# Patient Record
Sex: Female | Born: 1955
Health system: Southern US, Community
[De-identification: ages and names within clinical notes are randomized; demographics above are authoritative.]

## PROBLEM LIST (undated history)

## (undated) DIAGNOSIS — T7840XA Allergy, unspecified, initial encounter: Secondary | ICD-10-CM

## (undated) DIAGNOSIS — K5090 Crohn's disease, unspecified, without complications: Secondary | ICD-10-CM

## (undated) DIAGNOSIS — F329 Major depressive disorder, single episode, unspecified: Secondary | ICD-10-CM

## (undated) DIAGNOSIS — C44211 Basal cell carcinoma of skin of unspecified ear and external auricular canal: Secondary | ICD-10-CM

## (undated) DIAGNOSIS — F909 Attention-deficit hyperactivity disorder, unspecified type: Secondary | ICD-10-CM

## (undated) DIAGNOSIS — K529 Noninfective gastroenteritis and colitis, unspecified: Secondary | ICD-10-CM

## (undated) DIAGNOSIS — F32A Depression, unspecified: Secondary | ICD-10-CM

## (undated) DIAGNOSIS — K449 Diaphragmatic hernia without obstruction or gangrene: Secondary | ICD-10-CM

## (undated) HISTORY — PX: COLONOSCOPY: SHX174

## (undated) HISTORY — DX: Crohn's disease, unspecified, without complications: K50.90

## (undated) HISTORY — PX: BREAST EXCISIONAL BIOPSY: SUR124

## (undated) HISTORY — DX: Diaphragmatic hernia without obstruction or gangrene: K44.9

## (undated) HISTORY — PX: ABDOMINAL HYSTERECTOMY: SHX81

## (undated) HISTORY — DX: Basal cell carcinoma of skin of unspecified ear and external auricular canal: C44.211

## (undated) HISTORY — DX: Noninfective gastroenteritis and colitis, unspecified: K52.9

## (undated) HISTORY — PX: BREAST SURGERY: SHX581

## (undated) HISTORY — PX: TONSILLECTOMY: SUR1361

## (undated) HISTORY — DX: Allergy, unspecified, initial encounter: T78.40XA

## (undated) HISTORY — PX: KNEE SURGERY: SHX244

---

## 2011-11-15 DIAGNOSIS — E785 Hyperlipidemia, unspecified: Secondary | ICD-10-CM | POA: Insufficient documentation

## 2011-12-08 DIAGNOSIS — M25562 Pain in left knee: Secondary | ICD-10-CM | POA: Insufficient documentation

## 2012-06-20 DIAGNOSIS — M778 Other enthesopathies, not elsewhere classified: Secondary | ICD-10-CM | POA: Insufficient documentation

## 2012-10-01 DIAGNOSIS — Z9889 Other specified postprocedural states: Secondary | ICD-10-CM | POA: Insufficient documentation

## 2012-12-05 DIAGNOSIS — M792 Neuralgia and neuritis, unspecified: Secondary | ICD-10-CM | POA: Insufficient documentation

## 2012-12-05 DIAGNOSIS — F1190 Opioid use, unspecified, uncomplicated: Secondary | ICD-10-CM | POA: Insufficient documentation

## 2013-05-30 DIAGNOSIS — N3941 Urge incontinence: Secondary | ICD-10-CM | POA: Insufficient documentation

## 2013-11-19 DIAGNOSIS — M25531 Pain in right wrist: Secondary | ICD-10-CM | POA: Insufficient documentation

## 2014-08-04 DIAGNOSIS — M11279 Other chondrocalcinosis, unspecified ankle and foot: Secondary | ICD-10-CM | POA: Insufficient documentation

## 2014-09-01 LAB — HM HEPATITIS C SCREENING LAB: HM Hepatitis Screen: NEGATIVE

## 2014-09-01 LAB — HIV ANTIBODY (ROUTINE TESTING W REFLEX): HIV 1&2 Ab, 4th Generation: NONREACTIVE

## 2015-03-15 ENCOUNTER — Encounter (HOSPITAL_COMMUNITY): Payer: Self-pay | Admitting: Emergency Medicine

## 2015-03-15 ENCOUNTER — Emergency Department (HOSPITAL_COMMUNITY)
Admission: EM | Admit: 2015-03-15 | Discharge: 2015-03-15 | Disposition: A | Discharge status: Home / Self Care | Payer: BLUE CROSS/BLUE SHIELD | Attending: Emergency Medicine | Admitting: Emergency Medicine

## 2015-03-15 DIAGNOSIS — G43909 Migraine, unspecified, not intractable, without status migrainosus: Secondary | ICD-10-CM | POA: Insufficient documentation

## 2015-03-15 DIAGNOSIS — Z720 Tobacco use: Secondary | ICD-10-CM | POA: Insufficient documentation

## 2015-03-15 DIAGNOSIS — Z8659 Personal history of other mental and behavioral disorders: Secondary | ICD-10-CM | POA: Diagnosis not present

## 2015-03-15 DIAGNOSIS — G43009 Migraine without aura, not intractable, without status migrainosus: Secondary | ICD-10-CM

## 2015-03-15 DIAGNOSIS — R51 Headache: Secondary | ICD-10-CM | POA: Diagnosis present

## 2015-03-15 HISTORY — DX: Depression, unspecified: F32.A

## 2015-03-15 HISTORY — DX: Major depressive disorder, single episode, unspecified: F32.9

## 2015-03-15 MED ORDER — KETOROLAC TROMETHAMINE 30 MG/ML IJ SOLN
30.0000 mg | Freq: Once | INTRAMUSCULAR | Status: AC
  Administered 2015-03-15: 30 mg via INTRAVENOUS
  Filled 2015-03-15: qty 1

## 2015-03-15 MED ORDER — METOCLOPRAMIDE HCL 5 MG/ML IJ SOLN
10.0000 mg | Freq: Once | INTRAMUSCULAR | Status: AC
  Administered 2015-03-15: 10 mg via INTRAVENOUS
  Filled 2015-03-15: qty 2

## 2015-03-15 MED ORDER — METOCLOPRAMIDE HCL 10 MG PO TABS: 10.0000 mg | ORAL_TABLET | Freq: Four times a day (QID) | ORAL | Status: DC | PRN

## 2015-03-15 MED ORDER — DIPHENHYDRAMINE HCL 50 MG/ML IJ SOLN
12.5000 mg | Freq: Once | INTRAMUSCULAR | Status: AC
  Administered 2015-03-15: 12.5 mg via INTRAVENOUS
  Filled 2015-03-15: qty 1

## 2015-03-15 NOTE — Discharge Instructions (Signed)
Migraine Headache A migraine headache is very bad, throbbing pain on one or both sides of your head. Talk to your doctor about what things may bring on (trigger) your migraine headaches. HOME CARE  Only take medicines as told by your doctor.  Lie down in a dark, quiet room when you have a migraine.  Keep a journal to find out if certain things bring on migraine headaches. For example, write down:  What you eat and drink.  How much sleep you get.  Any change to your diet or medicines.  Lessen how much alcohol you drink.  Quit smoking if you smoke.  Get enough sleep.  Lessen any stress in your life.  Keep lights dim if bright lights bother you or make your migraines worse. GET HELP RIGHT AWAY IF:   Your migraine becomes really bad.  You have a fever.  You have a stiff neck.  You have trouble seeing.  Your muscles are weak, or you lose muscle control.  You lose your balance or have trouble walking.  You feel like you will pass out (faint), or you pass out.  You have really bad symptoms that are different than your first symptoms. MAKE SURE YOU:   Understand these instructions.  Will watch your condition.  Will get help right away if you are not doing well or get worse. Document Released: 05/17/2008 Document Revised: 10/31/2011 Document Reviewed: 04/15/2013 [facility] Patient Information 2015 [place], [place]. This information is not intended to replace advice given to you by your health care provider. Make sure you discuss any questions you have with your health care provider. You've been given prescriptions for Reglan, you can take this at the start of a headache along with 600 mg of ibuprofen and 25 mg Benadryl.  These are all the medications we've given you in the emergency department, with total resolution of your headache. If your headaches are starting to become more frequent or regular.  Please speak with your primary care physician about being put on a  prophylactic medication to prevent headaches

## 2015-03-15 NOTE — ED Provider Notes (Signed)
CSN: [id number]     Arrival date & time 03/15/15  2028 History   First MD Initiated Contact with Patient 03/15/15 2028     Chief Complaint  Patient presents with  . Migraine     (Consider location/radiation/quality/duration/timing/severity/associated sxs/prior Treatment) HPI Comments: Patient states she has a history of migraines, not frequently.  Last was approximately a week ago.  The time before that was approximately 6 months ago.  Her primary care physician gave her Urised to take for the pain.  She states she took one this morning and 1 approximately 4 PM she continues to have nausea, vomiting and abdominal discomfort.  She states the abdominal discomfort started after she started vomiting.  She does have a history of seasonal allergies that are very mild She states the pain is frontal bilaterally which is typical pain area for her and she is photophobic.  Denies any weakness of extremities  Patient is a 59 y.o. female presenting with migraines. The history is provided by the patient.  Migraine This is a recurrent problem. The current episode started yesterday. The problem occurs constantly. The problem has been unchanged. Associated symptoms include abdominal pain, headaches, nausea and vomiting. Pertinent negatives include no chills, congestion, coughing, fatigue, fever, neck pain, numbness, rash, sore throat, swollen glands or weakness. Exacerbated by: light. Treatments tried: Fioricet. The treatment provided no relief.    Past Medical History  Diagnosis Date  . Depression .   Past Surgical History  Procedure Laterality Date  . Cesarean section    . Abdominal hysterectomy    . Knee surgery     No family history on file. History  Substance Use Topics  . Smoking status: Current Some Day Smoker  . Smokeless tobacco: Not on file  . Alcohol Use: Yes     Comment: wine at night   OB History    No data available     Review of Systems  Constitutional: Negative for fever,  chills and fatigue.  HENT: Negative for congestion, facial swelling, rhinorrhea, sinus pressure and sore throat.   Respiratory: Negative for cough.   Gastrointestinal: Positive for nausea, vomiting and abdominal pain. Negative for diarrhea.  Musculoskeletal: Negative for back pain and neck pain.  Skin: Negative for color change and rash.  Neurological: Positive for headaches. Negative for dizziness, seizures, speech difficulty, weakness and numbness.  All other systems reviewed and are negative.     Allergies  Naproxen and Zanaflex  Home Medications   Prior to Admission medications   Medication Sig Start Date End Date Taking? Authorizing Provider  metoCLOPramide (REGLAN) 10 MG tablet Take 1 tablet (10 mg total) by mouth every 6 (six) hours as needed for nausea (at start of headache). 03/15/15   Junius Creamer, NP   BP 168/86 mmHg  Pulse 84  Temp(Src) 97.5 F (36.4 C) (Oral)  Resp 18  Ht 5\' 4"  (1.626 m)  Wt 140 lb (63.504 kg)  BMI 24.02 kg/m2  SpO2 100% Physical Exam  Constitutional: She is oriented to person, place, and time. She appears well-developed and well-nourished.  HENT:  Head: Normocephalic.  Right Ear: External ear normal.  Left Ear: External ear normal.  Eyes: EOM are normal. Pupils are equal, round, and reactive to light. Right eye exhibits no nystagmus. Left eye exhibits no nystagmus.  Neck: Normal range of motion.  Cardiovascular: Normal rate and regular rhythm.   Pulmonary/Chest: Effort normal and breath sounds normal.  Abdominal: Soft.  Musculoskeletal: Normal range of motion.  Neurological: She  is alert and oriented to person, place, and time. No cranial nerve deficit.  Skin: Skin is warm.  Nursing note and vitals reviewed.   ED Course  Procedures (including critical care time) Labs Review Labs Reviewed - No data to display  Imaging Review No results found.   EKG Interpretation None     Patient states her headache has totally resolved.  She will  be given.  The rest of her IV fluids due to the number of times she vomited throughout the day, but she is feeling back to her baseline.  She is no  longer having abdominal discomfort MDM   Final diagnoses:  Migraine without aura and without status migrainosus, not intractable         Junius Creamer, NP 03/15/15 2156  Sherwood Gambler, MD 03/18/15 [phone]

## 2015-03-15 NOTE — ED Notes (Signed)
Per EMS: new to area from [place].  Migraine started yesterday.  Dry heaving and spitting up at scene with ems, gave 4 zofran pta.  18 gauge left ac.  HTN, hx of migraines, takes meds for migraines, patient wont specify what the medication is.  NSR

## 2015-08-03 DIAGNOSIS — M79642 Pain in left hand: Secondary | ICD-10-CM

## 2015-08-03 DIAGNOSIS — M79641 Pain in right hand: Secondary | ICD-10-CM | POA: Insufficient documentation

## 2015-08-06 DIAGNOSIS — G5601 Carpal tunnel syndrome, right upper limb: Secondary | ICD-10-CM | POA: Insufficient documentation

## 2015-10-13 ENCOUNTER — Other Ambulatory Visit: Payer: Self-pay

## 2015-10-13 DIAGNOSIS — Z1231 Encounter for screening mammogram for malignant neoplasm of breast: Secondary | ICD-10-CM

## 2015-10-30 ENCOUNTER — Ambulatory Visit
Admission: RE | Admit: 2015-10-30 | Discharge: 2015-10-30 | Disposition: A | Discharge status: Home / Self Care | Payer: BLUE CROSS/BLUE SHIELD | Source: Ambulatory Visit

## 2015-10-30 DIAGNOSIS — Z1231 Encounter for screening mammogram for malignant neoplasm of breast: Secondary | ICD-10-CM

## 2017-08-30 LAB — HEPATIC FUNCTION PANEL
ALT: 25 (ref 7–35)
AST: 27 (ref 13–35)
Alkaline Phosphatase: 106 (ref 25–125)
Bilirubin, Total: 0.4

## 2017-08-30 LAB — BASIC METABOLIC PANEL
BUN: 11 (ref 4–21)
Creatinine: 0.8 (ref <=1.1)
Glucose: 87
Potassium: 4.8 (ref 3.4–5.3)
Sodium: 140 (ref 137–147)

## 2017-08-30 LAB — LIPID PANEL
Cholesterol: 328 — AB (ref 0–200)
HDL: 84 — AB (ref 35–70)
LDL Cholesterol: 193
Triglycerides: 256 — AB (ref 40–160)

## 2017-08-30 LAB — CBC AND DIFFERENTIAL
HCT: 38 (ref 36–46)
Hemoglobin: 12.8 (ref 12.0–16.0)
Platelets: 298 (ref 150–399)
WBC: 8

## 2017-08-30 LAB — VITAMIN D 25 HYDROXY (VIT D DEFICIENCY, FRACTURES): Vit D, 25-Hydroxy: 19

## 2017-08-30 LAB — VITAMIN B12: Vitamin B-12: 382

## 2017-08-30 LAB — TSH: TSH: 3.2 (ref <=5.90)

## 2017-08-31 ENCOUNTER — Emergency Department (HOSPITAL_COMMUNITY)
Admission: EM | Admit: 2017-08-31 | Discharge: 2017-08-31 | Disposition: A | Discharge status: Home / Self Care | Payer: 59 | Attending: Emergency Medicine | Admitting: Emergency Medicine

## 2017-08-31 ENCOUNTER — Encounter (HOSPITAL_COMMUNITY): Payer: Self-pay

## 2017-08-31 DIAGNOSIS — R109 Unspecified abdominal pain: Secondary | ICD-10-CM | POA: Diagnosis present

## 2017-08-31 DIAGNOSIS — Z5321 Procedure and treatment not carried out due to patient leaving prior to being seen by health care provider: Secondary | ICD-10-CM | POA: Insufficient documentation

## 2017-08-31 LAB — URINALYSIS, ROUTINE W REFLEX MICROSCOPIC
Bilirubin Urine: NEGATIVE
Glucose, UA: NEGATIVE mg/dL
Hgb urine dipstick: NEGATIVE
KETONES UR: NEGATIVE mg/dL
LEUKOCYTES UA: NEGATIVE
Nitrite: NEGATIVE
PH: 5 (ref 5.0–8.0)
Protein, ur: NEGATIVE mg/dL
SPECIFIC GRAVITY, URINE: 1.005 (ref 1.005–1.030)

## 2017-08-31 LAB — COMPREHENSIVE METABOLIC PANEL
ALT: 23 U/L (ref 14–54)
AST: 24 U/L (ref 15–41)
Albumin: 4.3 g/dL (ref 3.5–5.0)
Alkaline Phosphatase: 94 U/L (ref 38–126)
Anion gap: 10 (ref 5–15)
BILIRUBIN TOTAL: 0.8 mg/dL (ref 0.3–1.2)
BUN: 6 mg/dL (ref 6–20)
CO2: 24 mmol/L (ref 22–32)
Calcium: 9.4 mg/dL (ref 8.9–10.3)
Chloride: 101 mmol/L (ref 101–111)
Creatinine, Ser: 0.86 mg/dL (ref 0.44–1.00)
GFR calc Af Amer: >60 mL/min (ref >60)
Glucose, Bld: 107 mg/dL — ABNORMAL HIGH (ref 65–99)
POTASSIUM: 4 mmol/L (ref 3.5–5.1)
Sodium: 135 mmol/L (ref 135–145)
Total Protein: 8 g/dL (ref 6.5–8.1)

## 2017-08-31 LAB — CBC
HEMATOCRIT: 40.1 % (ref 36.0–46.0)
Hemoglobin: 13.2 g/dL (ref 12.0–15.0)
MCH: 30.7 pg (ref 26.0–34.0)
MCHC: 32.9 g/dL (ref 30.0–36.0)
MCV: 93.3 fL (ref 78.0–100.0)
Platelets: 266 10*3/uL (ref 150–400)
RBC: 4.3 MIL/uL (ref 3.87–5.11)
RDW: 13 % (ref 11.5–15.5)
WBC: 7.7 10*3/uL (ref 4.0–10.5)

## 2017-08-31 LAB — LIPASE, BLOOD: Lipase: 26 U/L (ref 11–51)

## 2017-08-31 NOTE — ED Notes (Signed)
Per registration pt std they are unable to wait any longer and left.

## 2017-08-31 NOTE — ED Triage Notes (Signed)
Per Pt, Pt was seen by PCP yesterday and were she had reported some intermittent abdominal pain, but had fel good for the last couple days. Last night, pt started to have worsening abdominal pain. Denies N/V/D or blood in her stool.

## 2017-08-31 NOTE — ED Notes (Signed)
Reports that MD reported he thought it was a stomach ulcer.

## 2017-10-09 LAB — HM COLONOSCOPY

## 2018-01-01 ENCOUNTER — Encounter: Payer: Self-pay | Admitting: Emergency Medicine

## 2018-01-01 ENCOUNTER — Emergency Department
Admission: EM | Admit: 2018-01-01 | Discharge: 2018-01-01 | Disposition: A | Discharge status: Home / Self Care | Payer: 59 | Attending: Emergency Medicine | Admitting: Emergency Medicine

## 2018-01-01 ENCOUNTER — Emergency Department: Payer: 59

## 2018-01-01 ENCOUNTER — Other Ambulatory Visit: Payer: Self-pay

## 2018-01-01 DIAGNOSIS — F17200 Nicotine dependence, unspecified, uncomplicated: Secondary | ICD-10-CM | POA: Insufficient documentation

## 2018-01-01 DIAGNOSIS — R1010 Upper abdominal pain, unspecified: Secondary | ICD-10-CM | POA: Diagnosis present

## 2018-01-01 LAB — COMPREHENSIVE METABOLIC PANEL
ALK PHOS: 97 U/L (ref 38–126)
ALT: 28 U/L (ref 14–54)
AST: 34 U/L (ref 15–41)
Albumin: 4.5 g/dL (ref 3.5–5.0)
Anion gap: 7 (ref 5–15)
BILIRUBIN TOTAL: 0.4 mg/dL (ref 0.3–1.2)
BUN: 13 mg/dL (ref 6–20)
CALCIUM: 8.9 mg/dL (ref 8.9–10.3)
CO2: 26 mmol/L (ref 22–32)
CREATININE: 0.7 mg/dL (ref 0.44–1.00)
Chloride: 102 mmol/L (ref 101–111)
Glucose, Bld: 118 mg/dL — ABNORMAL HIGH (ref 65–99)
Potassium: 4.3 mmol/L (ref 3.5–5.1)
SODIUM: 135 mmol/L (ref 135–145)
TOTAL PROTEIN: 8.1 g/dL (ref 6.5–8.1)

## 2018-01-01 LAB — CBC
HCT: 36.8 % (ref 35.0–47.0)
Hemoglobin: 13 g/dL (ref 12.0–16.0)
MCH: 33 pg (ref 26.0–34.0)
MCHC: 35.2 g/dL (ref 32.0–36.0)
MCV: 93.6 fL (ref 80.0–100.0)
PLATELETS: 327 10*3/uL (ref 150–440)
RBC: 3.94 MIL/uL (ref 3.80–5.20)
RDW: 13.7 % (ref 11.5–14.5)
WBC: 7.1 10*3/uL (ref 3.6–11.0)

## 2018-01-01 LAB — URINALYSIS, COMPLETE (UACMP) WITH MICROSCOPIC
Bacteria, UA: NONE SEEN
Bilirubin Urine: NEGATIVE
GLUCOSE, UA: NEGATIVE mg/dL
Hgb urine dipstick: NEGATIVE
Ketones, ur: NEGATIVE mg/dL
Leukocytes, UA: NEGATIVE
NITRITE: NEGATIVE
Protein, ur: NEGATIVE mg/dL
Specific Gravity, Urine: 1.016 (ref 1.005–1.030)
pH: 5 (ref 5.0–8.0)

## 2018-01-01 LAB — LIPASE, BLOOD: Lipase: 29 U/L (ref 11–51)

## 2018-01-01 MED ORDER — HYDROCODONE-ACETAMINOPHEN 5-325 MG PO TABS: 1.0000 | ORAL_TABLET | ORAL | 0 refills | Status: DC | PRN

## 2018-01-01 MED ORDER — ONDANSETRON HCL 4 MG/2ML IJ SOLN
4.0000 mg | Freq: Once | INTRAMUSCULAR | Status: AC
Start: 2018-01-01 — End: 2018-01-01
  Administered 2018-01-01: 4 mg via INTRAVENOUS
  Filled 2018-01-01: qty 2

## 2018-01-01 MED ORDER — MORPHINE SULFATE (PF) 4 MG/ML IV SOLN
4.0000 mg | Freq: Once | INTRAVENOUS | Status: AC
  Administered 2018-01-01: 4 mg via INTRAVENOUS
  Filled 2018-01-01: qty 1

## 2018-01-01 MED ORDER — IOPAMIDOL (ISOVUE-300) INJECTION 61%
30.0000 mL | Freq: Once | INTRAVENOUS | Status: AC | PRN
  Administered 2018-01-01: 30 mL via ORAL

## 2018-01-01 MED ORDER — IOPAMIDOL (ISOVUE-370) INJECTION 76%
75.0000 mL | Freq: Once | INTRAVENOUS | Status: AC | PRN
  Administered 2018-01-01: 75 mL via INTRAVENOUS

## 2018-01-01 NOTE — ED Notes (Signed)
Pt discharged home after verbalizing understanding of discharge instructions; nad noted. 

## 2018-01-01 NOTE — ED Notes (Addendum)
Pt walked to toilet with no difficulty; states her pain is increasing to 6/10. Pt given message to call her husband.

## 2018-01-01 NOTE — ED Provider Notes (Signed)
[facility] Emergency Department Provider Note   ____________________________________________    I have reviewed the triage vital signs and the nursing notes.   HISTORY  Chief Complaint Abdominal Pain     HPI Hailey Galvan is a 62 y.o. female who presents with complaints of abdominal pain.  Patient describes primarily upper abdominal pain which is been ongoing for the last day.  She reports she has been having this problem intermittently over the last 2 months.  She has had an ultrasound of her gallbladder including a nuclear medicine test of her gallbladder both of which were normal.  She has had an endoscopy and colonoscopy which were unremarkable.  She denies fevers or chills.  No nausea or vomiting.  No diarrhea.  Took over-the-counter medications without improvement.   Past Medical History:  Diagnosis Date  . Depression .    There are no active problems to display for this patient.   Past Surgical History:  Procedure Laterality Date  . ABDOMINAL HYSTERECTOMY    . BREAST SURGERY     Tumor Removal  . CESAREAN SECTION    . KNEE SURGERY    . TONSILLECTOMY      Prior to Admission medications   Medication Sig Start Date End Date Taking? Authorizing Provider  HYDROcodone-acetaminophen (NORCO/VICODIN) 5-325 MG tablet Take 1 tablet by mouth every 4 (four) hours as needed for moderate pain. 01/01/18   Lavonia Drafts, MD  metoCLOPramide (REGLAN) 10 MG tablet Take 1 tablet (10 mg total) by mouth every 6 (six) hours as needed for nausea (at start of headache). 03/15/15   Junius Creamer, NP     Allergies Naproxen and Zanaflex [tizanidine hcl]  No family history on file.  Social History Social History   Tobacco Use  . Smoking status: Current Some Day Smoker  . Smokeless tobacco: Never Used  Substance Use Topics  . Alcohol use: Yes    Comment: wine at night  . Drug use: Not on file    Review of Systems  Constitutional: No  fever/chills Eyes: No visual changes.  ENT: No sore throat. Cardiovascular: Denies chest pain. Respiratory: Denies shortness of breath. Gastrointestinal: As above Genitourinary: Negative for dysuria. Musculoskeletal: Negative for back pain. Skin: Negative for rash. Neurological: Negative for headaches or weakness   ____________________________________________   PHYSICAL EXAM:  VITAL SIGNS: ED Triage Vitals [01/01/18 1131]  Enc Vitals Group     BP 126/79     Pulse Rate 84     Resp 18     Temp 98.4 F (36.9 C)     Temp Source Oral     SpO2 98 %     Weight 68 kg (150 lb)     Height 1.626 m (5\' 4" )     Head Circumference      Peak Flow      Pain Score 7     Pain Loc      Pain Edu?      Excl. in [place]?     Constitutional: Alert and oriented.  Tearful and anxious Eyes: Conjunctivae are normal.  Head: Atraumatic. Nose: No congestion/rhinnorhea. Mouth/Throat: Mucous membranes are moist.    Cardiovascular: Normal rate, regular rhythm. Grossly normal heart sounds.  Good peripheral circulation. Respiratory: Normal respiratory effort.  No retractions. Lungs CTAB. Gastrointestinal: Soft and nontender. No distention.  No CVA tenderness.  Reassuring exam  Musculoskeletal:  Warm and well perfused Neurologic:  Normal speech and language. No gross focal neurologic deficits are appreciated.  Skin:  Skin is warm, dry and intact. No rash noted. Psychiatric: Mood and affect are normal. Speech and behavior are normal.  ____________________________________________   LABS (all labs ordered are listed, but only abnormal results are displayed)  Labs Reviewed  COMPREHENSIVE METABOLIC PANEL - Abnormal; Notable for the following components:      Result Value   Glucose, Bld 118 (*)    All other components within normal limits  URINALYSIS, COMPLETE (UACMP) WITH MICROSCOPIC - Abnormal; Notable for the following components:   Color, Urine YELLOW (*)    APPearance CLEAR (*)    All other  components within normal limits  LIPASE, BLOOD  CBC   ____________________________________________  EKG   ____________________________________________  RADIOLOGY  CT abdomen pelvis negative for acute abnormality, fatty liver noted discussed with patient ____________________________________________   PROCEDURES  Procedure(s) performed: No  Procedures   Critical Care performed: No ____________________________________________   INITIAL IMPRESSION / ASSESSMENT AND PLAN / ED COURSE  Pertinent labs & imaging results that were available during my care of the patient were reviewed by me and considered in my medical decision making (see chart for details).  Patient presents with intermittent abdominal pain over the last 2 months, appears to be constant over the last 24 hours.  She has had normal gallbladder studies, normal endoscopy.  We will give IV morphine, IV Zofran and obtain CT abdomen pelvis  Patient CT scan is reassuring.  After morphine she had significant improvement in pain.  She is greatly reassured by CT scan.  Appropriate for discharge at this time with continued follow-up with gastroenterology.  Return precautions discussed    ____________________________________________   FINAL CLINICAL IMPRESSION(S) / ED DIAGNOSES  Final diagnoses:  Pain of upper abdomen        Note:  This document was prepared using Dragon voice recognition software and may include unintentional dictation errors.    Lavonia Drafts, MD 01/01/18 1520

## 2018-01-01 NOTE — ED Notes (Signed)
Pt presents with diffuse abdominal pain x 2 months. She states that she has had numerous tests with no diagnosis, and she is still having "episodes." She reports that this episode has been going on x 2 days. Pt tearful during assessment.

## 2018-01-01 NOTE — ED Triage Notes (Signed)
Pt states intermittent abdominal pain for the past few months, has had numerous tests all being negative, states her pain has stayed longer now than normal, appears in NAD.

## 2018-01-03 ENCOUNTER — Other Ambulatory Visit: Payer: Self-pay

## 2018-01-03 ENCOUNTER — Emergency Department: Payer: No Typology Code available for payment source

## 2018-01-03 ENCOUNTER — Emergency Department
Admission: EM | Admit: 2018-01-03 | Discharge: 2018-01-03 | Disposition: A | Discharge status: Home / Self Care | Payer: No Typology Code available for payment source | Attending: Emergency Medicine | Admitting: Emergency Medicine

## 2018-01-03 DIAGNOSIS — N390 Urinary tract infection, site not specified: Secondary | ICD-10-CM | POA: Diagnosis not present

## 2018-01-03 DIAGNOSIS — F17200 Nicotine dependence, unspecified, uncomplicated: Secondary | ICD-10-CM | POA: Insufficient documentation

## 2018-01-03 DIAGNOSIS — K529 Noninfective gastroenteritis and colitis, unspecified: Secondary | ICD-10-CM

## 2018-01-03 DIAGNOSIS — R1031 Right lower quadrant pain: Secondary | ICD-10-CM | POA: Diagnosis present

## 2018-01-03 LAB — COMPREHENSIVE METABOLIC PANEL
ALT: 23 U/L (ref 14–54)
AST: 34 U/L (ref 15–41)
Albumin: 4.2 g/dL (ref 3.5–5.0)
Alkaline Phosphatase: 93 U/L (ref 38–126)
Anion gap: 10 (ref 5–15)
BUN: 11 mg/dL (ref 6–20)
CHLORIDE: 101 mmol/L (ref 101–111)
CO2: 21 mmol/L — ABNORMAL LOW (ref 22–32)
Calcium: 9.1 mg/dL (ref 8.9–10.3)
Creatinine, Ser: 0.72 mg/dL (ref 0.44–1.00)
GFR calc Af Amer: >60 mL/min (ref >60)
Glucose, Bld: 142 mg/dL — ABNORMAL HIGH (ref 65–99)
Potassium: 4 mmol/L (ref 3.5–5.1)
Sodium: 132 mmol/L — ABNORMAL LOW (ref 135–145)
Total Bilirubin: 0.5 mg/dL (ref 0.3–1.2)
Total Protein: 7.7 g/dL (ref 6.5–8.1)

## 2018-01-03 LAB — CBC
HCT: 36.3 % (ref 35.0–47.0)
Hemoglobin: 12.5 g/dL (ref 12.0–16.0)
MCH: 32.7 pg (ref 26.0–34.0)
MCHC: 34.5 g/dL (ref 32.0–36.0)
MCV: 94.8 fL (ref 80.0–100.0)
Platelets: 319 10*3/uL (ref 150–440)
RBC: 3.83 MIL/uL (ref 3.80–5.20)
RDW: 13.6 % (ref 11.5–14.5)
WBC: 10.3 10*3/uL (ref 3.6–11.0)

## 2018-01-03 LAB — URINALYSIS, COMPLETE (UACMP) WITH MICROSCOPIC
BILIRUBIN URINE: NEGATIVE
Glucose, UA: NEGATIVE mg/dL
Hgb urine dipstick: NEGATIVE
KETONES UR: NEGATIVE mg/dL
LEUKOCYTES UA: NEGATIVE
Nitrite: NEGATIVE
Protein, ur: 30 mg/dL — AB
Specific Gravity, Urine: 1.03 (ref 1.005–1.030)
pH: 5 (ref 5.0–8.0)

## 2018-01-03 LAB — LIPASE, BLOOD: LIPASE: 26 U/L (ref 11–51)

## 2018-01-03 MED ORDER — IOPAMIDOL (ISOVUE-370) INJECTION 76%
75.0000 mL | Freq: Once | INTRAVENOUS | Status: AC | PRN
  Administered 2018-01-03: 75 mL via INTRAVENOUS
  Filled 2018-01-03: qty 75

## 2018-01-03 MED ORDER — MORPHINE SULFATE (PF) 4 MG/ML IV SOLN
4.0000 mg | Freq: Once | INTRAVENOUS | Status: AC
  Administered 2018-01-03: 4 mg via INTRAVENOUS
  Filled 2018-01-03: qty 1

## 2018-01-03 MED ORDER — AMOXICILLIN-POT CLAVULANATE 875-125 MG PO TABS: 1.0000 | ORAL_TABLET | Freq: Two times a day (BID) | ORAL | 0 refills | Status: AC

## 2018-01-03 MED ORDER — ONDANSETRON HCL 4 MG PO TABS: 4.0000 mg | ORAL_TABLET | Freq: Three times a day (TID) | ORAL | 0 refills | Status: DC | PRN

## 2018-01-03 MED ORDER — IOPAMIDOL (ISOVUE-300) INJECTION 61%
30.0000 mL | Freq: Once | INTRAVENOUS | Status: AC | PRN
  Administered 2018-01-03: 30 mL via ORAL
  Filled 2018-01-03: qty 30

## 2018-01-03 MED ORDER — AMOXICILLIN-POT CLAVULANATE 875-125 MG PO TABS
1.0000 | ORAL_TABLET | Freq: Once | ORAL | Status: AC
  Administered 2018-01-03: 1 via ORAL
  Filled 2018-01-03 (×2): qty 1

## 2018-01-03 MED ORDER — HYDROCODONE-ACETAMINOPHEN 5-325 MG PO TABS: 1.0000 | ORAL_TABLET | ORAL | 0 refills | Status: DC | PRN

## 2018-01-03 NOTE — ED Provider Notes (Signed)
[facility] Emergency Department Provider Note ____________________________________________   First MD Initiated Contact with Patient 01/03/18 1729     (approximate)  I have reviewed the triage vital signs and the nursing notes.   HISTORY  Chief Complaint Abdominal Pain    HPI Hailey Galvan is a 62 y.o. female with PMH as noted below who presents with abdominal pain over the last 4 days, mainly in the lower abdomen and towards the right side, constant, associated with a few episodes of retching but no persistent nausea or vomiting, as well as with constipation.  She reports some mild difficulty urinating but no dysuria or hematuria.  No fever or chills.  The patient reports that she has had some intermittent abdominal pain over the last few months for which she has had work-up by GI including right upper quadrant ultrasound, upper endoscopy, and colonoscopy which were negative.  She states that the chronic pain is brief and lasts for a few hours, but this pain over the last several days has been constant.  She was seen in the ED 2 days ago for same and had negative imaging, but states pain has persisted and worsened.  Past Medical History:  Diagnosis Date  . Depression .    There are no active problems to display for this patient.   Past Surgical History:  Procedure Laterality Date  . ABDOMINAL HYSTERECTOMY    . BREAST SURGERY     Tumor Removal  . CESAREAN SECTION    . KNEE SURGERY    . TONSILLECTOMY      Prior to Admission medications   Medication Sig Start Date End Date Taking? Authorizing Provider  HYDROcodone-acetaminophen (NORCO/VICODIN) 5-325 MG tablet Take 1 tablet by mouth every 4 (four) hours as needed for moderate pain. 01/01/18   Lavonia Drafts, MD  metoCLOPramide (REGLAN) 10 MG tablet Take 1 tablet (10 mg total) by mouth every 6 (six) hours as needed for nausea (at start of headache). 03/15/15   Junius Creamer, NP    Allergies Naproxen  and Zanaflex [tizanidine hcl]  No family history on file.  Social History Social History   Tobacco Use  . Smoking status: Current Some Day Smoker  . Smokeless tobacco: Never Used  Substance Use Topics  . Alcohol use: Yes    Comment: wine at night  . Drug use: Not on file    Review of Systems  Constitutional: No fever. Eyes: No redness. ENT: No sore throat. Cardiovascular: Denies chest pain. Respiratory: Denies shortness of breath. Gastrointestinal: No vomiting or diarrhea.  Genitourinary: Negative for dysuria or hematuria.  Musculoskeletal: Negative for back pain. Skin: Negative for rash. Neurological: Negative for headache.   ____________________________________________   PHYSICAL EXAM:  VITAL SIGNS: ED Triage Vitals  Enc Vitals Group     BP 01/03/18 1441 117/85     Pulse Rate 01/03/18 1441 94     Resp 01/03/18 1441 18     Temp 01/03/18 1441 98 F (36.7 C)     Temp Source 01/03/18 1441 Oral     SpO2 01/03/18 1441 98 %     Weight 01/03/18 1442 150 lb (68 kg)     Height 01/03/18 1442 5\' 4"  (1.626 m)     Head Circumference --      Peak Flow --      Pain Score 01/03/18 1442 8     Pain Loc --      Pain Edu? --      Excl. in [place]? --  Constitutional: Alert and oriented.  Relatively well appearing and in no acute distress. Eyes: Conjunctivae are normal.  No scleral icterus Head: Atraumatic. Nose: No congestion/rhinnorhea. Mouth/Throat: Mucous membranes are moist.   Neck: Normal range of motion.  Cardiovascular: Good peripheral circulation. Respiratory: Normal respiratory effort.   Gastrointestinal: Soft with mild suprapubic and right lower quadrant tenderness. No distention.  Genitourinary: No flank tenderness. Musculoskeletal: No lower extremity edema.  Extremities warm and well perfused.  Neurologic:  Normal speech and language. No gross focal neurologic deficits are appreciated.  Skin:  Skin is warm and dry. No rash noted. Psychiatric: Mood and affect  are normal. Speech and behavior are normal.  ____________________________________________   LABS (all labs ordered are listed, but only abnormal results are displayed)  Labs Reviewed  COMPREHENSIVE METABOLIC PANEL - Abnormal; Notable for the following components:      Result Value   Sodium 132 (*)    CO2 21 (*)    Glucose, Bld 142 (*)    All other components within normal limits  URINALYSIS, COMPLETE (UACMP) WITH MICROSCOPIC - Abnormal; Notable for the following components:   Color, Urine AMBER (*)    APPearance CLOUDY (*)    Protein, ur 30 (*)    Bacteria, UA MANY (*)    All other components within normal limits  LIPASE, BLOOD  CBC   ____________________________________________  EKG   ____________________________________________  RADIOLOGY  CT abdomen: Thickening of the terminal ileum consistent with ileitis.  Normal appendix.  ____________________________________________   PROCEDURES  Procedure(s) performed: No  Procedures  Critical Care performed: No ____________________________________________   INITIAL IMPRESSION / ASSESSMENT AND PLAN / ED COURSE  Pertinent labs & imaging results that were available during my care of the patient were reviewed by me and considered in my medical decision making (see chart for details).  62 year old female with PMH as noted above presents with persistent suprapubic and right lower quadrant abdominal pain over the last several days status post prior ED visit 2 days ago with negative work-up.  I reviewed past medical records in epic; the patient was most recently seen in the ED 2 days ago for the same complaint.  She had CT at that time which showed possible fatty infiltration of the terminal ileum, and did not visualize the appendix, but did not reveal any findings of acute appendicitis or other acute etiology of her pain.  Lab work-up was unremarkable.  Exam today is as described above.  Vital signs are normal, the patient is  relatively well-appearing.  There is some abdominal tenderness particularly in the right lower quadrant.  Overall differential is relatively broad, however given the findings on the CT 2 days ago, it includes ileitis, colitis, or appendicitis which may not have been adequately visualized on the previous CT.  Patient's UA is also consistent with possible UTI, which was not the case 2 days ago, so a cystitis/early pyelonephritis could certainly explain the patient's symptoms.  Plan: Repeat CT, and reassess.  If it is negative, I will treat for UTI.    ----------------------------------------- 8:28 PM on 01/03/2018 -----------------------------------------  CT shows findings consistent with terminal ileitis, with normal appendix.  Given that the patient's symptoms are relatively acute, as well as her age, acute ileitis from an infectious cause is more likely than IBD.  Patient's pain is well controlled at this time, and she feels well to go home.  I had an extensive discussion with the patient about the results of the work-up.  At this time  given the finding of possible terminal ileitis which could be infectious, as well as the findings of likely UTI, patient would likely benefit from treatment with an antibiotic.  I will prescribe Augmentin to most effectively cover for both sources with a single agent.  I gave the patient the return precautions, she expressed understanding.  ____________________________________________   FINAL CLINICAL IMPRESSION(S) / ED DIAGNOSES  Final diagnoses:  Ileitis  Urinary tract infection without hematuria, site unspecified      NEW MEDICATIONS STARTED DURING THIS VISIT:  New Prescriptions   No medications on file     Note:  This document was prepared using Dragon voice recognition software and may include unintentional dictation errors.    Arta Silence, MD 01/03/18 2030

## 2018-01-03 NOTE — ED Notes (Signed)
Pt presents with lower right abdominal pain that radiates to the back since Saturday. Pt states that she came into the ED previously for abdominal pain spasms but feels that this event is different than the others. Pt states that pain is "8/10." Pt states that her doctor told her that it could be appendicitis. AxOx4.

## 2018-01-03 NOTE — ED Triage Notes (Signed)
Right sided abdominal pain that began Saturday, seen in ER Monday for same. NVD. Pt alert and oriented X4, active, cooperative, pt in NAD. RR even and unlabored, color WNL.

## 2018-01-03 NOTE — Discharge Instructions (Addendum)
Your CT scan shows inflammation in the terminal ileum, or the end of your small intestine.  Your urinalysis also is consistent with a urinary tract infection.  Take the antibiotic as prescribed, starting tomorrow, and finish the full course.  Return to the ER for new, worsening, or persistent severe pain, vomiting or inability to take the medication, fevers, weakness, blood in the stool, or any other new or worsening symptoms that concern you.  You should follow-up with your regular primary care doctor.  We have provided a referral to a gastroenterologist affiliated with this hospital as well.

## 2018-01-18 DIAGNOSIS — R040 Epistaxis: Secondary | ICD-10-CM | POA: Insufficient documentation

## 2018-01-24 ENCOUNTER — Ambulatory Visit: Payer: 59 | Admitting: Gastroenterology

## 2018-01-24 ENCOUNTER — Other Ambulatory Visit: Payer: Self-pay

## 2018-01-24 ENCOUNTER — Encounter: Payer: Self-pay | Admitting: Gastroenterology

## 2018-01-24 VITALS — BP 102/65 | HR 71 | Ht 64.0 in | Wt 146.4 lb

## 2018-01-24 DIAGNOSIS — K529 Noninfective gastroenteritis and colitis, unspecified: Secondary | ICD-10-CM | POA: Diagnosis not present

## 2018-01-24 DIAGNOSIS — K5900 Constipation, unspecified: Secondary | ICD-10-CM | POA: Diagnosis not present

## 2018-01-24 NOTE — Patient Instructions (Signed)
High-Fiber Diet  Fiber, also called dietary fiber, is a type of carbohydrate found in fruits, vegetables, whole grains, and beans. A high-fiber diet can have many health benefits. Your health care provider may recommend a high-fiber diet to help:  · Prevent constipation. Fiber can make your bowel movements more regular.  · Lower your cholesterol.  · Relieve hemorrhoids, uncomplicated diverticulosis, or irritable bowel syndrome.  · Prevent overeating as part of a weight-loss plan.  · Prevent heart disease, type 2 diabetes, and certain cancers.    What is my plan?  The recommended daily intake of fiber includes:  · 38 grams for men under age 50.  · 30 grams for men over age 50.  · 25 grams for women under age 50.  · 21 grams for women over age 50.    You can get the recommended daily intake of dietary fiber by eating a variety of fruits, vegetables, grains, and beans. Your health care provider may also recommend a fiber supplement if it is not possible to get enough fiber through your diet.  What do I need to know about a high-fiber diet?  · Fiber supplements have not been widely studied for their effectiveness, so it is better to get fiber through food sources.  · Always check the fiber content on the nutrition facts label of any prepackaged food. Look for foods that contain at least 5 grams of fiber per serving.  · Ask your dietitian if you have questions about specific foods that are related to your condition, especially if those foods are not listed in the following section.  · Increase your daily fiber consumption gradually. Increasing your intake of dietary fiber too quickly may cause bloating, cramping, or gas.  · Drink plenty of water. Water helps you to digest fiber.  What foods can I eat?  Grains  Whole-grain breads. Multigrain cereal. Oats and oatmeal. Brown rice. Barley. Bulgur wheat. Millet. Bran muffins. Popcorn. Rye wafer crackers.  Vegetables   Sweet potatoes. Spinach. Kale. Artichokes. Cabbage. Broccoli. Green peas. Carrots. Squash.  Fruits  Berries. Pears. Apples. Oranges. Avocados. Prunes and raisins. Dried figs.  Meats and Other Protein Sources  Navy, kidney, pinto, and soy beans. Split peas. Lentils. Nuts and seeds.  Dairy  Fiber-fortified yogurt.  Beverages  Fiber-fortified soy milk. Fiber-fortified orange juice.  Other  Fiber bars.  The items listed above may not be a complete list of recommended foods or beverages. Contact your dietitian for more options.  What foods are not recommended?  Grains  White bread. Pasta made with refined flour. White rice.  Vegetables  Fried potatoes. Canned vegetables. Well-cooked vegetables.  Fruits  Fruit juice. Cooked, strained fruit.  Meats and Other Protein Sources  Fatty cuts of meat. Fried poultry or fried fish.  Dairy  Milk. Yogurt. Cream cheese. Sour cream.  Beverages  Soft drinks.  Other  Cakes and pastries. Butter and oils.  The items listed above may not be a complete list of foods and beverages to avoid. Contact your dietitian for more information.  What are some tips for including high-fiber foods in my diet?  · Eat a wide variety of high-fiber foods.  · Make sure that half of all grains consumed each day are whole grains.  · Replace breads and cereals made from refined flour or white flour with whole-grain breads and cereals.  · Replace white rice with brown rice, bulgur wheat, or millet.  · Start the day with a breakfast that is high in fiber,   such as a cereal that contains at least 5 grams of fiber per serving.  · Use beans in place of meat in soups, salads, or pasta.  · Eat high-fiber snacks, such as berries, raw vegetables, nuts, or popcorn.  This information is not intended to replace advice given to you by your health care provider. Make sure you discuss any questions you have with your health care provider.  Document Released: 08/08/2005 Document Revised: 01/14/2016 Document Reviewed: 01/21/2014   Elsevier Interactive Patient Education © 2018 [employer]

## 2018-01-24 NOTE — Progress Notes (Signed)
Hailey Bellows MD, MRCP(U.K) [address]  [place]  [place],  [address]  Main: [phone]  Fax: [phone]   Primary Care Physician: Peri Maris, MD  Primary Gastroenterologist:  Dr. Jonathon Galvan   No chief complaint on file.   HPI: Hailey Galvan is a 62 y.o. female   She has been referred for ileitis from the emergency room.  She presented to the emergency room on 01/03/2018 with abdominal pain of for 4 days duration.  At that point of time she had mentioned to the emergency room that she had had abdominal pain ongoing for a few months was seen by a gastroenterologist had an upper endoscopy colonoscopy which were negative along with  right upper quadrant ultrasound which were negative.  She had a CT scan of the abdomen in the emergency room which showed possible terminal ileitis, her urine was suggestive of a UTI.  The colitis was presumed to be infectious and hence they chose to treat both the colitis and the UTI with Augmentin to cover both.   Review of her past records suggest that she had a colonoscopy in February 2019 and one polyp was taken out.  Pathology returned as a tubular adenoma.  She had an upper endoscopy biopsies esophagus showed acute esophagitis.  There is some gastric polyps which were resected which demonstrated fundic gland polyps.  Random gastric biopsies showed no gross abnormality and were negative for H. pylori.  Duodenal biopsies were negative for any abnormalities.  At that time she also underwent a HIDA notion of the gallbladder.  Abdominal pain: Onset: initially episodic in 08/2017 , some nights would wake up with cramps, gradually getting worse. Presently she has it all the time, everytime she breathes  Site :all over the abdomen, more RUQ Radiation: localized  Severity :4-6/10  Petra Kuba of pain: "pinchy"  Aggravating factors: breathing  Relieving factors :nothing  Weight loss: 6 lbs last 3 weeks  NSAID use: none  PPI use :was on nexium but  started had some issues with her nose  Gall bladder surgery: none  Frequency of bowel movements: constipated most of the times, once in 3 days very hard, pain not relived with a bowel movement  Change in bowel movements: no  Relief with bowel movements: no  Gas/Bloating/Abdominal distension: no   No periods.   No family history of IBD, no joint pains , no eye symptoms , no skin rashes.        Current Outpatient Medications  Medication Sig Dispense Refill  . adapalene (DIFFERIN) 0.1 % cream Apply topically.    Marland Kitchen atorvastatin (LIPITOR) 10 MG tablet Take by mouth.    Marland Kitchen buPROPion (WELLBUTRIN XL) 300 MG 24 hr tablet TAKE 1 TABLET BY MOUTH EVERY DAY    . butalbital-acetaminophen-caffeine (FIORICET, ESGIC) 50-325-40 MG tablet TAKE 1 TABLET BY MOUTH EVERY 6 HOURS AS NEEDED FOR PAIN    . clindamycin (CLEOCIN T) 1 % SWAB Apply topically.    . clobetasol ointment (TEMOVATE) 0.05 % Apply topically.    . dicyclomine (BENTYL) 20 MG tablet     . estradiol (VIVELLE-DOT) 0.1 MG/24HR patch Place onto the skin.    Marland Kitchen FLUoxetine (PROZAC) 40 MG capsule TAKE 1 CAPSULE BY MOUTH EVERY DAY    . fluticasone (FLONASE) 50 MCG/ACT nasal spray USE 2 SPRAYS IN EACH NOSTRIL EVERY DAY    . gabapentin (NEURONTIN) 600 MG tablet   11  . HYDROcodone-acetaminophen (NORCO/VICODIN) 5-325 MG tablet Take 1 tablet by mouth every 4 (  four) hours as needed for severe pain. 20 tablet 0  . ibuprofen (ADVIL,MOTRIN) 800 MG tablet Take by mouth.    . lidocaine (XYLOCAINE) 2 % jelly Apply topically.    . metoCLOPramide (REGLAN) 10 MG tablet Take 1 tablet (10 mg total) by mouth every 6 (six) hours as needed for nausea (at start of headache). 10 tablet 0  . Minocycline HCl (ARESTIN) 1 MG MISC     . ondansetron (ZOFRAN) 4 MG tablet Take 1 tablet (4 mg total) by mouth every 8 (eight) hours as needed for nausea or vomiting. 15 tablet 0  . oxybutynin (DITROPAN-XL) 5 MG 24 hr tablet     . oxymorphone (OPANA) 10 MG tablet Take by mouth.      . ranitidine (ZANTAC) 150 MG tablet TAKE 1 TABLET BY MOUTH TWICE DAILY    . terbinafine (LAMISIL) 1 % cream Apply topically.    . tolterodine (DETROL LA) 4 MG 24 hr capsule Take by mouth.    . traZODone (DESYREL) 100 MG tablet TAKE 1 TABLET BY MOUTH ONCE DAILY NIGHTLY    . tretinoin (RETIN-A) 0.025 % cream Apply topically.    . triamcinolone cream (KENALOG) 0.1 % Apply topically.    . urea (CARMOL) 40 % CREA Apply to feet daily for dry, thick, dark skin.    Marland Kitchen vitamin B-12 (CYANOCOBALAMIN) 1000 MCG tablet Take by mouth.    . Vitamin D, Ergocalciferol, (DRISDOL) 50000 units CAPS capsule Take by mouth.     No current facility-administered medications for this visit.     Allergies as of 01/24/2018 - Review Complete 01/03/2018  Allergen Reaction Noted  . Etodolac Other (See Comments) 04/11/2012  . Naproxen  03/15/2015  . Tizanidine Other (See Comments) 11/15/2011  . Zanaflex [tizanidine hcl]  03/15/2015    ROS:  General: Negative for anorexia, weight loss, fever, chills, fatigue, weakness. ENT: Negative for hoarseness, difficulty swallowing , nasal congestion. CV: Negative for chest pain, angina, palpitations, dyspnea on exertion, peripheral edema.  Respiratory: Negative for dyspnea at rest, dyspnea on exertion, cough, sputum, wheezing.  GI: See history of present illness. GU:  Negative for dysuria, hematuria, urinary incontinence, urinary frequency, nocturnal urination.  Endo: Negative for unusual weight change.    Physical Examination:   There were no vitals taken for this visit.  General: Well-nourished, well-developed in no acute distress.  Eyes: No icterus. Conjunctivae pink. Mouth: Oropharyngeal mucosa moist and pink , no lesions erythema or exudate. Lungs: Clear to auscultation bilaterally. Non-labored. Heart: Regular rate and rhythm, no murmurs rubs or gallops.  Abdomen: Bowel sounds are normal, RLQ mild tenderness nondistended, no hepatosplenomegaly or masses, no  abdominal bruits or hernia , no rebound or guarding.   Extremities: No lower extremity edema. No clubbing or deformities. Neuro: Alert and oriented x 3.  Grossly intact. Skin: Warm and dry, no jaundice.   Psych: Alert and cooperative, normal mood and affect.   Imaging Studies: Ct Abdomen Pelvis W Contrast  Result Date: 01/03/2018 CLINICAL DATA:  Right lower abdominal pain EXAM: CT ABDOMEN AND PELVIS WITH CONTRAST TECHNIQUE: Multidetector CT imaging of the abdomen and pelvis was performed using the standard protocol following bolus administration of intravenous contrast. CONTRAST:  47mL ISOVUE-370 IOPAMIDOL (ISOVUE-370) INJECTION 76% COMPARISON:  CT 01/01/2018 FINDINGS: Lower chest: Lung bases demonstrate no acute airspace disease or pleural effusion. Linear atelectasis at the right base. Normal heart size. Small moderate paraesophageal hiatal hernia. Hepatobiliary: Slightly enlarged gallbladder without calcified stone. No biliary dilatation. No focal hepatic abnormality.  Pancreas: Unremarkable. No pancreatic ductal dilatation or surrounding inflammatory changes. Spleen: Normal in size without focal abnormality. Adrenals/Urinary Tract: Adrenal glands are unremarkable. Kidneys are normal, without renal calculi, focal lesion, or hydronephrosis. Bladder is unremarkable. Stomach/Bowel: Stomach is nonenlarged. Fluid filled but nondilated right lower quadrant small bowel. Minimal edema and fat stranding at the terminal ileum with prominent submucosal fat deposition. Appendix best seen on coronal views and is within normal limits. No colon wall thickening Vascular/Lymphatic: No significant vascular findings are present. No enlarged abdominal or pelvic lymph nodes. Reproductive: Status post hysterectomy. No adnexal masses. Other: No ventral wall hernia or abnormality. No abdominopelvic ascites. Small fatty umbilical hernia. Musculoskeletal: No acute or significant osseous findings. IMPRESSION: 1. Negative for acute  appendicitis. 2. Fluid-filled but nonenlarged terminal ileum. Subtle edema and fat stranding at the terminal ileum/ileocecal junction suggesting mild terminal ileitis as may be seen with inflammatory or infectious bowel disease. 3. Paraesophageal hiatal hernia Electronically Signed   By: Donavan Foil M.D.   On: 01/03/2018 19:43   Ct Abdomen Pelvis W Contrast  Result Date: 01/01/2018 CLINICAL DATA:  Diffuse abdominal pain for 2 months EXAM: CT ABDOMEN AND PELVIS WITH CONTRAST TECHNIQUE: Multidetector CT imaging of the abdomen and pelvis was performed using the standard protocol following bolus administration of intravenous contrast. CONTRAST:  28mL ISOVUE-370 IOPAMIDOL (ISOVUE-370) INJECTION 76% COMPARISON:  None. FINDINGS: Lower chest: No acute abnormality. Hepatobiliary: Diffuse fatty infiltration of the liver is noted. The gallbladder is within normal limits. No ductal dilatation is seen. Pancreas: Unremarkable. No pancreatic ductal dilatation or surrounding inflammatory changes. Spleen: Normal in size without focal abnormality. Adrenals/Urinary Tract: Adrenal glands are within normal limits. No renal or ureteral calculi are seen. Delayed images demonstrate normal excretion of contrast. The bladder is partially distended. Stomach/Bowel: Scattered diverticular change of the colon is noted without evidence of diverticulitis. The appendix is not well seen although no inflammatory changes to suggest appendicitis are noted. Some fatty change in the wall of the terminal ileum is noted which may be related to prior inflammatory bowel changes. A paraesophageal hiatal hernia is noted. Vascular/Lymphatic: No significant vascular findings are present. No enlarged abdominal or pelvic lymph nodes. Reproductive: Status post hysterectomy. No adnexal masses. Other: No abdominal wall hernia or abnormality. No abdominopelvic ascites. Musculoskeletal: No acute bony abnormality is noted. IMPRESSION: Fatty liver. Fatty  infiltration in the wall of the terminal ileum which may be related to prior inflammatory bowel change. Paraesophageal hiatal hernia. No acute abnormality noted. Electronically Signed   By: Inez Catalina M.D.   On: 01/01/2018 14:20    Assessment and Plan:   Hailey Galvan is a 62 y.o. y/o female here to see me today as an emergency room follow-up when she presented to the ER on 01/03/2018 abdominal pain.  It appears that the abdominal pain has been ongoing for a while and she has been evaluated by a surgeon in February 2019 with an upper endoscopy and colonoscopy.  Both of which did not show any gross abnormalities except acute esophagitis and tubular adenoma excised in the colon.  Unfortunately at that point of time the terminal ileum was not evaluated and no biopsies were taken and on this admission when she presented to the emergency room she had inflammation of the terminal ileum seen on the CAT scan indicating high ileitis.  Differential diagnosis for ileitis would be infectious or NSAID induced or from inflammatory bowel disease such as Crohn's disease.  The only way that one can diagnose that it  is Crohn's disease is to repeat a colonoscopy with ileal examination and biopsies.  Her long standing abdominal pain may be due to a combination of constipation and ileitis.     Plan 1.  No NSAIDs no smoking  2.  Colonoscopy with TI intubation and biopsies. 3. Linzess for constipation - 2 weeks samples provided 4. High fiber diet - patient information provided.    I have discussed alternative options, risks & benefits,  which include, but are not limited to, bleeding, infection, perforation,respiratory complication & drug reaction.  The patient agrees with this plan & written consent will be obtained.     Dr Hailey Bellows  MD,MRCP [facility]) Follow up in 6 weeks

## 2018-01-25 ENCOUNTER — Other Ambulatory Visit: Payer: Self-pay

## 2018-01-25 ENCOUNTER — Telehealth: Payer: Self-pay | Admitting: Gastroenterology

## 2018-01-25 MED ORDER — NA SULFATE-K SULFATE-MG SULF 17.5-3.13-1.6 GM/177ML PO SOLN: 1.0000 | ORAL | 0 refills | Status: DC

## 2018-01-25 NOTE — Telephone Encounter (Signed)
Pt left vm she states she needs rx for  procedure scheduled 6/7 and needs rx called in suprep and rx Dramodole

## 2018-01-25 NOTE — Telephone Encounter (Signed)
Ginger has sent Rx to [facility] @ [place], [place]  Per Ginger, contact PCP concerning tramadol Rx per Dr. Vicente Males.

## 2018-01-26 ENCOUNTER — Encounter: Admission: RE | Payer: Self-pay | Source: Ambulatory Visit

## 2018-01-26 ENCOUNTER — Ambulatory Visit: Admission: RE | Admit: 2018-01-26 | Payer: 59 | Source: Ambulatory Visit | Admitting: Gastroenterology

## 2018-01-26 ENCOUNTER — Telehealth: Payer: Self-pay | Admitting: Gastroenterology

## 2018-01-26 SURGERY — COLONOSCOPY WITH PROPOFOL
Anesthesia: General

## 2018-01-26 NOTE — Telephone Encounter (Signed)
Pt left vm she needs to reschedule her colonoscopy with Dr. Vicente Males for early in the morning for Ride

## 2018-01-29 LAB — HEPATIC FUNCTION PANEL
ALT: 21 (ref 7–35)
AST: 22 (ref 13–35)
Alkaline Phosphatase: 99 (ref 25–125)
Bilirubin, Total: 0.3

## 2018-01-29 LAB — BASIC METABOLIC PANEL
BUN: 12 (ref 4–21)
Creatinine: 0.8 (ref <=1.1)
Glucose: 91
Potassium: 4.6 (ref 3.4–5.3)
Sodium: 137 (ref 137–147)

## 2018-01-29 LAB — CBC AND DIFFERENTIAL
HCT: 43 (ref 36–46)
Hemoglobin: 14.8 (ref 12.0–16.0)
Platelets: 291 (ref 150–399)
WBC: 7.2

## 2018-01-29 NOTE — Telephone Encounter (Signed)
Returned patients call from Friday to reschedule her colonoscopy.  She asked for first thing in the am.  I told her the time is based on the OR schedule and she would have to call the day before to get the arrival time.  She said to ask them for a early morning appt I reiterated that the time is based on the OR schedule but I would note that her preference is early morning.  She states that her pain was so bad she felt like she had to go to the ER.  I asked if she would like to make a return office visit to discuss.  Then she goes on to say she doesn't know why she was prescribed Linzess.  I explained to her that Linzess is used to treat constipation-she said I'm not constipated I need pain medications.  I explained to her that our office policy does not allow [place] to prescribe pain medications.   She got upset with me and said she will find herself another gastroenterologist who cares about her.  I told her I'm sorry that she feels that way, and that we do care.    Thanks [employer]

## 2018-02-15 ENCOUNTER — Telehealth: Payer: Self-pay | Admitting: Gastroenterology

## 2018-02-15 NOTE — Telephone Encounter (Signed)
Pt left vm she states she needs a referral to have a colonoscopy with biopsy of her elium

## 2018-02-16 ENCOUNTER — Telehealth: Payer: Self-pay | Admitting: Gastroenterology

## 2018-02-16 NOTE — Telephone Encounter (Signed)
Pt left vm she states she is supposed to schedule a colonoscopy with Dr. Vicente Males to have a biopsy of her Ellium

## 2018-03-06 ENCOUNTER — Ambulatory Visit: Payer: 59 | Admitting: Gastroenterology

## 2018-03-30 LAB — HM COLONOSCOPY

## 2018-09-04 NOTE — Progress Notes (Signed)
Subjective:    Patient ID: Hailey Galvan, female    DOB: 08/03/1956, 63 y.o.   MRN: [id number]  HPI:  Ms. Arno is here to establish as a new pt. She is a pleasant 63 year old female. PMH: HLD, Depression/Anxiety, Hernia, Ileitis, Chronic pain (cellulitis of LLE that caused permanent nerve damage) She stable mood on Bupropion 31m QD, Fluoxetine 454mQD- not currently working with therapist HLD- taking atorvastatin 1014mtolerating well Chronic pain managed by Gabapentin, last contact with Pain Mgt >2.5 years ago Requests referral to GI to address hernia/ileus Estimates to walk 1-2 miles/day M-F when at work Smokes 1-2 cigarettes/day, declines smoking cessation Estimates to drink >60 oz water/day, follows heart healthy diet  Patient Care Team    Relationship Specialty Notifications Start End  Dan[place] SpringsatValetta Fuller NP PCP - General Family Medicine  09/06/18   KitDeno EtienneD Consulting Physician Anesthesiology  09/06/18   ReePeri MarisD Referring Physician Student  09/06/18     Patient Active Problem List   Diagnosis Date Noted  . Healthcare maintenance 09/06/2018  . Ileitis 09/06/2018  . Recurrent epistaxis 01/18/2018  . Carpal tunnel syndrome on right 08/06/2015  . Pain in both hands 08/03/2015  . Pseudogout of foot, left 08/04/2014  . Right wrist pain 11/19/2013  . Urge incontinence 05/30/2013  . Chronic, continuous use of opioids 12/05/2012  . Neuralgia and neuritis, unspecified 12/05/2012  . S/P arthroscopic knee surgery 10/01/2012  . Tendonitis of elbow, right 06/20/2012  . Pain in left knee 12/08/2011  . Hyperlipidemia 11/15/2011     Past Medical History:  Diagnosis Date  . Depression .  . HMarland Kitchenatal hernia   . Ileitis      Past Surgical History:  Procedure Laterality Date  . ABDOMINAL HYSTERECTOMY    . BREAST SURGERY     Tumor Removal  . CESAREAN SECTION    . KNEE SURGERY    . TONSILLECTOMY       Family History  Problem Relation Age of Onset  .  Alcohol abuse Mother   . Stroke Mother   . Diabetes Father   . Depression Paternal Grandmother      Social History   Substance and Sexual Activity  Drug Use Never     Social History   Substance and Sexual Activity  Alcohol Use Yes  . Alcohol/week: 9.0 standard drinks  . Types: 9 Glasses of wine per week     Social History   Tobacco Use  Smoking Status Current Some Day Smoker  . Years: 5.00  . Types: Cigarettes  Smokeless Tobacco Never Used  Tobacco Comment   1-2 cigarettes per day     Outpatient Encounter Medications as of 09/06/2018  Medication Sig  . atorvastatin (LIPITOR) 10 MG tablet Take by mouth.  . budesonide (ENTOCORT EC) 3 MG 24 hr capsule Take 3 mg by mouth daily.  . bMarland KitchenPROPion (WELLBUTRIN XL) 300 MG 24 hr tablet TAKE 1 TABLET BY MOUTH EVERY DAY  . estradiol (VIVELLE-DOT) 0.1 MG/24HR patch Place onto the skin.  . FMarland KitchenUoxetine (PROZAC) 40 MG capsule TAKE 1 CAPSULE BY MOUTH EVERY DAY  . gabapentin (NEURONTIN) 600 MG tablet Take 600 mg by mouth daily.  . oMarland Kitcheneprazole (PRILOSEC) 40 MG capsule Take 40 mg by mouth daily.  . oMarland Kitchenybutynin (DITROPAN-XL) 5 MG 24 hr tablet   . [DISCONTINUED] adapalene (DIFFERIN) 0.1 % cream Apply topically.  . [DISCONTINUED] butalbital-acetaminophen-caffeine (FIORICET, ESGIC) 50-325-40 MG tablet TAKE 1 TABLET BY MOUTH EVERY 6 HOURS AS  NEEDED FOR PAIN  . [DISCONTINUED] clindamycin (CLEOCIN T) 1 % SWAB Apply topically.  . [DISCONTINUED] clobetasol ointment (TEMOVATE) 0.05 % Apply topically.  . [DISCONTINUED] dicyclomine (BENTYL) 20 MG tablet   . [DISCONTINUED] fluticasone (FLONASE) 50 MCG/ACT nasal spray USE 2 SPRAYS IN EACH NOSTRIL EVERY DAY  . [DISCONTINUED] gabapentin (NEURONTIN) 600 MG tablet   . [DISCONTINUED] HYDROcodone-acetaminophen (NORCO/VICODIN) 5-325 MG tablet Take 1 tablet by mouth every 4 (four) hours as needed for severe pain. (Patient not taking: Reported on 01/24/2018)  . [DISCONTINUED] ibuprofen (ADVIL,MOTRIN) 800 MG tablet  Take by mouth.  . [DISCONTINUED] metoCLOPramide (REGLAN) 10 MG tablet Take 1 tablet (10 mg total) by mouth every 6 (six) hours as needed for nausea (at start of headache).  . [DISCONTINUED] Minocycline HCl (ARESTIN) 1 MG MISC   . [DISCONTINUED] Na Sulfate-K Sulfate-Mg Sulf (SUPREP BOWEL PREP KIT) 17.5-3.13-1.6 GM/177ML SOLN Take 1 kit by mouth as directed.  . [DISCONTINUED] ondansetron (ZOFRAN) 4 MG tablet Take 1 tablet (4 mg total) by mouth every 8 (eight) hours as needed for nausea or vomiting.  . [DISCONTINUED] oxymorphone (OPANA) 10 MG tablet Take by mouth.  . [DISCONTINUED] ranitidine (ZANTAC) 150 MG tablet TAKE 1 TABLET BY MOUTH TWICE DAILY  . [DISCONTINUED] terbinafine (LAMISIL) 1 % cream Apply topically.  . [DISCONTINUED] tolterodine (DETROL LA) 4 MG 24 hr capsule Take by mouth.  . [DISCONTINUED] traZODone (DESYREL) 100 MG tablet TAKE 1 TABLET BY MOUTH ONCE DAILY NIGHTLY  . [DISCONTINUED] tretinoin (RETIN-A) 0.025 % cream Apply topically.  . [DISCONTINUED] triamcinolone cream (KENALOG) 0.1 % Apply topically.  . [DISCONTINUED] urea (CARMOL) 40 % CREA Apply to feet daily for dry, thick, dark skin.  . [DISCONTINUED] vitamin B-12 (CYANOCOBALAMIN) 1000 MCG tablet Take by mouth.  . [DISCONTINUED] Vitamin D, Ergocalciferol, (DRISDOL) 50000 units CAPS capsule Take by mouth.   No facility-administered encounter medications on file as of 09/06/2018.     Allergies: Etodolac; Naproxen; Tizanidine; and Zanaflex [tizanidine hcl]  Body mass index is 24.99 kg/m.  Blood pressure 120/78, pulse 67, temperature 98.8 F (37.1 C), temperature source Oral, height '5\' 4"'  (1.626 m), weight 145 lb 9.6 oz (66 kg), SpO2 100 %.  Review of Systems  Constitutional: Positive for fatigue. Negative for activity change, appetite change, chills, diaphoresis, fever and unexpected weight change.  HENT: Positive for congestion.   Respiratory: Positive for cough. Negative for chest tightness, shortness of breath,  wheezing and stridor.   Cardiovascular: Negative for chest pain, palpitations and leg swelling.  Gastrointestinal: Positive for abdominal pain. Negative for abdominal distention, blood in stool, constipation, diarrhea, nausea and vomiting.       "moving hernia"  Genitourinary: Negative for difficulty urinating and flank pain.  Musculoskeletal: Positive for arthralgias and myalgias. Negative for back pain, gait problem and joint swelling.  Skin: Negative for pallor, rash and wound.  Neurological: Negative for dizziness and headaches.  Hematological: Does not bruise/bleed easily.  Psychiatric/Behavioral: Positive for dysphoric mood. Negative for agitation, behavioral problems, confusion, decreased concentration, hallucinations, self-injury, sleep disturbance and suicidal ideas. The patient is nervous/anxious. The patient is not hyperactive.        Objective:   Physical Exam Vitals signs and nursing note reviewed.  Constitutional:      General: She is not in acute distress.    Appearance: She is normal weight. She is not ill-appearing, toxic-appearing or diaphoretic.  HENT:     Head: Normocephalic and atraumatic.  Cardiovascular:     Rate and Rhythm: Normal rate.  Pulses: Normal pulses.     Heart sounds: Normal heart sounds. No murmur. No friction rub. No gallop.   Pulmonary:     Effort: Pulmonary effort is normal. No respiratory distress.     Breath sounds: Normal breath sounds. No stridor. No wheezing, rhonchi or rales.  Chest:     Chest wall: No tenderness.  Skin:    General: Skin is warm and dry.     Capillary Refill: Capillary refill takes less than 2 seconds.  Neurological:     Mental Status: She is alert and oriented to person, place, and time.  Psychiatric:        Mood and Affect: Mood normal.        Behavior: Behavior normal.        Thought Content: Thought content normal.        Judgment: Judgment normal.       Assessment & Plan:   1. Ileitis   2.  Hyperlipidemia, unspecified hyperlipidemia type   3. Healthcare maintenance     Healthcare maintenance Continue all medications as directed. Continue to drink plenty of water and follow Mediterranean Diet. Referral to Gastroenterologist placed. Recommend complete physical in 6 weeks, fasting lab appt the week prior.  Hyperlipidemia Will check fasting labs at CPE Currently on Atorvastatin 34m QD, tolerating well  Ileitis GI referral placed    FOLLOW-UP:  Return in about 6 weeks (around 10/18/2018) for Fasting Labs, CPE.

## 2018-09-06 ENCOUNTER — Ambulatory Visit (INDEPENDENT_AMBULATORY_CARE_PROVIDER_SITE_OTHER): Payer: 59 | Admitting: Adult Health

## 2018-09-06 ENCOUNTER — Encounter: Payer: Self-pay | Admitting: Adult Health

## 2018-09-06 VITALS — BP 120/78 | HR 67 | Temp 98.8°F | Ht 64.0 in | Wt 145.6 lb

## 2018-09-06 DIAGNOSIS — K529 Noninfective gastroenteritis and colitis, unspecified: Secondary | ICD-10-CM

## 2018-09-06 DIAGNOSIS — Z0000 Encounter for general adult medical examination without abnormal findings: Secondary | ICD-10-CM

## 2018-09-06 DIAGNOSIS — E785 Hyperlipidemia, unspecified: Secondary | ICD-10-CM

## 2018-09-06 NOTE — Assessment & Plan Note (Signed)
Will check fasting labs at CPE Currently on Atorvastatin 20m QD, tolerating well

## 2018-09-06 NOTE — Patient Instructions (Signed)
Mediterranean Diet A Mediterranean diet refers to food and lifestyle choices that are based on the traditions of countries located on the [employer]. This way of eating has been shown to help prevent certain conditions and improve outcomes for people who have chronic diseases, like kidney disease and heart disease. What are tips for following this plan? Lifestyle  Cook and eat meals together with your family, when possible.  Drink enough fluid to keep your urine clear or pale yellow.  Be physically active every day. This includes: ? Aerobic exercise like running or swimming. ? Leisure activities like gardening, walking, or housework.  Get 7-8 hours of sleep each night.  If recommended by your health care provider, drink red wine in moderation. This means 1 glass a day for nonpregnant women and 2 glasses a day for men. A glass of wine equals 5 oz (150 mL). Reading food labels   Check the serving size of packaged foods. For foods such as rice and pasta, the serving size refers to the amount of cooked product, not dry.  Check the total fat in packaged foods. Avoid foods that have saturated fat or trans fats.  Check the ingredients list for added sugars, such as corn syrup. Shopping  At the grocery store, buy most of your food from the areas near the walls of the store. This includes: ? Fresh fruits and vegetables (produce). ? Grains, beans, nuts, and seeds. Some of these may be available in unpackaged forms or large amounts (in bulk). ? Fresh seafood. ? Poultry and eggs. ? Low-fat dairy products.  Buy whole ingredients instead of prepackaged foods.  Buy fresh fruits and vegetables in-season from local farmers markets.  Buy frozen fruits and vegetables in resealable bags.  If you do not have access to quality fresh seafood, buy precooked frozen shrimp or canned fish, such as tuna, salmon, or sardines.  Buy small amounts of raw or cooked vegetables, salads, or olives from  the deli or salad bar at your store.  Stock your pantry so you always have certain foods on hand, such as olive oil, canned tuna, canned tomatoes, rice, pasta, and beans. Cooking  Cook foods with extra-virgin olive oil instead of using butter or other vegetable oils.  Have meat as a side dish, and have vegetables or grains as your main dish. This means having meat in small portions or adding small amounts of meat to foods like pasta or stew.  Use beans or vegetables instead of meat in common dishes like chili or lasagna.  Experiment with different cooking methods. Try roasting or broiling vegetables instead of steaming or sauteing them.  Add frozen vegetables to soups, stews, pasta, or rice.  Add nuts or seeds for added healthy fat at each meal. You can add these to yogurt, salads, or vegetable dishes.  Marinate fish or vegetables using olive oil, lemon juice, garlic, and fresh herbs. Meal planning   Plan to eat 1 vegetarian meal one day each week. Try to work up to 2 vegetarian meals, if possible.  Eat seafood 2 or more times a week.  Have healthy snacks readily available, such as: ? Vegetable sticks with hummus. ? [place] yogurt. ? Fruit and nut trail mix.  Eat balanced meals throughout the week. This includes: ? Fruit: 2-3 servings a day ? Vegetables: 4-5 servings a day ? Low-fat dairy: 2 servings a day ? Fish, poultry, or lean meat: 1 serving a day ? Beans and legumes: 2 or more servings a week ?  Nuts and seeds: 1-2 servings a day ? Whole grains: 6-8 servings a day ? Extra-virgin olive oil: 3-4 servings a day  Limit red meat and sweets to only a few servings a month What are my food choices?  Mediterranean diet ? Recommended ? Grains: Whole-grain pasta. Brown rice. Bulgar wheat. Polenta. Couscous. Whole-wheat bread. [place]. ? Vegetables: Artichokes. Beets. Broccoli. Cabbage. Carrots. Eggplant. Green beans. Chard. Kale. Spinach. Onions. Leeks. Peas. Squash.  Tomatoes. Peppers. Radishes. ? Fruits: Apples. Apricots. Avocado. Berries. Bananas. Cherries. Dates. Figs. Grapes. Lemons. Melon. Oranges. Peaches. Plums. Pomegranate. ? Meats and other protein foods: Beans. Almonds. Sunflower seeds. Pine nuts. Peanuts. [place]. Salmon. Scallops. Shrimp. [place]. Tilapia. Clams. Oysters. Eggs. ? Dairy: Low-fat milk. Cheese. Greek yogurt. ? Beverages: Water. Red wine. Herbal tea. ? Fats and oils: Extra virgin olive oil. Avocado oil. Grape seed oil. ? Sweets and desserts: [place] yogurt with honey. Baked apples. Poached pears. Trail mix. ? Seasoning and other foods: Basil. Cilantro. Coriander. Cumin. Mint. Parsley. Sage. Rosemary. Tarragon. Garlic. Oregano. Thyme. Pepper. Balsalmic vinegar. Tahini. Hummus. Tomato sauce. Olives. Mushrooms. ? Limit these ? Grains: Prepackaged pasta or rice dishes. Prepackaged cereal with added sugar. ? Vegetables: Deep fried potatoes (french fries). ? Fruits: Fruit canned in syrup. ? Meats and other protein foods: Beef. Pork. Lamb. Poultry with skin. Hot dogs. [place] Salines. ? Dairy: Ice cream. Sour cream. Whole milk. ? Beverages: Juice. Sugar-sweetened soft drinks. Beer. Liquor and spirits. ? Fats and oils: Butter. Canola oil. Vegetable oil. Beef fat (tallow). Lard. ? Sweets and desserts: Cookies. Cakes. Pies. Candy. ? Seasoning and other foods: Mayonnaise. Premade sauces and marinades. ? The items listed may not be a complete list. Talk with your dietitian about what dietary choices are right for you. Summary  The Mediterranean diet includes both food and lifestyle choices.  Eat a variety of fresh fruits and vegetables, beans, nuts, seeds, and whole grains.  Limit the amount of red meat and sweets that you eat.  Talk with your health care provider about whether it is safe for you to drink red wine in moderation. This means 1 glass a day for nonpregnant women and 2 glasses a day for men. A glass of wine equals 5 oz (150 mL). This information  is not intended to replace advice given to you by your health care provider. Make sure you discuss any questions you have with your health care provider. Document Released: 03/31/2016 Document Revised: 05/03/2016 Document Reviewed: 03/31/2016 Elsevier Interactive Patient Education  2019 [place] all medications as directed. Continue to drink plenty of water and follow Mediterranean Diet. Referral to Gastroenterologist placed. Recommend complete physical in 6 weeks, fasting lab appt the week prior. WELCOME TO THE PRACTICE!

## 2018-09-06 NOTE — Assessment & Plan Note (Signed)
GI referral placed

## 2018-09-06 NOTE — Assessment & Plan Note (Signed)
Continue all medications as directed. Continue to drink plenty of water and follow Mediterranean Diet. Referral to Gastroenterologist placed. Recommend complete physical in 6 weeks, fasting lab appt the week prior.

## 2018-10-11 ENCOUNTER — Other Ambulatory Visit (INDEPENDENT_AMBULATORY_CARE_PROVIDER_SITE_OTHER): Payer: 59

## 2018-10-11 DIAGNOSIS — Z0000 Encounter for general adult medical examination without abnormal findings: Secondary | ICD-10-CM

## 2018-10-11 DIAGNOSIS — E785 Hyperlipidemia, unspecified: Secondary | ICD-10-CM

## 2018-10-12 LAB — CBC WITH DIFFERENTIAL/PLATELET
BASOS ABS: 0 10*3/uL (ref 0.0–0.2)
Basos: 0 %
EOS (ABSOLUTE): 0.1 10*3/uL (ref 0.0–0.4)
Eos: 1 %
HEMATOCRIT: 37.9 % (ref 34.0–46.6)
HEMOGLOBIN: 13.1 g/dL (ref 11.1–15.9)
Immature Grans (Abs): 0.1 10*3/uL (ref 0.0–0.1)
Immature Granulocytes: 1 %
Lymphocytes Absolute: 2 10*3/uL (ref 0.7–3.1)
Lymphs: 29 %
MCH: 32.1 pg (ref 26.6–33.0)
MCHC: 34.6 g/dL (ref 31.5–35.7)
MCV: 93 fL (ref 79–97)
Monocytes Absolute: 0.6 10*3/uL (ref 0.1–0.9)
Monocytes: 9 %
Neutrophils Absolute: 4.2 10*3/uL (ref 1.4–7.0)
Neutrophils: 60 %
Platelets: 331 10*3/uL (ref 150–450)
RBC: 4.08 x10E6/uL (ref 3.77–5.28)
RDW: 13.3 % (ref 11.7–15.4)
WBC: 7.1 10*3/uL (ref 3.4–10.8)

## 2018-10-12 LAB — COMPREHENSIVE METABOLIC PANEL
ALBUMIN: 4.7 g/dL (ref 3.8–4.8)
ALT: 23 IU/L (ref 0–32)
AST: 23 IU/L (ref 0–40)
Albumin/Globulin Ratio: 1.7 (ref 1.2–2.2)
Alkaline Phosphatase: 107 IU/L (ref 39–117)
BUN/Creatinine Ratio: 13 (ref 12–28)
BUN: 12 mg/dL (ref 8–27)
Bilirubin Total: 0.3 mg/dL (ref 0.0–1.2)
CO2: 23 mmol/L (ref 20–29)
CREATININE: 0.94 mg/dL (ref 0.57–1.00)
Calcium: 9.7 mg/dL (ref 8.7–10.3)
Chloride: 100 mmol/L (ref 96–106)
GFR calc Af Amer: 75 mL/min/{1.73_m2} (ref >59)
GFR calc non Af Amer: 65 mL/min/{1.73_m2} (ref >59)
GLOBULIN, TOTAL: 2.7 g/dL (ref 1.5–4.5)
GLUCOSE: 88 mg/dL (ref 65–99)
Potassium: 4.5 mmol/L (ref 3.5–5.2)
Sodium: 140 mmol/L (ref 134–144)
Total Protein: 7.4 g/dL (ref 6.0–8.5)

## 2018-10-12 LAB — HEMOGLOBIN A1C
Est. average glucose Bld gHb Est-mCnc: 108 mg/dL
Hgb A1c MFr Bld: 5.4 % (ref 4.8–5.6)

## 2018-10-12 LAB — LIPID PANEL
Chol/HDL Ratio: 3.4 ratio (ref 0.0–4.4)
Cholesterol, Total: 278 mg/dL — ABNORMAL HIGH (ref 100–199)
HDL: 82 mg/dL (ref >39)
LDL Calculated: 173 mg/dL — ABNORMAL HIGH (ref 0–99)
TRIGLYCERIDES: 117 mg/dL (ref 0–149)
VLDL Cholesterol Cal: 23 mg/dL (ref 5–40)

## 2018-10-12 LAB — TSH: TSH: 2.02 u[IU]/mL (ref 0.450–4.500)

## 2018-10-12 NOTE — Progress Notes (Signed)
Subjective:    Patient ID: Hailey Galvan, female    DOB: [birth year], 63 y.o.   MRN: [id number]  HPI: 09/06/2018 OV:  Hailey Galvan is here to establish as a new pt. She is a pleasant 63 year old female. PMH: HLD, Depression/Anxiety, Hernia, Ileitis, Chronic pain (cellulitis of LLE that caused permanent nerve damage) She stable mood on Bupropion 300mg  QD, Fluoxetine 40mg  QD- not currently working with therapist HLD- taking atorvastatin 10mg - tolerating well Chronic pain managed by Gabapentin, last contact with Pain Mgt >2.5 years ago Requests referral to GI to address hernia/ileus Estimates to walk 1-2 miles/day M-F when at work Smokes 1-2 cigarettes/day, declines smoking cessation Estimates to drink >60 oz water/day, follows heart healthy diet  10/18/2018 OV: Hailey Galvan is here for CPE She continues to walk >2.5 miles/day M-F when working She states "I drink water all day long" and tries to follow a heart healthy diet. She reports "a painful lump on my right breast" that she detected a few weeks ago.  Reviewed Recent Labs: TSH-WNL, 2.020 A1c-WNL, 5.4 CMP-WML CBC-WNL The 10-year ASCVD risk score Mikey Bussing DC Jr., et al., 2013) is: 7.7%   Values used to calculate the score:     Age: 54 years     Sex: Female     Is Non-Hispanic African American: No     Diabetic: No     Tobacco smoker: Yes     Systolic Blood Pressure: 671 mmHg     Is BP treated: No     HDL Cholesterol: 82 mg/dL     Total Cholesterol: 278 mg/dL LDL-173 She stopped Atorvastatin a few months ago, instructed to re-start-she is agreeable   Patient Care Team    Relationship Specialty Notifications Start End  Mina Marble D, NP PCP - General Family Medicine  09/06/18   Deno Etienne, MD Consulting Physician Anesthesiology  09/06/18   Peri Maris, MD Referring Physician Student  09/06/18     Patient Active Problem List   Diagnosis Date Noted  . Breast mass, right 10/18/2018  . Healthcare maintenance 09/06/2018    . Ileitis 09/06/2018  . Recurrent epistaxis 01/18/2018  . Carpal tunnel syndrome on right 08/06/2015  . Pain in both hands 08/03/2015  . Pseudogout of foot, left 08/04/2014  . Right wrist pain 11/19/2013  . Urge incontinence 05/30/2013  . Chronic, continuous use of opioids 12/05/2012  . Neuralgia and neuritis, unspecified 12/05/2012  . S/P arthroscopic knee surgery 10/01/2012  . Tendonitis of elbow, right 06/20/2012  . Pain in left knee 12/08/2011  . Hyperlipidemia 11/15/2011     Past Medical History:  Diagnosis Date  . Depression .  Marland Kitchen Hiatal hernia   . Ileitis      Past Surgical History:  Procedure Laterality Date  . ABDOMINAL HYSTERECTOMY    . BREAST SURGERY     Tumor Removal  . CESAREAN SECTION    . KNEE SURGERY    . TONSILLECTOMY       Family History  Problem Relation Age of Onset  . Alcohol abuse Mother   . Stroke Mother   . Diabetes Father   . Depression Paternal Grandmother      Social History   Substance and Sexual Activity  Drug Use Never     Social History   Substance and Sexual Activity  Alcohol Use Yes  . Alcohol/week: 9.0 standard drinks  . Types: 9 Glasses of wine per week     Social History   Tobacco Use  Smoking  Status Current Some Day Smoker  . Years: 5.00  . Types: Cigarettes  Smokeless Tobacco Never Used  Tobacco Comment   1-2 cigarettes per day     Outpatient Encounter Medications as of 10/18/2018  Medication Sig  . atorvastatin (LIPITOR) 10 MG tablet Take by mouth.  . budesonide (ENTOCORT EC) 3 MG 24 hr capsule Take 3 mg by mouth daily.  Marland Kitchen buPROPion (WELLBUTRIN XL) 300 MG 24 hr tablet TAKE 1 TABLET BY MOUTH EVERY DAY  . butalbital-acetaminophen-caffeine (FIORICET, ESGIC) 50-325-40 MG tablet Take 1 tablet by mouth every 6 (six) hours as needed.  . cyanocobalamin 2000 MCG tablet Take 2,000 mcg by mouth daily.  Marland Kitchen estradiol (VIVELLE-DOT) 0.1 MG/24HR patch Place onto the skin.  Marland Kitchen FLUoxetine (PROZAC) 40 MG capsule TAKE 1  CAPSULE BY MOUTH EVERY DAY  . gabapentin (NEURONTIN) 600 MG tablet Take 600 mg by mouth daily.  Marland Kitchen omeprazole (PRILOSEC) 40 MG capsule Take 40 mg by mouth daily.  Marland Kitchen oxybutynin (DITROPAN-XL) 5 MG 24 hr tablet    No facility-administered encounter medications on file as of 10/18/2018.     Allergies: Etodolac; Naproxen; Tizanidine; and Zanaflex [tizanidine hcl]  Body mass index is 25.44 kg/m.  Blood pressure 117/72, pulse 66, temperature 98.8 F (37.1 C), temperature source Oral, height 5\' 4"  (1.626 m), weight 148 lb 3.2 oz (67.2 kg), SpO2 98 %.  Review of Systems  Constitutional: Positive for fatigue. Negative for activity change, appetite change, chills, diaphoresis, fever and unexpected weight change.  HENT: Positive for congestion.   Respiratory: Positive for cough. Negative for chest tightness, shortness of breath, wheezing and stridor.   Cardiovascular: Negative for chest pain, palpitations and leg swelling.  Gastrointestinal: Positive for abdominal pain. Negative for abdominal distention, blood in stool, constipation, diarrhea, nausea and vomiting.       "moving hernia"  Genitourinary: Negative for difficulty urinating and flank pain.  Musculoskeletal: Positive for arthralgias and myalgias. Negative for back pain, gait problem and joint swelling.  Skin: Negative for pallor, rash and wound.  Neurological: Negative for dizziness and headaches.  Hematological: Does not bruise/bleed easily.  Psychiatric/Behavioral: Positive for dysphoric mood. Negative for agitation, behavioral problems, confusion, decreased concentration, hallucinations, self-injury, sleep disturbance and suicidal ideas. The patient is nervous/anxious. The patient is not hyperactive.        Objective:   Physical Exam Vitals signs and nursing note reviewed.  Constitutional:      General: She is not in acute distress.    Appearance: She is normal weight. She is not ill-appearing, toxic-appearing or diaphoretic.    HENT:     Head: Normocephalic and atraumatic.     Right Ear: Tympanic membrane, ear canal and external ear normal. There is no impacted cerumen.     Left Ear: Tympanic membrane, ear canal and external ear normal. There is no impacted cerumen.     Nose: Nose normal. No congestion or rhinorrhea.     Mouth/Throat:     Mouth: Mucous membranes are dry.     Pharynx: Oropharynx is clear. No oropharyngeal exudate or posterior oropharyngeal erythema.  Eyes:     Extraocular Movements: Extraocular movements intact.     Conjunctiva/sclera: Conjunctivae normal.     Pupils: Pupils are equal, round, and reactive to light.  Cardiovascular:     Rate and Rhythm: Normal rate.     Pulses: Normal pulses.     Heart sounds: Normal heart sounds. No murmur. No friction rub. No gallop.   Pulmonary:  Effort: Pulmonary effort is normal. No respiratory distress.     Breath sounds: Normal breath sounds. No stridor. No wheezing, rhonchi or rales.  Chest:     Chest wall: No tenderness.     Breasts:        Right: Mass present. No swelling, bleeding, inverted nipple, nipple discharge, skin change or tenderness.        Left: No swelling, bleeding, inverted nipple, mass, nipple discharge, skin change or tenderness.       Comments: R Breast- palpable, tender mass noted at 5:30 position  Skin:    General: Skin is warm and dry.     Capillary Refill: Capillary refill takes less than 2 seconds.  Neurological:     Mental Status: She is alert and oriented to person, place, and time.  Psychiatric:        Mood and Affect: Mood normal.        Behavior: Behavior normal.        Thought Content: Thought content normal.        Judgment: Judgment normal.       Assessment & Plan:   1. Need for zoster vaccination   2. Screening for breast cancer   3. Breast mass, right   4. Healthcare maintenance     Healthcare maintenance Please re-start Atorvastatin and take once nightly. Continue to drink plenty of water and  follow Mediterranean diet. Diagnostic mammogram ordered to address painful lump on right breast. Follow-up in 6 months.  Breast mass, right Diagnostic Mammogram ordered     FOLLOW-UP:  Return in about 6 months (around 04/18/2019) for Regular Follow Up.

## 2018-10-17 ENCOUNTER — Telehealth: Payer: Self-pay | Admitting: Gastroenterology

## 2018-10-17 NOTE — Telephone Encounter (Signed)
Records received to be reviewed  Pt was referred on 01/16 for office visit she wants to switch to an office in [place] states that she wanted to establish GI doctor since she lives here now. Please review and advise

## 2018-10-18 ENCOUNTER — Ambulatory Visit (INDEPENDENT_AMBULATORY_CARE_PROVIDER_SITE_OTHER): Payer: 59 | Admitting: Adult Health

## 2018-10-18 ENCOUNTER — Encounter: Payer: Self-pay | Admitting: Adult Health

## 2018-10-18 VITALS — BP 117/72 | HR 66 | Temp 98.8°F | Ht 64.0 in | Wt 148.2 lb

## 2018-10-18 DIAGNOSIS — Z23 Encounter for immunization: Secondary | ICD-10-CM | POA: Diagnosis not present

## 2018-10-18 DIAGNOSIS — Z0000 Encounter for general adult medical examination without abnormal findings: Secondary | ICD-10-CM

## 2018-10-18 DIAGNOSIS — Z1239 Encounter for other screening for malignant neoplasm of breast: Secondary | ICD-10-CM

## 2018-10-18 DIAGNOSIS — N6310 Unspecified lump in the right breast, unspecified quadrant: Secondary | ICD-10-CM | POA: Insufficient documentation

## 2018-10-18 NOTE — Assessment & Plan Note (Signed)
Please re-start Atorvastatin and take once nightly. Continue to drink plenty of water and follow Mediterranean diet. Diagnostic mammogram ordered to address painful lump on right breast. Follow-up in 6 months.

## 2018-10-18 NOTE — Assessment & Plan Note (Signed)
Diagnostic Mammogram ordered

## 2018-10-18 NOTE — Patient Instructions (Signed)
Preventive Care for Adults, Female  A healthy lifestyle and preventive care can promote health and wellness. Preventive health guidelines for women include the following key practices.   A routine yearly physical is a good way to check with your health care provider about your health and preventive screening. It is a chance to share any concerns and updates on your health and to receive a thorough exam.   Visit your dentist for a routine exam and preventive care every 6 months. Brush your teeth twice a day and floss once a day. Good oral hygiene prevents tooth decay and gum disease.   The frequency of eye exams is based on your age, health, family medical history, use of contact lenses, and other factors. Follow your health care provider's recommendations for frequency of eye exams.   Eat a healthy diet. Foods like vegetables, fruits, whole grains, low-fat dairy products, and lean protein foods contain the nutrients you need without too many calories. Decrease your intake of foods high in solid fats, added sugars, and salt. Eat the right amount of calories for you.Get information about a proper diet from your health care provider, if necessary.   Regular physical exercise is one of the most important things you can do for your health. Most adults should get at least 150 minutes of moderate-intensity exercise (any activity that increases your heart rate and causes you to sweat) each week. In addition, most adults need muscle-strengthening exercises on 2 or more days a week.   Maintain a healthy weight. The body mass index (BMI) is a screening tool to identify possible weight problems. It provides an estimate of body fat based on height and weight. Your health care provider can find your BMI, and can help you achieve or maintain a healthy weight.For adults 20 years and older:   - A BMI below 18.5 is considered underweight.   - A BMI of 18.5 to 24.9 is normal.   - A BMI of 25 to 29.9 is  considered overweight.   - A BMI of 30 and above is considered obese.   Maintain normal blood lipids and cholesterol levels by exercising and minimizing your intake of trans and saturated fats.  Eat a balanced diet with plenty of fruit and vegetables. Blood tests for lipids and cholesterol should begin at age 20 and be repeated every 5 years minimum.  If your lipid or cholesterol levels are high, you are over 40, or you are at high risk for heart disease, you may need your cholesterol levels checked more frequently.Ongoing high lipid and cholesterol levels should be treated with medicines if diet and exercise are not working.   If you smoke, find out from your health care provider how to quit. If you do not use tobacco, do not start.   Lung cancer screening is recommended for adults aged 55-80 years who are at high risk for developing lung cancer because of a history of smoking. A yearly low-dose CT scan of the lungs is recommended for people who have at least a 30-pack-year history of smoking and are a current smoker or have quit within the past 15 years. A pack year of smoking is smoking an average of 1 pack of cigarettes a day for 1 year (for example: 1 pack a day for 30 years or 2 packs a day for 15 years). Yearly screening should continue until the smoker has stopped smoking for at least 15 years. Yearly screening should be stopped for people who develop a   health problem that would prevent them from having lung cancer treatment.   If you are pregnant, do not drink alcohol. If you are breastfeeding, be very cautious about drinking alcohol. If you are not pregnant and choose to drink alcohol, do not have more than 1 drink per day. One drink is considered to be 12 ounces (355 mL) of beer, 5 ounces (148 mL) of wine, or 1.5 ounces (44 mL) of liquor.   Avoid use of street drugs. Do not share needles with anyone. Ask for help if you need support or instructions about stopping the use of  drugs.   High blood pressure causes heart disease and increases the risk of stroke. Your blood pressure should be checked at least yearly.  Ongoing high blood pressure should be treated with medicines if weight loss and exercise do not work.   If you are 69-55 years old, ask your health care provider if you should take aspirin to prevent strokes.   Diabetes screening involves taking a blood sample to check your fasting blood sugar level. This should be done once every 3 years, after age 38, if you are within normal weight and without risk factors for diabetes. Testing should be considered at a younger age or be carried out more frequently if you are overweight and have at least 1 risk factor for diabetes.   Breast cancer screening is essential preventive care for women. You should practice "breast self-awareness."  This means understanding the normal appearance and feel of your breasts and may include breast self-examination.  Any changes detected, no matter how small, should be reported to a health care provider.  Women in their 80s and 30s should have a clinical breast exam (CBE) by a health care provider as part of a regular health exam every 1 to 3 years.  After age 66, women should have a CBE every year.  Starting at age 1, women should consider having a mammogram (breast X-ray test) every year.  Women who have a family history of breast cancer should talk to their health care provider about genetic screening.  Women at a high risk of breast cancer should talk to their health care providers about having an MRI and a mammogram every year.   -Breast cancer gene (BRCA)-related cancer risk assessment is recommended for women who have family members with BRCA-related cancers. BRCA-related cancers include breast, ovarian, tubal, and peritoneal cancers. Having family members with these cancers may be associated with an increased risk for harmful changes (mutations) in the breast cancer genes BRCA1 and  BRCA2. Results of the assessment will determine the need for genetic counseling and BRCA1 and BRCA2 testing.   The Pap test is a screening test for cervical cancer. A Pap test can show cell changes on the cervix that might become cervical cancer if left untreated. A Pap test is a procedure in which cells are obtained and examined from the lower end of the uterus (cervix).   - Women should have a Pap test starting at age 57.   - Between ages 90 and 70, Pap tests should be repeated every 2 years.   - Beginning at age 63, you should have a Pap test every 3 years as long as the past 3 Pap tests have been normal.   - Some women have medical problems that increase the chance of getting cervical cancer. Talk to your health care provider about these problems. It is especially important to talk to your health care provider if a  new problem develops soon after your last Pap test. In these cases, your health care provider may recommend more frequent screening and Pap tests.   - The above recommendations are the same for women who have or have not gotten the vaccine for human papillomavirus (HPV).   - If you had a hysterectomy for a problem that was not cancer or a condition that could lead to cancer, then you no longer need Pap tests. Even if you no longer need a Pap test, a regular exam is a good idea to make sure no other problems are starting.   - If you are between ages 36 and 66 years, and you have had normal Pap tests going back 10 years, you no longer need Pap tests. Even if you no longer need a Pap test, a regular exam is a good idea to make sure no other problems are starting.   - If you have had past treatment for cervical cancer or a condition that could lead to cancer, you need Pap tests and screening for cancer for at least 20 years after your treatment.   - If Pap tests have been discontinued, risk factors (such as a new sexual partner) need to be reassessed to determine if screening should  be resumed.   - The HPV test is an additional test that may be used for cervical cancer screening. The HPV test looks for the virus that can cause the cell changes on the cervix. The cells collected during the Pap test can be tested for HPV. The HPV test could be used to screen women aged 70 years and older, and should be used in women of any age who have unclear Pap test results. After the age of 67, women should have HPV testing at the same frequency as a Pap test.   Colorectal cancer can be detected and often prevented. Most routine colorectal cancer screening begins at the age of 57 years and continues through age 26 years. However, your health care provider may recommend screening at an earlier age if you have risk factors for colon cancer. On a yearly basis, your health care provider may provide home test kits to check for hidden blood in the stool.  Use of a small camera at the end of a tube, to directly examine the colon (sigmoidoscopy or colonoscopy), can detect the earliest forms of colorectal cancer. Talk to your health care provider about this at age 23, when routine screening begins. Direct exam of the colon should be repeated every 5 -10 years through age 49 years, unless early forms of pre-cancerous polyps or small growths are found.   People who are at an increased risk for hepatitis B should be screened for this virus. You are considered at high risk for hepatitis B if:  -You were born in a country where hepatitis B occurs often. Talk with your health care provider about which countries are considered high risk.  - Your parents were born in a high-risk country and you have not received a shot to protect against hepatitis B (hepatitis B vaccine).  - You have HIV or AIDS.  - You use needles to inject street drugs.  - You live with, or have sex with, someone who has Hepatitis B.  - You get hemodialysis treatment.  - You take certain medicines for conditions like cancer, organ  transplantation, and autoimmune conditions.   Hepatitis C blood testing is recommended for all people born from [birth year] through 1965 and any individual  with known risks for hepatitis C.   Practice safe sex. Use condoms and avoid high-risk sexual practices to reduce the spread of sexually transmitted infections (STIs). STIs include gonorrhea, chlamydia, syphilis, trichomonas, herpes, HPV, and human immunodeficiency virus (HIV). Herpes, HIV, and HPV are viral illnesses that have no cure. They can result in disability, cancer, and death. Sexually active women aged 25 years and younger should be checked for chlamydia. Older women with new or multiple partners should also be tested for chlamydia. Testing for other STIs is recommended if you are sexually active and at increased risk.   Osteoporosis is a disease in which the bones lose minerals and strength with aging. This can result in serious bone fractures or breaks. The risk of osteoporosis can be identified using a bone density scan. Women ages 65 years and over and women at risk for fractures or osteoporosis should discuss screening with their health care providers. Ask your health care provider whether you should take a calcium supplement or vitamin D to There are also several preventive steps women can take to avoid osteoporosis and resulting fractures or to keep osteoporosis from worsening. -->Recommendations include:  Eat a balanced diet high in fruits, vegetables, calcium, and vitamins.  Get enough calcium. The recommended total intake of is 1,200 mg daily; for best absorption, if taking supplements, divide doses into 250-500 mg doses throughout the day. Of the two types of calcium, calcium carbonate is best absorbed when taken with food but calcium citrate can be taken on an empty stomach.  Get enough vitamin D. NAMS and the National Osteoporosis Foundation recommend at least 1,000 IU per day for women age 50 and over who are at risk of vitamin D  deficiency. Vitamin D deficiency can be caused by inadequate sun exposure (for example, those who live in northern latitudes).  Avoid alcohol and smoking. Heavy alcohol intake (more than 7 drinks per week) increases the risk of falls and hip fracture and women smokers tend to lose bone more rapidly and have lower bone mass than nonsmokers. Stopping smoking is one of the most important changes women can make to improve their health and decrease risk for disease.  Be physically active every day. Weight-bearing exercise (for example, fast walking, hiking, jogging, and weight training) may strengthen bones or slow the rate of bone loss that comes with aging. Balancing and muscle-strengthening exercises can reduce the risk of falling and fracture.  Consider therapeutic medications. Currently, several types of effective drugs are available. Healthcare providers can recommend the type most appropriate for each woman.  Eliminate environmental factors that may contribute to accidents. Falls cause nearly 90% of all osteoporotic fractures, so reducing this risk is an important bone-health strategy. Measures include ample lighting, removing obstructions to walking, using nonskid rugs on floors, and placing mats and/or grab bars in showers.  Be aware of medication side effects. Some common medicines make bones weaker. These include a type of steroid drug called glucocorticoids used for arthritis and asthma, some antiseizure drugs, certain sleeping pills, treatments for endometriosis, and some cancer drugs. An overactive thyroid gland or using too much thyroid hormone for an underactive thyroid can also be a problem. If you are taking these medicines, talk to your doctor about what you can do to help protect your bones.reduce the rate of osteoporosis.    Menopause can be associated with physical symptoms and risks. Hormone replacement therapy is available to decrease symptoms and risks. You should talk to your  health care provider   about whether hormone replacement therapy is right for you.   Use sunscreen. Apply sunscreen liberally and repeatedly throughout the day. You should seek shade when your shadow is shorter than you. Protect yourself by wearing long sleeves, pants, a wide-brimmed hat, and sunglasses year round, whenever you are outdoors.   Once a month, do a whole body skin exam, using a mirror to look at the skin on your back. Tell your health care provider of new moles, moles that have irregular borders, moles that are larger than a pencil eraser, or moles that have changed in shape or color.   -Stay current with required vaccines (immunizations).   Influenza vaccine. All adults should be immunized every year.  Tetanus, diphtheria, and acellular pertussis (Td, Tdap) vaccine. Pregnant women should receive 1 dose of Tdap vaccine during each pregnancy. The dose should be obtained regardless of the length of time since the last dose. Immunization is preferred during the 27th 36th week of gestation. An adult who has not previously received Tdap or who does not know her vaccine status should receive 1 dose of Tdap. This initial dose should be followed by tetanus and diphtheria toxoids (Td) booster doses every 10 years. Adults with an unknown or incomplete history of completing a 3-dose immunization series with Td-containing vaccines should begin or complete a primary immunization series including a Tdap dose. Adults should receive a Td booster every 10 years.  Varicella vaccine. An adult without evidence of immunity to varicella should receive 2 doses or a second dose if she has previously received 1 dose. Pregnant females who do not have evidence of immunity should receive the first dose after pregnancy. This first dose should be obtained before leaving the health care facility. The second dose should be obtained 4 8 weeks after the first dose.  Human papillomavirus (HPV) vaccine. Females aged 13 26  years who have not received the vaccine previously should obtain the 3-dose series. The vaccine is not recommended for use in pregnant females. However, pregnancy testing is not needed before receiving a dose. If a female is found to be pregnant after receiving a dose, no treatment is needed. In that case, the remaining doses should be delayed until after the pregnancy. Immunization is recommended for any person with an immunocompromised condition through the age of 26 years if she did not get any or all doses earlier. During the 3-dose series, the second dose should be obtained 4 8 weeks after the first dose. The third dose should be obtained 24 weeks after the first dose and 16 weeks after the second dose.  Zoster vaccine. One dose is recommended for adults aged 60 years or older unless certain conditions are present.  Measles, mumps, and rubella (MMR) vaccine. Adults born before 1957 generally are considered immune to measles and mumps. Adults born in 1957 or later should have 1 or more doses of MMR vaccine unless there is a contraindication to the vaccine or there is laboratory evidence of immunity to each of the three diseases. A routine second dose of MMR vaccine should be obtained at least 28 days after the first dose for students attending postsecondary schools, health care workers, or international travelers. People who received inactivated measles vaccine or an unknown type of measles vaccine during 1963 1967 should receive 2 doses of MMR vaccine. People who received inactivated mumps vaccine or an unknown type of mumps vaccine before 1979 and are at high risk for mumps infection should consider immunization with 2 doses of   MMR vaccine. For females of childbearing age, rubella immunity should be determined. If there is no evidence of immunity, females who are not pregnant should be vaccinated. If there is no evidence of immunity, females who are pregnant should delay immunization until after pregnancy.  Unvaccinated health care workers born before [birth year] who lack laboratory evidence of measles, mumps, or rubella immunity or laboratory confirmation of disease should consider measles and mumps immunization with 2 doses of MMR vaccine or rubella immunization with 1 dose of MMR vaccine.  Pneumococcal 13-valent conjugate (PCV13) vaccine. When indicated, a person who is uncertain of her immunization history and has no record of immunization should receive the PCV13 vaccine. An adult aged 54 years or older who has certain medical conditions and has not been previously immunized should receive 1 dose of PCV13 vaccine. This PCV13 should be followed with a dose of pneumococcal polysaccharide (PPSV23) vaccine. The PPSV23 vaccine dose should be obtained at least 8 weeks after the dose of PCV13 vaccine. An adult aged 58 years or older who has certain medical conditions and previously received 1 or more doses of PPSV23 vaccine should receive 1 dose of PCV13. The PCV13 vaccine dose should be obtained 1 or more years after the last PPSV23 vaccine dose.  Pneumococcal polysaccharide (PPSV23) vaccine. When PCV13 is also indicated, PCV13 should be obtained first. All adults aged 58 years and older should be immunized. An adult younger than age 65 years who has certain medical conditions should be immunized. Any person who resides in a nursing home or long-term care facility should be immunized. An adult smoker should be immunized. People with an immunocompromised condition and certain other conditions should receive both PCV13 and PPSV23 vaccines. People with human immunodeficiency virus (HIV) infection should be immunized as soon as possible after diagnosis. Immunization during chemotherapy or radiation therapy should be avoided. Routine use of PPSV23 vaccine is not recommended for American Indians, [place] Natives, or people younger than 65 years unless there are medical conditions that require PPSV23 vaccine. When indicated,  people who have unknown immunization and have no record of immunization should receive PPSV23 vaccine. One-time revaccination 5 years after the first dose of PPSV23 is recommended for people aged 70 64 years who have chronic kidney failure, nephrotic syndrome, asplenia, or immunocompromised conditions. People who received 1 2 doses of PPSV23 before age 32 years should receive another dose of PPSV23 vaccine at age 96 years or later if at least 5 years have passed since the previous dose. Doses of PPSV23 are not needed for people immunized with PPSV23 at or after age 55 years.  Meningococcal vaccine. Adults with asplenia or persistent complement component deficiencies should receive 2 doses of quadrivalent meningococcal conjugate (MenACWY-D) vaccine. The doses should be obtained at least 2 months apart. Microbiologists working with certain meningococcal bacteria, [employer] recruits, people at risk during an outbreak, and people who travel to or live in countries with a high rate of meningitis should be immunized. A first-year college student up through age 58 years who is living in a residence hall should receive a dose if she did not receive a dose on or after her 16th birthday. Adults who have certain high-risk conditions should receive one or more doses of vaccine.  Hepatitis A vaccine. Adults who wish to be protected from this disease, have certain high-risk conditions, work with hepatitis A-infected animals, work in hepatitis A research labs, or travel to or work in countries with a high rate of hepatitis A should be  immunized. Adults who were previously unvaccinated and who anticipate close contact with an international adoptee during the first 60 days after arrival in the [place] from a country with a high rate of hepatitis A should be immunized.  Hepatitis B vaccine.  Adults who wish to be protected from this disease, have certain high-risk conditions, may be exposed to blood or other infectious  body fluids, are household contacts or sex partners of hepatitis B positive people, are clients or workers in certain care facilities, or travel to or work in countries with a high rate of hepatitis B should be immunized.  Haemophilus influenzae type b (Hib) vaccine. A previously unvaccinated person with asplenia or sickle cell disease or having a scheduled splenectomy should receive 1 dose of Hib vaccine. Regardless of previous immunization, a recipient of a hematopoietic stem cell transplant should receive a 3-dose series 6 12 months after her successful transplant. Hib vaccine is not recommended for adults with HIV infection.  Preventive Services / Frequency Ages 6 to 39years  Blood pressure check.** / Every 1 to 2 years.  Lipid and cholesterol check.** / Every 5 years beginning at age 39.  Clinical breast exam.** / Every 3 years for women in their 61s and 62s.  BRCA-related cancer risk assessment.** / For women who have family members with a BRCA-related cancer (breast, ovarian, tubal, or peritoneal cancers).  Pap test.** / Every 2 years from ages 47 through 85. Every 3 years starting at age 34 through age 12 or 74 with a history of 3 consecutive normal Pap tests.  HPV screening.** / Every 3 years from ages 46 through ages 43 to 54 with a history of 3 consecutive normal Pap tests.  Hepatitis C blood test.** / For any individual with known risks for hepatitis C.  Skin self-exam. / Monthly.  Influenza vaccine. / Every year.  Tetanus, diphtheria, and acellular pertussis (Tdap, Td) vaccine.** / Consult your health care provider. Pregnant women should receive 1 dose of Tdap vaccine during each pregnancy. 1 dose of Td every 10 years.  Varicella vaccine.** / Consult your health care provider. Pregnant females who do not have evidence of immunity should receive the first dose after pregnancy.  HPV vaccine. / 3 doses over 6 months, if 64 and younger. The vaccine is not recommended for use in  pregnant females. However, pregnancy testing is not needed before receiving a dose.  Measles, mumps, rubella (MMR) vaccine.** / You need at least 1 dose of MMR if you were born in 1957 or later. You may also need a 2nd dose. For females of childbearing age, rubella immunity should be determined. If there is no evidence of immunity, females who are not pregnant should be vaccinated. If there is no evidence of immunity, females who are pregnant should delay immunization until after pregnancy.  Pneumococcal 13-valent conjugate (PCV13) vaccine.** / Consult your health care provider.  Pneumococcal polysaccharide (PPSV23) vaccine.** / 1 to 2 doses if you smoke cigarettes or if you have certain conditions.  Meningococcal vaccine.** / 1 dose if you are age 71 to 37 years and a Market researcher living in a residence hall, or have one of several medical conditions, you need to get vaccinated against meningococcal disease. You may also need additional booster doses.  Hepatitis A vaccine.** / Consult your health care provider.  Hepatitis B vaccine.** / Consult your health care provider.  Haemophilus influenzae type b (Hib) vaccine.** / Consult your health care provider.  Ages 55 to 64years  Blood pressure check.** / Every 1 to 2 years.  Lipid and cholesterol check.** / Every 5 years beginning at age 20 years.  Lung cancer screening. / Every year if you are aged 55 80 years and have a 30-pack-year history of smoking and currently smoke or have quit within the past 15 years. Yearly screening is stopped once you have quit smoking for at least 15 years or develop a health problem that would prevent you from having lung cancer treatment.  Clinical breast exam.** / Every year after age 40 years.  BRCA-related cancer risk assessment.** / For women who have family members with a BRCA-related cancer (breast, ovarian, tubal, or peritoneal cancers).  Mammogram.** / Every year beginning at age 40  years and continuing for as long as you are in good health. Consult with your health care provider.  Pap test.** / Every 3 years starting at age 30 years through age 65 or 70 years with a history of 3 consecutive normal Pap tests.  HPV screening.** / Every 3 years from ages 30 years through ages 65 to 70 years with a history of 3 consecutive normal Pap tests.  Fecal occult blood test (FOBT) of stool. / Every year beginning at age 50 years and continuing until age 75 years. You may not need to do this test if you get a colonoscopy every 10 years.  Flexible sigmoidoscopy or colonoscopy.** / Every 5 years for a flexible sigmoidoscopy or every 10 years for a colonoscopy beginning at age 50 years and continuing until age 75 years.  Hepatitis C blood test.** / For all people born from 1945 through 1965 and any individual with known risks for hepatitis C.  Skin self-exam. / Monthly.  Influenza vaccine. / Every year.  Tetanus, diphtheria, and acellular pertussis (Tdap/Td) vaccine.** / Consult your health care provider. Pregnant women should receive 1 dose of Tdap vaccine during each pregnancy. 1 dose of Td every 10 years.  Varicella vaccine.** / Consult your health care provider. Pregnant females who do not have evidence of immunity should receive the first dose after pregnancy.  Zoster vaccine.** / 1 dose for adults aged 60 years or older.  Measles, mumps, rubella (MMR) vaccine.** / You need at least 1 dose of MMR if you were born in 1957 or later. You may also need a 2nd dose. For females of childbearing age, rubella immunity should be determined. If there is no evidence of immunity, females who are not pregnant should be vaccinated. If there is no evidence of immunity, females who are pregnant should delay immunization until after pregnancy.  Pneumococcal 13-valent conjugate (PCV13) vaccine.** / Consult your health care provider.  Pneumococcal polysaccharide (PPSV23) vaccine.** / 1 to 2 doses if  you smoke cigarettes or if you have certain conditions.  Meningococcal vaccine.** / Consult your health care provider.  Hepatitis A vaccine.** / Consult your health care provider.  Hepatitis B vaccine.** / Consult your health care provider.  Haemophilus influenzae type b (Hib) vaccine.** / Consult your health care provider.  Ages 65 years and over  Blood pressure check.** / Every 1 to 2 years.  Lipid and cholesterol check.** / Every 5 years beginning at age 20 years.  Lung cancer screening. / Every year if you are aged 55 80 years and have a 30-pack-year history of smoking and currently smoke or have quit within the past 15 years. Yearly screening is stopped once you have quit smoking for at least 15 years or develop a health problem that   would prevent you from having lung cancer treatment.  Clinical breast exam.** / Every year after age 103 years.  BRCA-related cancer risk assessment.** / For women who have family members with a BRCA-related cancer (breast, ovarian, tubal, or peritoneal cancers).  Mammogram.** / Every year beginning at age 36 years and continuing for as long as you are in good health. Consult with your health care provider.  Pap test.** / Every 3 years starting at age 5 years through age 85 or 10 years with 3 consecutive normal Pap tests. Testing can be stopped between 65 and 70 years with 3 consecutive normal Pap tests and no abnormal Pap or HPV tests in the past 10 years.  HPV screening.** / Every 3 years from ages 93 years through ages 70 or 45 years with a history of 3 consecutive normal Pap tests. Testing can be stopped between 65 and 70 years with 3 consecutive normal Pap tests and no abnormal Pap or HPV tests in the past 10 years.  Fecal occult blood test (FOBT) of stool. / Every year beginning at age 8 years and continuing until age 45 years. You may not need to do this test if you get a colonoscopy every 10 years.  Flexible sigmoidoscopy or colonoscopy.** /  Every 5 years for a flexible sigmoidoscopy or every 10 years for a colonoscopy beginning at age 69 years and continuing until age 68 years.  Hepatitis C blood test.** / For all people born from [birth year] through 1965 and any individual with known risks for hepatitis C.  Osteoporosis screening.** / A one-time screening for women ages 7 years and over and women at risk for fractures or osteoporosis.  Skin self-exam. / Monthly.  Influenza vaccine. / Every year.  Tetanus, diphtheria, and acellular pertussis (Tdap/Td) vaccine.** / 1 dose of Td every 10 years.  Varicella vaccine.** / Consult your health care provider.  Zoster vaccine.** / 1 dose for adults aged 5 years or older.  Pneumococcal 13-valent conjugate (PCV13) vaccine.** / Consult your health care provider.  Pneumococcal polysaccharide (PPSV23) vaccine.** / 1 dose for all adults aged 74 years and older.  Meningococcal vaccine.** / Consult your health care provider.  Hepatitis A vaccine.** / Consult your health care provider.  Hepatitis B vaccine.** / Consult your health care provider.  Haemophilus influenzae type b (Hib) vaccine.** / Consult your health care provider. ** Family history and personal history of risk and conditions may change your health care provider's recommendations. Document Released: 10/04/2001 Document Revised: 05/29/2013  [facility] Patient Information 2014 [place], [place].   EXERCISE AND DIET:  We recommended that you start or continue a regular exercise program for good health. Regular exercise means any activity that makes your heart beat faster and makes you sweat.  We recommend exercising at least 30 minutes per day at least 3 days a week, preferably 5.  We also recommend a diet low in fat and sugar / carbohydrates.  Inactivity, poor dietary choices and obesity can cause diabetes, heart attack, stroke, and kidney damage, among others.     ALCOHOL AND SMOKING:  Women should limit their alcohol intake to no  more than 7 drinks/beers/glasses of wine (combined, not each!) per week. Moderation of alcohol intake to this level decreases your risk of breast cancer and liver damage.  ( And of course, no recreational drugs are part of a healthy lifestyle.)  Also, you should not be smoking at all or even being exposed to second hand smoke. Most people know smoking can  cause cancer, and various heart and lung diseases, but did you know it also contributes to weakening of your bones?  Aging of your skin?  Yellowing of your teeth and nails?   CALCIUM AND VITAMIN D:  Adequate intake of calcium and Vitamin D are recommended.  The recommendations for exact amounts of these supplements seem to change often, but generally speaking 600 mg of calcium (either carbonate or citrate) and 800 units of Vitamin D per day seems prudent. Certain women may benefit from higher intake of Vitamin D.  If you are among these women, your doctor will have told you during your visit.     PAP SMEARS:  Pap smears, to check for cervical cancer or precancers,  have traditionally been done yearly, although recent scientific advances have shown that most women can have pap smears less often.  However, every woman still should have a physical exam from her gynecologist or primary care physician every year. It will include a breast check, inspection of the vulva and vagina to check for abnormal growths or skin changes, a visual exam of the cervix, and then an exam to evaluate the size and shape of the uterus and ovaries.  And after 63 years of age, a rectal exam is indicated to check for rectal cancers. We will also provide age appropriate advice regarding health maintenance, like when you should have certain vaccines, screening for sexually transmitted diseases, bone density testing, colonoscopy, mammograms, etc.    MAMMOGRAMS:  All women over 59 years old should have a yearly mammogram. Many facilities now offer a "3D" mammogram, which may cost  around $50 extra out of pocket. If possible,  we recommend you accept the option to have the 3D mammogram performed.  It both reduces the number of women who will be called back for extra views which then turn out to be normal, and it is better than the routine mammogram at detecting truly abnormal areas.     COLONOSCOPY:  Colonoscopy to screen for colon cancer is recommended for all women at age 47.  We know, you hate the idea of the prep.  We agree, BUT, having colon cancer and not knowing it is worse!!  Colon cancer so often starts as a polyp that can be seen and removed at colonscopy, which can quite literally save your life!  And if your first colonoscopy is normal and you have no family history of colon cancer, most women don't have to have it again for 10 years.  Once every ten years, you can do something that may end up saving your life, right?  We will be happy to help you get it scheduled when you are ready.  Be sure to check your insurance coverage so you understand how much it will cost.  It may be covered as a preventative service at no cost, but you should check your particular policy.    Please re-start Atorvastatin and take once nightly. Continue to drink plenty of water and follow Mediterranean diet. Diagnostic mammogram ordered to address painful lump on right breast. Follow-up in 6 months. GREAT TO SEE YOU!

## 2018-10-23 ENCOUNTER — Other Ambulatory Visit: Payer: Self-pay | Admitting: Adult Health

## 2018-10-23 DIAGNOSIS — N6310 Unspecified lump in the right breast, unspecified quadrant: Secondary | ICD-10-CM

## 2018-10-24 ENCOUNTER — Encounter: Payer: Self-pay | Admitting: Adult Health

## 2018-10-29 ENCOUNTER — Ambulatory Visit
Admission: RE | Admit: 2018-10-29 | Discharge: 2018-10-29 | Disposition: A | Discharge status: Home / Self Care | Payer: 59 | Source: Ambulatory Visit | Attending: Adult Health | Admitting: Adult Health

## 2018-10-29 DIAGNOSIS — N6310 Unspecified lump in the right breast, unspecified quadrant: Secondary | ICD-10-CM

## 2018-11-14 ENCOUNTER — Other Ambulatory Visit: Payer: Self-pay | Admitting: Adult Health

## 2018-11-14 NOTE — Telephone Encounter (Signed)
Patient called to request refill on these medications :   1)---butalbital-acetaminophen-caffeine (FIORICET, ESGIC) 50-325-40 MG tablet [[id number]]   Order Details  Dose: 1 tablet Route: Oral Frequency: Every 6 hours PRN  Dispense Quantity: -- Refills: -- Fills remaining: --        Sig: Take 1 tablet by mouth every 6 (six) hours as needed.          &   2)---oxybutynin (DITROPAN-XL) 5 MG 24 hr tablet [[id number]]   Order Details  Dose: -- Route: -- Frequency: --  Dispense Quantity: -- Refills: -- Fills remaining: --        Sig: --       Written Date: -- Expiration Date: -- Ordering Date: 01/24/18   Start Date: 01/03/18 End Date: --    Source:  Received from: External Pharmacy       Ordering Provider: -- DEA #:  -- NPI:  --   Authorizing Provider: [provider] DEA #:  -- NPI:  [id number]   Ordering User:  Glennie [place], CMA        ----[place] request to medical assistant to send refill order to :   [facility] [place], [place] - [place] [phone] (Phone) [phone] (Fax   --glh

## 2018-11-15 MED ORDER — OXYBUTYNIN CHLORIDE ER 5 MG PO TB24: 5.0000 mg | ORAL_TABLET | Freq: Every day | ORAL | 0 refills | Status: DC

## 2018-11-15 MED ORDER — BUTALBITAL-APAP-CAFFEINE 50-325-40 MG PO TABS: 1.0000 | ORAL_TABLET | Freq: Four times a day (QID) | ORAL | 0 refills | Status: DC | PRN

## 2018-11-15 NOTE — Telephone Encounter (Signed)
We have not prescribed these medications for the patient previously.  Please review and refill if appropriate.  Charyl Bigger, CMA

## 2018-12-11 ENCOUNTER — Encounter: Payer: Self-pay | Admitting: Gastroenterology

## 2018-12-11 ENCOUNTER — Other Ambulatory Visit: Payer: Self-pay

## 2018-12-11 ENCOUNTER — Ambulatory Visit (INDEPENDENT_AMBULATORY_CARE_PROVIDER_SITE_OTHER): Payer: 59 | Admitting: Gastroenterology

## 2018-12-11 VITALS — Ht 64.0 in | Wt 145.0 lb

## 2018-12-11 DIAGNOSIS — R109 Unspecified abdominal pain: Secondary | ICD-10-CM

## 2018-12-11 DIAGNOSIS — K529 Noninfective gastroenteritis and colitis, unspecified: Secondary | ICD-10-CM

## 2018-12-11 DIAGNOSIS — K219 Gastro-esophageal reflux disease without esophagitis: Secondary | ICD-10-CM | POA: Diagnosis not present

## 2018-12-11 DIAGNOSIS — K449 Diaphragmatic hernia without obstruction or gangrene: Secondary | ICD-10-CM | POA: Diagnosis not present

## 2018-12-11 NOTE — Patient Instructions (Addendum)
If you are age 63 or older, your body mass index should be between 23-30. Your Body mass index is 24.89 kg/m. If this is out of the aforementioned range listed, please consider follow up with your Primary Care Provider.  If you are age 39 or younger, your body mass index should be between 19-25. Your Body mass index is 24.89 kg/m. If this is out of the aformentioned range listed, please consider follow up with your Primary Care Provider.   Please go to the lab in the basement of our building to have lab work done (Fecal Calprotectin). Hit "B" for basement when you get on the elevator.  When the doors open the lab is on your left.  We will call you with the results. Thank you.  We have scheduled you for a follow up visit with Dr. Havery Moros on Friday, May 29th at 9:30am.  Budesonide taper:  If you are taking 3 mg daily, please take every other day for 2 weeks and then STOP. However, If you have been taking 9 mg daily, please decrease to 6 mg daily for 2 weeks, then decrease to 3 mg daily for 2 weeks then STOP. Please call if you have any questions.   Thank you for entrusting me with your care and for choosing [facility], Dr. Ipava Cellar

## 2018-12-11 NOTE — Progress Notes (Signed)
Virtual Visit via Video Note  I connected with Jaeliana Lococo on 12/11/18 at  9:30 AM EDT by a video enabled telemedicine application and verified that I am speaking with the correct person using two identifiers.   I discussed the limitations of evaluation and management by telemedicine and the availability of in person appointments. The patient expressed understanding and agreed to proceed.  THIS ENCOUNTER IS A VIRTUAL VISIT DUE TO COVID-19 - PATIENT WAS NOT SEEN IN THE OFFICE. PATIENT HAS CONSENTED TO VIRTUAL VISIT / TELEMEDICINE VISIT USING DOXIMITY APP   Location of patient: home Location of provider: office Name of referring provider: Mina Marble NP Persons participating: myself, patient   HPI :  63 y/o female with a history of GERD, hiatal hernia, abdominal pain, ileitis depression referred by Mina Marble NP for a second opinion for abdominal pain.   She has had abdominal pain which started last year. She describes 2 different types of pain she has developed. One pain is in the RLQ, she called her "ileitis" pain, . This started about 1.5 years ago, occurs sporadically without any clear triggers, comes and goes, eating does not make it worse. No diarrhea at baseline. Occasional constipation. No blood in the stools. She has another type of pain that involves her "entire abdomen" and is diffuse in nature. This does not occur at the same time as her RLQ pain and she thinks is different, but also started around the same time. She reports comes / goes. She has it every day, sometimes all day, sometimes an hour. Pain is rated 3/10 at baseline but can go to 8/10 without clear triggers. This can wake her at night. Certain positions can make it worse - she can't lie on her stomach or left side due to pain. She reports this has been ongoing for the past year and a half. She states this pain cannot be localized, it is diffuse, both above and below the umbilicus and equally on both sides left and  right. Her abdomen is tender "all over". No rashes. She can't think of any precipitants for that pain. Sometimes she would wake up with severe pain associated with vomiting. At baseline she does not have nausea or vomiting and is eating well.   She otherwise has a history of reflux and regurgitation that bothers her. During her workup below she has a moderate sized paraesophageal hernia. She mostly has regurgitation after she eats certain foods, does not have much pyrosis while taking omeprazole 84m / day. She does have dysphagia periodically, usually with bulkier foods. She is able to function okay with these symptoms and does not bother her significantly.  [facility]. During this course in 2019 she was hospitalized twice for pain, initial CT scan on 5/13 looked okay, but she had another CT scan on 5/15 which showed terminal ileitis. She had a follow up CT enterography showing fatty infiltration of the TI but no severe inflammation. Repeat colonoscopy on 03/30/18 showed some erosions and inflammation in the ileum. Biopsies showed nonspecific ileitis without chronicity or granulomas. She denied any routine NSAID use around this time. She was given a course of budesonide which helped her RLQ pain. She states she was "given a years worth" and has been maintained on this and has not stopped it. More recently she has been taking some NSAIDs. Ibuprofen three times per week or so. For the most part the RLQ pain has not  been bothering her too much at this point while on the budesonide. Dicyclomine has not helped. She has been on gabapentin for chronic leg pain following cellulitis, she does not take it routinely.  She has no FH of Crohn's or colitis. No FH of colon cancer. No tobacco.  Prior workup as outlined: Previously followed by Dr. Darlin Coco, [facility].  Negative stool studies for infection CBC normal, LFTs normal, TSH normal ESR 16, CRP 0.53,  fecal calprotectin 183 (H)  Barium swallow - reflux noted, paraesophageal hernia, mild dysmotility Esophageal manometry done for dysphagia 04/05/2018 - normal outside of [facility] Colonoscopy 03/30/2018 - one small adenoma, ileum with multiple non-bleeding erosions, multiple small mouthed diverticuli in sigmoid colon, 53m adenoma sigmoid colon - path shows "active ileitis with ulceration" - no evidence of chronicity or granulomas, nonspecific. Normal biopsies of the colon. CT enterography 03/06/2018 - moderate paraesophageal hernia, fatty infiltration of the TI, normal small bowel and colon otherwise CT scan 01/03/18 - fluid filled but nonenlarged ileum, mild terminal ileitis, paraesophageal hiatal hernia CT scan 01/01/18 - normal bowel, fatty liver, fatty infiltration of TI, paraesophageal hernia EGD 10/09/17 - normal duodenal biopsies, normal gastric biopsies, negative for HP, esophageal biopsies with esophagitis and hyperkeratosis [facility] 09/01/2017 - mild increase echogenicity of the wall of gallbladder wall, otherwise normal Colonoscopy 10/09/17 - normal TI, mild diverticulosis, 467madenoma sigmoid colon HIDA scan 09/22/2017 - normal, EF 69.4%   Past Medical History:  Diagnosis Date   Depression .   Hiatal hernia    Ileitis      Past Surgical History:  Procedure Laterality Date   ABDOMINAL HYSTERECTOMY     BREAST EXCISIONAL BIOPSY Right    BREAST SURGERY     Tumor Removal   CESAREAN SECTION     KNEE SURGERY     TONSILLECTOMY     Family History  Problem Relation Age of Onset   Alcohol abuse Mother    Stroke Mother    Diabetes Father    Depression Paternal Grandmother    Social History   Tobacco Use   Smoking status: Current Some Day Smoker    Years: 5.00    Types: Cigarettes   Smokeless tobacco: Never Used   Tobacco comment: 1-2 cigarettes per day  Substance Use Topics   Alcohol use: Yes    Alcohol/week: 9.0 standard drinks    Types: 9 Glasses of wine per week    Drug use: Never   Current Outpatient Medications  Medication Sig Dispense Refill   atorvastatin (LIPITOR) 10 MG tablet Take by mouth.     budesonide (ENTOCORT EC) 3 MG 24 hr capsule Take 3 mg by mouth daily.     buPROPion (WELLBUTRIN XL) 300 MG 24 hr tablet TAKE 1 TABLET BY MOUTH EVERY DAY     butalbital-acetaminophen-caffeine (FIORICET, ESGIC) 50-325-40 MG tablet Take 1 tablet by mouth every 6 (six) hours as needed. 14 tablet 0   cyanocobalamin 2000 MCG tablet Take 2,000 mcg by mouth daily.     estradiol (VIVELLE-DOT) 0.1 MG/24HR patch Place onto the skin.     FLUoxetine (PROZAC) 40 MG capsule TAKE 1 CAPSULE BY MOUTH EVERY DAY     gabapentin (NEURONTIN) 600 MG tablet Take 600 mg by mouth daily.     omeprazole (PRILOSEC) 40 MG capsule Take 40 mg by mouth daily.     oxybutynin (DITROPAN-XL) 5 MG 24 hr tablet Take 1 tablet (5 mg total) by mouth at bedtime. 90 tablet 0   No current  facility-administered medications for this visit.    Allergies  Allergen Reactions   Etodolac Other (See Comments)    Sleepiness, feeling "out of it" Sleepiness, feeling "out of it"    Naproxen    Tizanidine Other (See Comments)   Zanaflex [Tizanidine Hcl]      Review of Systems: All systems reviewed and negative except where noted in HPI.    Labs as outlined above  Physical Exam: NA   ASSESSMENT AND PLAN:  63 y/o female here for a new patient visit regarding the following:  Abdominal pain / Ileitis - history as above, extensive evaluation. CT imaging with ileitis at one point in time with endoscopic evidence of mild inflammatory change of the ileum with nonspecific ileitis on path in the setting of an elevated fecal calprotectin. I discussed ddx with her. It is certainly possible she has mild ileal Crohn's disease given his history, other etiologies include NSAID use (she denied at the time of prior testing), other vasculitides (although prior inflammatory markers normal). She clearly  noticed a benefit from budesonide in regards to her RLQ pain which has mostly resolved, but it has not changed her diffuse abdominal pain at all. Thus, while it's possible she has mild Crohn's ileitis causing her RLQ pain, I think it very unlikely this mild ileitis is causing her diffuse severe abdominal pain, which is her main complaint. She has not had any other pathology noted on CT scans to cause this severe diffuse pain, I suspect this could be musculoskeletal or neuropathic given positional changes, etc, she endorses, although this visit is limited in that I cannot examine her in this light. I discussed options moving forward.  She had been on budesonide 54m / day for the past year, I don[facility] was supposed to be a trial for a few weeks. She has had benefit in the RLQ, but I think we need to taper this off, will go to 626m/ day for 3 weeks, then 42m742m day for 3 weeks, and then stop. I will otherwise recheck a fecal calprotectin to see if that has normalized on therapy or if it is still elevated. If it has normalized, I think it unlikely she has any significant ileitis at this time. We discussed the possibility of Crohn's disease, if she has this how she would want to be treated. She has no anemia and RLQ has stopped, it may be reasonable to give her budesonide for short courses PRN and monitor this with imaging or colonoscopy over time. We discussed other therapies such as biologics, etc. If she had active disease that is associated with her other symptoms I would certainly recommend it, but again I'm not convinced this mild ileitis is causing all of her pain, as outlined. I will need to see her in clinic to examine her abdomen, will plan on this in the upcoming weeks, and we may consider follow up imaging ([facility]) to see if anything has changed over the past year. She was agreeable to this. Recommend she avoid NSAIDs and use tylenol moving forward. If she  notes she feels much worse after coming off budesonide, we could also consider a course of prednisone to see if that changes anything.   GERD / hiatal hernia - sounds like she has regurgitation and dysphagia likely due to moderate sized paraesophageal hernia. Manometry normal and no stricture on EGD or barium study. Symptoms of pyrosis managed with PPI, regurgitation not  bothering her too much right now. If regurgitation really bothers her despite meds, one could consider surgical repair of the hernia, we discussed what that would entail and she doesn't want that right now. She can follow up PRN for this issue.  [place] Cellar, MD [facility]  CC: Esaw Grandchild, NP

## 2018-12-13 ENCOUNTER — Other Ambulatory Visit: Payer: Self-pay

## 2018-12-13 MED ORDER — BUDESONIDE 3 MG PO CPEP: ORAL_CAPSULE | ORAL | 0 refills | Status: AC

## 2018-12-13 NOTE — Progress Notes (Signed)
Per visit with Dr. Havery Moros on 12-11-2018, pt will taper budesonide 6mg  daily for 2 weeks then 3mg  daily for 2 weeks then stop.  Script sent.

## 2018-12-14 ENCOUNTER — Encounter: Payer: Self-pay | Admitting: Adult Health

## 2018-12-17 ENCOUNTER — Other Ambulatory Visit: Payer: Self-pay

## 2018-12-17 MED ORDER — ATORVASTATIN CALCIUM 10 MG PO TABS: 10.0000 mg | ORAL_TABLET | Freq: Every day | ORAL | 1 refills | Status: DC

## 2018-12-17 NOTE — Telephone Encounter (Signed)
We have not prescribed these medications for the patient previously.  Please review and refill if appropriate.  T. Gurdeep Keesey, CMA  

## 2018-12-31 ENCOUNTER — Other Ambulatory Visit: Payer: Self-pay | Admitting: Adult Health

## 2018-12-31 NOTE — Telephone Encounter (Signed)
We have not prescribed these medications for the patient previously.  Please review and refill if appropriate.  T. Ameliyah Sarno, CMA  

## 2019-01-03 ENCOUNTER — Other Ambulatory Visit: Payer: Self-pay | Admitting: Adult Health

## 2019-01-03 MED ORDER — BUTALBITAL-APAP-CAFFEINE 50-325-40 MG PO TABS: 1.0000 | ORAL_TABLET | Freq: Four times a day (QID) | ORAL | 0 refills | Status: DC | PRN

## 2019-01-03 NOTE — Telephone Encounter (Signed)
Spoke with pt.  Answers to your questions are as follows:  Frequency:  Approximately once weekly Aura:  No aura No N/V Alternative treatments tried:  Dark / quiet room, rest  Charyl Bigger, CMA

## 2019-01-03 NOTE — Telephone Encounter (Signed)
Good Afternoon Hailey Galvan, Can you please all Hailey Galvan and ask the following questions: How often is she experiencing migraine HAs? Does she experience aura with HA? Does she experience N/V with HA? What other treatments does she use for HA pain, TK:PTWS, avoiding noise/light Thanks! Valetta Fuller

## 2019-01-03 NOTE — Telephone Encounter (Signed)
Pt informed RX sent to pharmacy.  Charyl Bigger, CMA

## 2019-01-13 ENCOUNTER — Encounter: Payer: Self-pay | Admitting: Adult Health

## 2019-01-15 ENCOUNTER — Other Ambulatory Visit: Payer: Self-pay

## 2019-01-15 NOTE — Telephone Encounter (Signed)
We have not prescribed these medications for the patient previously.  Please review and refill if appropriate.  Charyl Bigger, CMA

## 2019-01-15 NOTE — Telephone Encounter (Signed)
Good Morning Hailey Galvan, Per protocol we do not Rx hormone replacement therapy. Would she like a referral to GYN? Thanks! Valetta Fuller

## 2019-01-15 NOTE — Telephone Encounter (Signed)
MyChart message sent to pt with info.  Charyl Bigger, CMA

## 2019-01-17 ENCOUNTER — Encounter: Payer: Self-pay | Admitting: General Surgery

## 2019-01-18 ENCOUNTER — Telehealth: Payer: Self-pay

## 2019-01-18 ENCOUNTER — Encounter: Payer: Self-pay | Admitting: Gastroenterology

## 2019-01-18 ENCOUNTER — Ambulatory Visit: Payer: 59 | Admitting: Gastroenterology

## 2019-01-18 ENCOUNTER — Other Ambulatory Visit: Payer: Self-pay

## 2019-01-18 ENCOUNTER — Other Ambulatory Visit: Payer: 59

## 2019-01-18 VITALS — BP 98/58 | HR 69 | Temp 98.3°F | Ht 64.0 in | Wt 149.0 lb

## 2019-01-18 DIAGNOSIS — R109 Unspecified abdominal pain: Secondary | ICD-10-CM

## 2019-01-18 DIAGNOSIS — K219 Gastro-esophageal reflux disease without esophagitis: Secondary | ICD-10-CM

## 2019-01-18 DIAGNOSIS — K529 Noninfective gastroenteritis and colitis, unspecified: Secondary | ICD-10-CM

## 2019-01-18 DIAGNOSIS — K449 Diaphragmatic hernia without obstruction or gangrene: Secondary | ICD-10-CM

## 2019-01-18 MED ORDER — OMEPRAZOLE 40 MG PO CPDR: 40.0000 mg | DELAYED_RELEASE_CAPSULE | Freq: Two times a day (BID) | ORAL | 1 refills | Status: DC

## 2019-01-18 MED ORDER — CYCLOBENZAPRINE HCL 10 MG PO TABS: 10.0000 mg | ORAL_TABLET | Freq: Every day | ORAL | 3 refills | Status: DC

## 2019-01-18 NOTE — Progress Notes (Signed)
HPI :  63 y/o female here for a follow up visit. Please see prior intake history for full details of her case.  Since our last visit we tapered her off the budesonide. [facility] with no follow up. She tapered to 6635m/ day and then to 35m80m day for a period of time and then off and did this well without changes to how she is feeling.  She has not had any RLQ pain that has recurred. She continues to have diffuse abdominal pain in the mid to lower abdomen which bothers her. Description of this is same as the last visit.  Occasional constipation. No blood in the stools. She reports pain comes / goes. She has it every day, sometimes all day, sometimes an hour. Pain is rated 3/10 at baseline but can go to 8/10 without clear triggers. This can wake her at night. Certain positions can make it worse - she can't lie on her stomach or left side due to pain, also right side bothers her. She reports this has been ongoing for the past year and a half. She states this pain cannot be localized, it is diffuse, both above and below the umbilicus and equally on both sides left and right. Her abdomen is tender "all over". No rashes. She can't think of any precipitants for that pain. Sometimes she would wake up with severe pain associated with vomiting. At baseline she does not have nausea or vomiting and is eating well. Eating does not make her pain worse, having a BM does not make her pain worse. The budesonide did not help this pain at all, only her prior RLQ pain which has resolved.  She has been on gabapentin for neuropathy and headaches and no better. She has been on prozac and wellbutrin for history of depression and she states these work quite well for her for this issue. She has not been on a TCA.  She continues to have reflux symptoms which bother her. She has some pyrosis and ongoing regurgitation. Prior workup has revealed moderate  sized paraesophageal hernia. Occasional dysphagia. Currently taking omeprazole once daily.   She has no FH of Crohn's or colitis. No FH of colon cancer. No tobacco.  Prior workup as outlined: Previously followed by Dr. [facility].  Negative stool studies for infection CBC normal, LFTs normal, TSH normal ESR 16, CRP 0.53, fecal calprotectin 183 (H)  Barium swallow - reflux noted, paraesophageal hernia, mild dysmotility Esophageal manometry done for dysphagia 04/05/2018 [facility] one small adenoma, ileum with multiple non-bleeding erosions, multiple small mouthed diverticuli in sigmoid colon, 4mm10menoma sigmoid colon - path shows "active ileitis with ulceration" - no evidence of chronicity or granulomas, nonspecific. Normal biopsies of the colon. CT enterography 03/06/2018 - moderate paraesophageal hernia, fatty infiltration of the TI, normal small bowel and colon otherwise CT scan 01/03/18 - fluid filled but nonenlarged ileum, mild terminal ileitis, paraesophageal hiatal hernia CT scan 01/01/18 - normal bowel, fatty liver, fatty infiltration of TI, paraesophageal hernia EGD 10/09/17 - normal duodenal biopsies, normal gastric biopsies, negative for HP, esophageal biopsies with esophagitis and hyperkeratosis [place] 09/01/2017 - mild increase echogenicity of the wall of gallbladder wall, otherwise normal Colonoscopy 10/09/17 - normal TI, mild diverticulosis, 4mm 80mnoma sigmoid colon HIDA scan 09/22/2017 - normal, EF 69.4%    Past Medical History:  Diagnosis Date  Depression .   Hiatal hernia    Ileitis      Past Surgical History:  Procedure Laterality Date   ABDOMINAL HYSTERECTOMY     BREAST EXCISIONAL BIOPSY Right    BREAST SURGERY     Tumor Removal   CESAREAN SECTION     KNEE SURGERY     TONSILLECTOMY     Family History  Problem Relation Age of Onset   Alcohol abuse Mother    Stroke Mother    Diabetes Father     Depression Paternal Grandmother    Colon cancer Neg Hx    Esophageal cancer Neg Hx    Social History   Tobacco Use   Smoking status: Former Smoker    Years: 5.00    Types: Cigarettes   Smokeless tobacco: Never Used   Tobacco comment: 1-2 cigarettes per day  Substance Use Topics   Alcohol use: Yes    Alcohol/week: 9.0 standard drinks    Types: 9 Glasses of wine per week   Drug use: Never   Current Outpatient Medications  Medication Sig Dispense Refill   atorvastatin (LIPITOR) 10 MG tablet Take 1 tablet (10 mg total) by mouth daily at 6 PM. 90 tablet 1   buPROPion (WELLBUTRIN XL) 300 MG 24 hr tablet TAKE 1 TABLET BY MOUTH EVERY DAY     butalbital-acetaminophen-caffeine (FIORICET) 50-325-40 MG tablet Take 1 tablet by mouth every 6 (six) hours as needed. 14 tablet 0   cyanocobalamin 2000 MCG tablet Take 2,000 mcg by mouth daily.     estradiol (VIVELLE-DOT) 0.1 MG/24HR patch Place onto the skin.     FLUoxetine (PROZAC) 40 MG capsule TAKE 1 CAPSULE BY MOUTH EVERY DAY 90 capsule 0   gabapentin (NEURONTIN) 600 MG tablet Take 600 mg by mouth daily.     omeprazole (PRILOSEC) 40 MG capsule Take 40 mg by mouth daily.     oxybutynin (DITROPAN-XL) 5 MG 24 hr tablet Take 1 tablet (5 mg total) by mouth at bedtime. 90 tablet 0   No current facility-administered medications for this visit.    Allergies  Allergen Reactions   Etodolac Other (See Comments)    Sleepiness, feeling "out of it" Sleepiness, feeling "out of it"    Naproxen    Tizanidine Other (See Comments)   Zanaflex [Tizanidine Hcl]      Review of Systems: All systems reviewed and negative except where noted in HPI.   Lab Results  Component Value Date   WBC 7.1 10/11/2018   HGB 13.1 10/11/2018   HCT 37.9 10/11/2018   MCV 93 10/11/2018   PLT 331 10/11/2018    Lab Results  Component Value Date   CREATININE 0.94 10/11/2018   BUN 12 10/11/2018   NA 140 10/11/2018   K 4.5 10/11/2018   CL 100  10/11/2018   CO2 23 10/11/2018    Lab Results  Component Value Date   ALT 23 10/11/2018   AST 23 10/11/2018   ALKPHOS 107 10/11/2018   BILITOT 0.3 10/11/2018     Physical Exam: Ht 5' 4"  (1.626 m) Comment: pt provided over the phone   Wt 150 lb (68 kg) Comment: pt provided over the phone   BMI 25.75 kg/m  Constitutional: Pleasant,well-developed, female in no acute distress. HEENT: Normocephalic and atraumatic. Conjunctivae are normal. No scleral icterus, wearing mask Neck supple.  Cardiovascular: Normal rate, regular rhythm.  Pulmonary/chest: Effort normal and breath sounds normal. No wheezing, rales or rhonchi. Abdominal: Soft, nondistended, diffusely tender in the  mid to lower abdomen without peritoneal signs. Bowel sounds active throughout. There are no masses palpable. No hepatomegaly. (+) carnett sign but difficult to localize one focal point Extremities: no edema Lymphadenopathy: No cervical adenopathy noted. Neurological: Alert and oriented to person place and time. Skin: Skin is warm and dry. No rashes noted. Psychiatric: Normal mood and affect. Behavior is normal.   ASSESSMENT AND PLAN: 63 y/o female here for reassessment of the following issues:  Abdominal pain / ileitis - history as above, extensive workup at [facility]. She had RLQ pain in the setting of active ileitis on colonoscopy, path nonspecific / nondiagnostic with elevated fecal calprotectin. She was given budesonide with resolution of RLQ pain however had been on that for over a year when I saw her. We have tapered that off and she has been doing the same, no recurrence of RLQ pain. However she continues to have diffuse lower and mid abdominal pain without clear triggers as outlined. She is diffusely tender on exam with (+) Carnett sign, 3 prior CT scans last year have not shown other pathology to cause this. My suspicion is that she may have abdominal wall pain causing her mid to lower abdominal discomfort based on her  course and prior workup. It is certainly possible she has mild Crohn's ileitis that has caused her prior RLQ pain, but such mild disease on imaging and colonoscopy would not be expected to cause the amount of tenderness and exam findings on her abdomen today. We discussed options moving forward. Gabapentin has not helped her abdominal pain, and she is not a good candidate for a TCA given her SSRI use. She does not wish to change the Prozac given it has helped her depression significantly. Will try some flexeril to use q HS to see if that helps relax her abdomen when she sleeps, she can also try some topical muscle rubs. In regards to her prior ileitis, this does need reassessment after her treatment with budesonide. We discussed options to include colonoscopy, MRE / CT enterography, stool studies. She wishes to start with fecal calprotectin to assess for active inflammation. If this is positive, will need reassessment with either MRE or colonoscopy to evaluate more objectively for Crohn's. She agreed with this. Will await stool studies and her course with further recommendations.  GERD - ongoing symptoms despite once daily omeprazole in the setting of moderate paraesophageal hernia. Will increase omeprazole to BID dosing to see if that provides any further relief. If not and her symptoms are persistent despite medical therapy she would need to consider surgical repair of the hiatal hernia. She does not think her symptoms would warrant that as of now, but will await her course on twice daily omeprazole. She agreed.  Toluca Cellar, MD [facility]

## 2019-01-18 NOTE — Telephone Encounter (Signed)
Covid-19 screening questions  Have you traveled in the last 14 days? If yes where? NO  Do you now or have you had a fever in the last 14 days? NO  Do you have any respiratory symptoms of shortness of breath or cough now or in the last 14 days? NO  Do you have any family members or close contacts with diagnosed or suspected Covid-19 in the past 14 days? NO  Have you been tested for Covid-19 and found to be positive? NO

## 2019-01-18 NOTE — Patient Instructions (Addendum)
If you are age 63 or older, your body mass index should be between 23-30. Your Body mass index is 25.58 kg/m. If this is out of the aforementioned range listed, please consider follow up with your Primary Care Provider.  If you are age 73 or younger, your body mass index should be between 19-25. Your Body mass index is 25.58 kg/m. If this is out of the aformentioned range listed, please consider follow up with your Primary Care Provider.   To help prevent the possible spread of infection to our patients, communities, and staff; we will be implementing the following measures:  As of now we are not allowing any visitors/family members to accompany you to any upcoming appointments with [facility]. If you have any concerns about this please contact our office to discuss prior to the appointment.   Please go to the lab in the basement of our building to have lab work done as you leave today. Hit "B" for basement when you get on the elevator.  When the doors open the lab is on your left.  We will call you with the results. Thank you.  We have sent the following medications to your pharmacy for you to pick up at your convenience:  Omeprazole 40mg : Take twice a day Flexeril 10mg : Take every night  Thank you for entrusting me with your care and for choosing  HealthCare, Dr. Pelzer Cellar

## 2019-01-29 ENCOUNTER — Other Ambulatory Visit: Payer: Self-pay

## 2019-01-29 DIAGNOSIS — R109 Unspecified abdominal pain: Secondary | ICD-10-CM

## 2019-01-29 DIAGNOSIS — K50919 Crohn's disease, unspecified, with unspecified complications: Secondary | ICD-10-CM

## 2019-01-29 LAB — SPECIMEN STATUS REPORT

## 2019-01-29 LAB — CALPROTECTIN, FECAL: Calprotectin, Fecal: 243 ug/g — ABNORMAL HIGH (ref 0–120)

## 2019-01-29 MED ORDER — OMEPRAZOLE 40 MG PO CPDR: 40.0000 mg | DELAYED_RELEASE_CAPSULE | Freq: Two times a day (BID) | ORAL | 1 refills | Status: DC

## 2019-02-05 ENCOUNTER — Ambulatory Visit (HOSPITAL_COMMUNITY)
Admission: RE | Admit: 2019-02-05 | Discharge: 2019-02-05 | Disposition: A | Discharge status: Home / Self Care | Payer: 59 | Source: Ambulatory Visit | Attending: Gastroenterology | Admitting: Gastroenterology

## 2019-02-05 ENCOUNTER — Other Ambulatory Visit: Payer: Self-pay

## 2019-02-05 DIAGNOSIS — R109 Unspecified abdominal pain: Secondary | ICD-10-CM | POA: Insufficient documentation

## 2019-02-05 DIAGNOSIS — K50919 Crohn's disease, unspecified, with unspecified complications: Secondary | ICD-10-CM

## 2019-02-05 LAB — POCT I-STAT CREATININE: Creatinine, Ser: 0.8 mg/dL (ref 0.44–1.00)

## 2019-02-05 MED ORDER — GADOBUTROL 1 MMOL/ML IV SOLN
7.0000 mL | Freq: Once | INTRAVENOUS | Status: AC | PRN
  Administered 2019-02-05: 7 mL via INTRAVENOUS

## 2019-02-07 ENCOUNTER — Telehealth: Payer: Self-pay | Admitting: Gastroenterology

## 2019-02-07 ENCOUNTER — Other Ambulatory Visit: Payer: Self-pay | Admitting: Gastroenterology

## 2019-02-07 DIAGNOSIS — K50919 Crohn's disease, unspecified, with unspecified complications: Secondary | ICD-10-CM

## 2019-02-07 MED ORDER — PREDNISONE 10 MG PO TABS: 20.0000 mg | ORAL_TABLET | Freq: Every day | ORAL | 0 refills | Status: DC

## 2019-02-07 NOTE — Telephone Encounter (Signed)
Patient called said that Dr. Havery Moros called her yesterday to discuss her MRI results. She said that she will be ava today to tomorrow to know her results.

## 2019-02-13 ENCOUNTER — Other Ambulatory Visit: Payer: Self-pay | Admitting: Adult Health

## 2019-02-13 MED ORDER — BUPROPION HCL ER (XL) 300 MG PO TB24: 300.0000 mg | ORAL_TABLET | Freq: Every day | ORAL | 0 refills | Status: DC

## 2019-02-13 NOTE — Telephone Encounter (Signed)
Patient called requested refill on :   buPROPion (WELLBUTRIN XL) 300 MG 24 hr tablet [[id number]]   Order Details Dose, Route, Frequency: As Directed  Dispense Quantity: -- Refills: -- Fills remaining: --        Sig: TAKE 1 TABLET BY MOUTH EVERY DAY          ---Forwarding request to med asst to send refill order to :    [place], [place] - [place] [phone] (Phone) [phone] (Fax)   --glh

## 2019-02-26 ENCOUNTER — Other Ambulatory Visit: Payer: Self-pay | Admitting: Gastroenterology

## 2019-02-26 DIAGNOSIS — K50919 Crohn's disease, unspecified, with unspecified complications: Secondary | ICD-10-CM

## 2019-02-28 ENCOUNTER — Telehealth (AMBULATORY_SURGERY_CENTER): Payer: 59 | Admitting: Gastroenterology

## 2019-02-28 ENCOUNTER — Other Ambulatory Visit: Payer: Self-pay

## 2019-02-28 DIAGNOSIS — K219 Gastro-esophageal reflux disease without esophagitis: Secondary | ICD-10-CM

## 2019-02-28 DIAGNOSIS — K50019 Crohn's disease of small intestine with unspecified complications: Secondary | ICD-10-CM

## 2019-02-28 MED ORDER — PANTOPRAZOLE SODIUM 40 MG PO TBEC: 40.0000 mg | DELAYED_RELEASE_TABLET | Freq: Two times a day (BID) | ORAL | 3 refills | Status: DC

## 2019-02-28 NOTE — Telephone Encounter (Signed)
Got a message from the patient about her case and options. She has small bowel Crohn's, recently elevated fecal calprotectin with ileal and jejunal inflammation, although mild. I am recommending Humira to treat this and prevent complications, she has a lot of hesitation about biologic therapy and wants to avoid therapy right now. As long as that is the case and she feels well, we can monitor for now, perhaps give budesonide or prednisone PRN if she needs it in the future if her disease remains mild. She understands my concern about potential for disease progression / strictuing without therapy, and potential for obstruction and worsening symptoms. She will be monitored with labs and clinic visits periodically moving forward, but declines therapy for Crohn's right now.   Otherwise she has persistent reflux despite omeprazole 68m BID. She endorses regurgitation and [facility]phagia [facility] which is reasonable. Will switch and try her on protonix 455mBID in the interim.  Total time spent 12 minutes phone  EsSherlynn Stallsan you please refer to [facility] for paraesophageal hernia, and place a prescription for protonix 401mwice daily. thanks

## 2019-02-28 NOTE — Telephone Encounter (Signed)
Called patient and she is good with referral to [facility]. Sent order for Protonix to patient's pharmacy and patient given instructions for taking. Referral made to [facility]

## 2019-03-08 ENCOUNTER — Telehealth: Payer: Self-pay

## 2019-03-08 NOTE — Telephone Encounter (Signed)
Called [facility] and spoke to Judson Roch, states she left patient a message today about her appt.

## 2019-03-14 ENCOUNTER — Ambulatory Visit: Payer: Self-pay | Admitting: Surgery

## 2019-03-14 NOTE — H&P (Signed)
Surgical H&P  CC: heartburn, paraesophageal hernia  HPI: 63yo woman referred by Dr. Havery Moros for symptomatic paraesophageal hernia. She states that she has been symptomatic from this for about 2 years. She describes heartburn, postprandial discomfort in her chest, as well as regurgitation into her throat. She has been treated with omeprazole, which was then doubled, and then changed to pantoprazole but has had no improvement in her symptoms despite medical therapy.  She also has a somewhat complex history of chronic abdominal pain, mostly in the lower abdomen and right side. She has been seeing Dr. Havery Moros for this, see his note from May in [place]. Also has diagnosis of ileitis and essentially Crohn's disease, based on recent MR enterography. She had a pretty extensive workup and management at [facility] and she has been treated with budesonide in the past. Currently not on any medications by patient's choice, though it was recommended that she try Humira to treat this and prevent complications. She reports occasional constipation. Denies any melena or hematochezia.  Prior workup outlined from Dr. Doyne Keel notes: "Previously followed by Dr. Darlin Coco, [facility]. Negative stool studies for infection CBC normal, LFTs normal, TSH normal ESR 16, CRP 0.53, fecal calprotectin 183 (H)  Barium swallow - reflux noted, paraesophageal hernia, mild dysmotility Esophageal manometry done for dysphagia 04/05/2018 - normal outside of [facility] Colonoscopy 03/30/2018 - one small adenoma, ileum with multiple non-bleeding erosions, multiple small mouthed diverticuli in sigmoid colon, 85m adenoma sigmoid colon - path shows "active ileitis with ulceration" - no evidence of chronicity or granulomas, nonspecific. Normal biopsies of the colon. CT enterography 03/06/2018 - moderate paraesophageal hernia, fatty infiltration of the TI, normal small bowel and colon otherwise CT scan 01/03/18 - fluid filled but nonenlarged  ileum, mild terminal ileitis, paraesophageal hiatal hernia CT scan 01/01/18 - normal bowel, fatty liver, fatty infiltration of TI, paraesophageal hernia EGD 10/09/17 - normal duodenal biopsies, normal gastric biopsies, negative for HP, esophageal biopsies with esophagitis and hyperkeratosis [place] 09/01/2017 - mild increase echogenicity of the wall of gallbladder wall, otherwise normal Colonoscopy 10/09/17 - normal TI, mild diverticulosis, 413madenoma sigmoid colon HIDA scan 09/22/2017 - normal, EF 69.4% "  MRE 02/05/19 - 1. Relatively subtle accentuated enhancement in the terminal ileum, and in a separate transverse nondilated loop of small bowel in the central abdomen. The appearance favors low-grade active Crohn's disease. 2. Periampullary and transverse duodenal diverticula. 3. Moderate-sized type 3 hiatal hernia  She also reports a history of neuropathy which is managed with gabapentin effectively. She also takes Prozac for depression.   She denies tobacco abuse. 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 She has gained a little bit of weight recently.  Allergies  Allergen Reactions  . Etodolac Other (See Comments)    Sleepiness, feeling "out of it" Sleepiness, feeling "out of it"   . Naproxen   . Tizanidine Other (See Comments)  . Zanaflex [Tizanidine Hcl]     Past Medical History:  Diagnosis Date  . Depression .  . Marland Kitcheniatal hernia   . Ileitis     Past Surgical History:  Procedure Laterality Date  . ABDOMINAL HYSTERECTOMY    . BREAST EXCISIONAL BIOPSY Right   . BREAST SURGERY     Tumor Removal  . CESAREAN SECTION    . KNEE SURGERY    . TONSILLECTOMY      Family History  Problem Relation Age of Onset  . Alcohol abuse Mother   . Stroke Mother   .  Diabetes Father   . Depression Paternal Grandmother   . Colon cancer Neg Hx   . Esophageal cancer Neg Hx     Social History   Socioeconomic History  . Marital status:  Married    Spouse name: Not on file  . Number of children: Not on file  . Years of education: Not on file  . Highest education level: Not on file  Occupational History  . Not on file  Social Needs  . Financial resource strain: Not on file  . Food insecurity    Worry: Not on file    Inability: Not on file  . Transportation needs    Medical: Not on file    Non-medical: Not on file  Tobacco Use  . Smoking status: Former Smoker    Years: 5.00    Types: Cigarettes  . Smokeless tobacco: Never Used  Substance and Sexual Activity  . Alcohol use: Yes    Alcohol/week: 9.0 standard drinks    Types: 9 Glasses of wine per week  . Drug use: Never  . Sexual activity: Not Currently  Lifestyle  . Physical activity    Days per week: Not on file    Minutes per session: Not on file  . Stress: Not on file  Relationships  . Social Herbalist on phone: Not on file    Gets together: Not on file    Attends religious service: Not on file    Active member of club or organization: Not on file    Attends meetings of clubs or organizations: Not on file    Relationship status: Not on file  Other Topics Concern  . Not on file  Social History Narrative  . Not on file    Current Outpatient Medications on File Prior to Visit  Medication Sig Dispense Refill  . atorvastatin (LIPITOR) 10 MG tablet Take 1 tablet (10 mg total) by mouth daily at 6 PM. 90 tablet 1  . buPROPion (WELLBUTRIN XL) 300 MG 24 hr tablet Take 1 tablet (300 mg total) by mouth daily. 90 tablet 0  . butalbital-acetaminophen-caffeine (FIORICET) 50-325-40 MG tablet Take 1 tablet by mouth every 6 (six) hours as needed. 14 tablet 0  . cyanocobalamin 2000 MCG tablet Take 2,000 mcg by mouth daily.    . cyclobenzaprine (FLEXERIL) 10 MG tablet Take 1 tablet (10 mg total) by mouth at bedtime. 30 tablet 3  . estradiol (VIVELLE-DOT) 0.1 MG/24HR patch Place onto the skin.    Marland Kitchen FLUoxetine (PROZAC) 40 MG capsule TAKE 1 CAPSULE BY MOUTH  EVERY DAY 90 capsule 0  . gabapentin (NEURONTIN) 600 MG tablet Take 600 mg by mouth daily.    Marland Kitchen omeprazole (PRILOSEC) 40 MG capsule Take 1 capsule (40 mg total) by mouth 2 (two) times a day. 180 capsule 1  . oxybutynin (DITROPAN-XL) 5 MG 24 hr tablet Take 1 tablet (5 mg total) by mouth at bedtime. 90 tablet 0  . pantoprazole (PROTONIX) 40 MG tablet Take 1 tablet (40 mg total) by mouth 2 (two) times daily. 60 tablet 3  . predniSONE (DELTASONE) 10 MG tablet Take 2 tablets (20 mg total) by mouth daily with breakfast. 2 tablets per day for 14 days, then taper by 56m / week until done 58 tablet 0   No current facility-administered medications on file prior to visit.     Review of Systems: a complete, 10pt review of systems was completed with pertinent positives and negatives as documented in the HPI  Physical Exam: 03/14/2019 9:49 AM Weight: 153.4 lb Height: 64in Body Surface Area: 1.75 m Body Mass Index: 26.33 kg/m  Temp.: 98.78F  Pulse: 74 (Regular)  BP: 126/74 (Sitting, Left Arm, Standard) Gen: A&Ox3, no distress  Head: normocephalic, atraumatic Eyes: extraocular motions intact, anicteric.  Neck: supple without mass or thyromegaly Chest: unlabored respirations, symmetrical air entry, clear bilaterally   Cardiovascular: RRR with palpable distal pulses, no pedal edema Abdomen: soft, nondistended, nontender. No mass or organomegaly.  Extremities: warm, without edema, no deformities  Neuro: grossly intact Psych: appropriate mood and affect, normal insight  Skin: warm and dry   CBC Latest Ref Rng & Units 10/11/2018 01/03/2018 01/01/2018  WBC 3.4 - 10.8 x10E3/uL 7.1 10.3 7.1  Hemoglobin 11.1 - 15.9 g/dL 13.1 12.5 13.0  Hematocrit 34.0 - 46.6 % 37.9 36.3 36.8  Platelets 150 - 450 x10E3/uL 331 319 327    CMP Latest Ref Rng & Units 02/05/2019 10/11/2018 01/03/2018  Glucose 65 - 99 mg/dL - 88 142(H)  BUN 8 - 27 mg/dL - 12 11  Creatinine 0.44 - 1.00 mg/dL 0.80 0.94 0.72  Sodium  134 - 144 mmol/L - 140 132(L)  Potassium 3.5 - 5.2 mmol/L - 4.5 4.0  Chloride 96 - 106 mmol/L - 100 101  CO2 20 - 29 mmol/L - 23 21(L)  Calcium 8.7 - 10.3 mg/dL - 9.7 9.1  Total Protein 6.0 - 8.5 g/dL - 7.4 7.7  Total Bilirubin 0.0 - 1.2 mg/dL - 0.3 0.5  Alkaline Phos 39 - 117 IU/L - 107 93  AST 0 - 40 IU/L - 23 34  ALT 0 - 32 IU/L - 23 23    No results found for: INR, PROTIME  Imaging: No results found.    A/P: PARAESOPHAGEAL HERNIA (K44.9) Story: Symptomatic with reflux/ heartburn, regurgitation, postprandial chest discomfort and upper abdominal pain. This is despite high-dose PPI. Discussed option of laparoscopic paraesophageal hernia repair with Nissen fundoplication. Using diagram to demonstrate the anatomy, the technique of the operation was discussed. We discussed the risks of bleeding, infection, pain, scarring, injury to intra-abdominal or mediastinal structures, postoperative dysphagia, gas bloat, wrap herniation, wrap dehiscence, or slipped Nissen, issues with esophageal dysmotility, hernia recurrence. Also discussed risks of general anesthesia including cardiopulmonary or vascular complications including DVT etc. discussed typical perioperative course including. Diet for 2 weeks and slowly advancing to soft and then regular food. I discussed with her that her chronic abdominal pain is not going to resolve with this operation, the Cherlynn June operation is to improve her reflux symptoms. Questions were answered. She wishes to proceed with surgery.   CROHN'S DISEASE (K50.90)   CHRONIC ABDOMINAL PAIN (R10.9)   Romana Juniper, MD [facility], [place] Pager [phone]

## 2019-03-17 ENCOUNTER — Other Ambulatory Visit: Payer: Self-pay | Admitting: Adult Health

## 2019-03-18 NOTE — Telephone Encounter (Signed)
Please call pt to schedule f/u visit that she is due for in August, 2020.  Charyl Bigger, CMA

## 2019-04-03 ENCOUNTER — Other Ambulatory Visit: Payer: Self-pay | Admitting: Adult Health

## 2019-04-15 NOTE — Progress Notes (Signed)
Subjective:    Patient ID: Hailey Galvan, female    DOB: 12/13/1955, 63 y.o.   MRN: [id number]  HPI:  09/06/2018 OV:  Hailey Galvan is here to establish as a new pt. She is a pleasant 63 year old female. PMH: HLD, Depression/Anxiety, Hernia, Ileitis, Chronic pain (cellulitis of LLE that caused permanent nerve damage) She stable mood on Bupropion 300mg  QD, Fluoxetine 40mg  QD- not currently working with therapist HLD- taking atorvastatin 10mg - tolerating well Chronic pain managed by Gabapentin, last contact with Pain Mgt >2.5 years ago Requests referral to GI to address hernia/ileus Estimates to walk 1-2 miles/day M-F when at work Smokes 1-2 cigarettes/day, declines smoking cessation Estimates to drink >60 oz water/day, follows heart healthy diet  10/18/2018 OV: Hailey Galvan is here for CPE She continues to walk >2.5 miles/day M-F when working She states "I drink water all day long" and tries to follow a heart healthy diet. She reports "a painful lump on my right breast" that she detected a few weeks ago.  04/16/2019 OV:  Hailey Galvan is here for 6 month f/u: She was recently dx'd with Crohn's Disease- followed by GI/Dr. Havery Moros  Needs Lipid Panel- she is not fasting today- will schedule lab appt Currently on Atorvastatin 10mg   She requests refill on Gabapentin for LLE nerve pain associated with previous cellulitis years ago. Will not refill since cellulitis is not an active problem and with her hx of opiate use.  Reports table mood on Bupropion 300mg  QD and Fluoxetine 40mg  QD, denies SI/HI She has been in this combination for years She is agreeable to BH/ Psychology referral today  10/29/2018 RECOMMENDATION: Bilateral screening mammogram in 1 year. The patient was instructed to return sooner if the area that she feels becomes larger and firmer to palpation.  I have discussed the findings and recommendations with the patient. Results were also provided in writing at the conclusion  of the visit. If applicable, a reminder letter will be sent to the patient regarding the next appointment.  BI-RADS CATEGORY  1: Negative.  Patient Care Team    Relationship Specialty Notifications Start End  Mina Marble D, NP PCP - General Family Medicine  09/06/18   Deno Etienne, MD Consulting Physician Anesthesiology  09/06/18   Peri Maris, MD Referring Physician Student  09/06/18     Patient Active Problem List   Diagnosis Date Noted  . Crohn's disease ([place]) 04/16/2019  . Breast mass, right 10/18/2018  . Healthcare maintenance 09/06/2018  . Ileitis 09/06/2018  . Recurrent epistaxis 01/18/2018  . Carpal tunnel syndrome on right 08/06/2015  . Pain in both hands 08/03/2015  . Pseudogout of foot, left 08/04/2014  . Right wrist pain 11/19/2013  . Urge incontinence 05/30/2013  . Chronic, continuous use of opioids 12/05/2012  . Neuralgia and neuritis, unspecified 12/05/2012  . S/P arthroscopic knee surgery 10/01/2012  . Tendonitis of elbow, right 06/20/2012  . Pain in left knee 12/08/2011  . Hyperlipidemia 11/15/2011     Past Medical History:  Diagnosis Date  . Crohn disease ([place])   . Depression .  Marland Kitchen Hiatal hernia   . Ileitis      Past Surgical History:  Procedure Laterality Date  . ABDOMINAL HYSTERECTOMY    . BREAST EXCISIONAL BIOPSY Right   . BREAST SURGERY     Tumor Removal  . CESAREAN SECTION    . KNEE SURGERY    . TONSILLECTOMY       Family History  Problem Relation Age of Onset  .  Alcohol abuse Mother   . Stroke Mother   . Diabetes Father   . Depression Paternal Grandmother   . Colon cancer Neg Hx   . Esophageal cancer Neg Hx      Social History   Substance and Sexual Activity  Drug Use Never     Social History   Substance and Sexual Activity  Alcohol Use Yes  . Alcohol/week: 9.0 standard drinks  . Types: 9 Glasses of wine per week     Social History   Tobacco Use  Smoking Status Former Smoker  . Years: 5.00  . Types:  Cigarettes  Smokeless Tobacco Never Used     Outpatient Encounter Medications as of 04/16/2019  Medication Sig  . atorvastatin (LIPITOR) 10 MG tablet Take 1 tablet (10 mg total) by mouth daily at 6 PM.  . buPROPion (WELLBUTRIN XL) 300 MG 24 hr tablet Take 1 tablet (300 mg total) by mouth daily.  . butalbital-acetaminophen-caffeine (FIORICET) 50-325-40 MG tablet TAKE 1 TABLET BY MOUTH EVERY 6 HOURS AS NEEDED  . cyanocobalamin 2000 MCG tablet Take 2,000 mcg by mouth daily.  . cyclobenzaprine (FLEXERIL) 10 MG tablet Take 1 tablet (10 mg total) by mouth at bedtime.  Marland Kitchen FLUoxetine (PROZAC) 40 MG capsule TAKE 1 CAPSULE BY MOUTH EVERY DAY  . gabapentin (NEURONTIN) 600 MG tablet Take 600 mg by mouth daily.  Marland Kitchen omeprazole (PRILOSEC) 40 MG capsule Take 1 capsule (40 mg total) by mouth 2 (two) times a day.  . oxybutynin (DITROPAN-XL) 5 MG 24 hr tablet TAKE 1 TABLET BY MOUTH AT BEDTIME  . pantoprazole (PROTONIX) 40 MG tablet Take 1 tablet (40 mg total) by mouth 2 (two) times daily.  . [DISCONTINUED] estradiol (VIVELLE-DOT) 0.1 MG/24HR patch Place onto the skin.  . [DISCONTINUED] predniSONE (DELTASONE) 10 MG tablet Take 2 tablets (20 mg total) by mouth daily with breakfast. 2 tablets per day for 14 days, then taper by 5mg  / week until done   No facility-administered encounter medications on file as of 04/16/2019.     Allergies: Etodolac, Naproxen, Tizanidine, and Zanaflex [tizanidine hcl]  Body mass index is 26.4 kg/m.  Blood pressure 116/75, pulse 82, temperature 98.4 F (36.9 C), temperature source Oral, height 5\' 4"  (1.626 m), weight 153 lb 12.8 oz (69.8 kg), SpO2 96 %.     Review of Systems  Constitutional: Positive for fatigue. Negative for activity change, appetite change, chills, diaphoresis, fever and unexpected weight change.  Eyes: Negative for visual disturbance.  Respiratory: Negative for cough, chest tightness, shortness of breath, wheezing and stridor.   Cardiovascular: Negative  for chest pain, palpitations and leg swelling.  Gastrointestinal: Positive for abdominal distention and abdominal pain. Negative for blood in stool, constipation, diarrhea, nausea and vomiting.  Endocrine: Negative for cold intolerance, heat intolerance, polydipsia, polyphagia and polyuria.  Neurological: Negative for dizziness and headaches.  Hematological: Negative for adenopathy. Does not bruise/bleed easily.  Psychiatric/Behavioral: Negative for agitation, behavioral problems, confusion, decreased concentration, dysphoric mood, hallucinations, self-injury, sleep disturbance and suicidal ideas. The patient is not nervous/anxious and is not hyperactive.        Objective:   Physical Exam Vitals signs and nursing note reviewed.  Constitutional:      General: She is not in acute distress.    Appearance: Normal appearance. She is normal weight. She is not ill-appearing, toxic-appearing or diaphoretic.  HENT:     Head: Normocephalic and atraumatic.  Cardiovascular:     Rate and Rhythm: Normal rate and regular rhythm.  Pulses: Normal pulses.     Heart sounds: Normal heart sounds. No murmur. No friction rub. No gallop.   Pulmonary:     Effort: Pulmonary effort is normal. No respiratory distress.     Breath sounds: Normal breath sounds. No stridor. No wheezing, rhonchi or rales.  Chest:     Chest wall: No tenderness.  Skin:    General: Skin is warm and dry.     Capillary Refill: Capillary refill takes less than 2 seconds.  Neurological:     Mental Status: She is alert and oriented to person, place, and time.  Psychiatric:        Mood and Affect: Mood normal.        Thought Content: Thought content normal.        Judgment: Judgment normal.        Assessment & Plan:   1. Anxiety and depression   2. Healthcare maintenance   3. Hyperlipidemia, unspecified hyperlipidemia type   4. Ileitis   5. Crohn's disease with complication, unspecified gastrointestinal tract location [facility])      Healthcare maintenance Continue all medications as directed. Continue follow-up with GI/Dr. Havery Moros. Recommend establishing with new surgeon for hiatal hernia surgery ASAP. Remain well hydrated, follow Mediterranean Diet. Continue daily walking. Pleas schedule fasting lab appt in the next week. Please schedule complete physical in 6 months, fasting labs the week prior. Continue to social distance and wear a mask when in public.  Hyperlipidemia Needs Lipid Panel- she is not fasting today- will schedule lab appt Currently on Atorvastatin 10mg   Ileitis Followed by GI/Dr. Havery Moros  Crohn's disease [facility]) She was recently dx'd with Chron's Disease- followed by GI/Dr. Havery Moros    FOLLOW-UP:  Return in about 6 months (around 10/17/2019) for Fasting Labs, CPE.

## 2019-04-16 ENCOUNTER — Ambulatory Visit (INDEPENDENT_AMBULATORY_CARE_PROVIDER_SITE_OTHER): Payer: 59 | Admitting: Adult Health

## 2019-04-16 ENCOUNTER — Other Ambulatory Visit: Payer: Self-pay

## 2019-04-16 ENCOUNTER — Encounter: Payer: Self-pay | Admitting: Adult Health

## 2019-04-16 VITALS — BP 116/75 | HR 82 | Temp 98.4°F | Ht 64.0 in | Wt 153.8 lb

## 2019-04-16 DIAGNOSIS — F329 Major depressive disorder, single episode, unspecified: Secondary | ICD-10-CM

## 2019-04-16 DIAGNOSIS — Z0000 Encounter for general adult medical examination without abnormal findings: Secondary | ICD-10-CM | POA: Diagnosis not present

## 2019-04-16 DIAGNOSIS — K50919 Crohn's disease, unspecified, with unspecified complications: Secondary | ICD-10-CM

## 2019-04-16 DIAGNOSIS — K529 Noninfective gastroenteritis and colitis, unspecified: Secondary | ICD-10-CM

## 2019-04-16 DIAGNOSIS — F419 Anxiety disorder, unspecified: Secondary | ICD-10-CM | POA: Diagnosis not present

## 2019-04-16 DIAGNOSIS — E785 Hyperlipidemia, unspecified: Secondary | ICD-10-CM

## 2019-04-16 DIAGNOSIS — K5090 Crohn's disease, unspecified, without complications: Secondary | ICD-10-CM | POA: Insufficient documentation

## 2019-04-16 NOTE — Patient Instructions (Signed)
Mediterranean Diet A Mediterranean diet refers to food and lifestyle choices that are based on the traditions of countries located on the [employer]. This way of eating has been shown to help prevent certain conditions and improve outcomes for people who have chronic diseases, like kidney disease and heart disease. What are tips for following this plan? Lifestyle  Cook and eat meals together with your family, when possible.  Drink enough fluid to keep your urine clear or pale yellow.  Be physically active every day. This includes: ? Aerobic exercise like running or swimming. ? Leisure activities like gardening, walking, or housework.  Get 7-8 hours of sleep each night.  If recommended by your health care provider, drink red wine in moderation. This means 1 glass a day for nonpregnant women and 2 glasses a day for men. A glass of wine equals 5 oz (150 mL). Reading food labels   Check the serving size of packaged foods. For foods such as rice and pasta, the serving size refers to the amount of cooked product, not dry.  Check the total fat in packaged foods. Avoid foods that have saturated fat or trans fats.  Check the ingredients list for added sugars, such as corn syrup. Shopping  At the grocery store, buy most of your food from the areas near the walls of the store. This includes: ? Fresh fruits and vegetables (produce). ? Grains, beans, nuts, and seeds. Some of these may be available in unpackaged forms or large amounts (in bulk). ? Fresh seafood. ? Poultry and eggs. ? Low-fat dairy products.  Buy whole ingredients instead of prepackaged foods.  Buy fresh fruits and vegetables in-season from local farmers markets.  Buy frozen fruits and vegetables in resealable bags.  If you do not have access to quality fresh seafood, buy precooked frozen shrimp or canned fish, such as tuna, salmon, or sardines.  Buy small amounts of raw or cooked vegetables, salads, or olives from  the deli or salad bar at your store.  Stock your pantry so you always have certain foods on hand, such as olive oil, canned tuna, canned tomatoes, rice, pasta, and beans. Cooking  Cook foods with extra-virgin olive oil instead of using butter or other vegetable oils.  Have meat as a side dish, and have vegetables or grains as your main dish. This means having meat in small portions or adding small amounts of meat to foods like pasta or stew.  Use beans or vegetables instead of meat in common dishes like chili or lasagna.  Experiment with different cooking methods. Try roasting or broiling vegetables instead of steaming or sauteing them.  Add frozen vegetables to soups, stews, pasta, or rice.  Add nuts or seeds for added healthy fat at each meal. You can add these to yogurt, salads, or vegetable dishes.  Marinate fish or vegetables using olive oil, lemon juice, garlic, and fresh herbs. Meal planning   Plan to eat 1 vegetarian meal one day each week. Try to work up to 2 vegetarian meals, if possible.  Eat seafood 2 or more times a week.  Have healthy snacks readily available, such as: ? Vegetable sticks with hummus. ? [place] yogurt. ? Fruit and nut trail mix.  Eat balanced meals throughout the week. This includes: ? Fruit: 2-3 servings a day ? Vegetables: 4-5 servings a day ? Low-fat dairy: 2 servings a day ? Fish, poultry, or lean meat: 1 serving a day ? Beans and legumes: 2 or more servings a week ?  Nuts and seeds: 1-2 servings a day ? Whole grains: 6-8 servings a day ? Extra-virgin olive oil: 3-4 servings a day  Limit red meat and sweets to only a few servings a month What are my food choices?  Mediterranean diet ? Recommended  Grains: Whole-grain pasta. Brown rice. Bulgar wheat. Polenta. Couscous. Whole-wheat bread. [place].  Vegetables: Artichokes. Beets. Broccoli. Cabbage. Carrots. Eggplant. Green beans. Chard. Kale. Spinach. Onions. Leeks. Peas. Squash.  Tomatoes. Peppers. Radishes.  Fruits: Apples. Apricots. Avocado. Berries. Bananas. Cherries. Dates. Figs. Grapes. Lemons. Melon. Oranges. Peaches. Plums. Pomegranate.  Meats and other protein foods: Beans. Almonds. Sunflower seeds. Pine nuts. Peanuts. [place]. Salmon. Scallops. Shrimp. [place]. Tilapia. Clams. Oysters. Eggs.  Dairy: Low-fat milk. Cheese. Greek yogurt.  Beverages: Water. Red wine. Herbal tea.  Fats and oils: Extra virgin olive oil. Avocado oil. Grape seed oil.  Sweets and desserts: [place] yogurt with honey. Baked apples. Poached pears. Trail mix.  Seasoning and other foods: Basil. Cilantro. Coriander. Cumin. Mint. Parsley. Sage. Rosemary. Tarragon. Garlic. Oregano. Thyme. Pepper. Balsalmic vinegar. Tahini. Hummus. Tomato sauce. Olives. Mushrooms. ? Limit these  Grains: Prepackaged pasta or rice dishes. Prepackaged cereal with added sugar.  Vegetables: Deep fried potatoes (french fries).  Fruits: Fruit canned in syrup.  Meats and other protein foods: Beef. Pork. Lamb. Poultry with skin. Hot dogs. [place] Salines.  Dairy: Ice cream. Sour cream. Whole milk.  Beverages: Juice. Sugar-sweetened soft drinks. Beer. Liquor and spirits.  Fats and oils: Butter. Canola oil. Vegetable oil. Beef fat (tallow). Lard.  Sweets and desserts: Cookies. Cakes. Pies. Candy.  Seasoning and other foods: Mayonnaise. Premade sauces and marinades. The items listed may not be a complete list. Talk with your dietitian about what dietary choices are right for you. Summary  The Mediterranean diet includes both food and lifestyle choices.  Eat a variety of fresh fruits and vegetables, beans, nuts, seeds, and whole grains.  Limit the amount of red meat and sweets that you eat.  Talk with your health care provider about whether it is safe for you to drink red wine in moderation. This means 1 glass a day for nonpregnant women and 2 glasses a day for men. A glass of wine equals 5 oz (150 mL). This information  is not intended to replace advice given to you by your health care provider. Make sure you discuss any questions you have with your health care provider. Document Released: 03/31/2016 Document Revised: 04/07/2016 Document Reviewed: 03/31/2016 Elsevier Patient Education  2020 [place] all medications as directed. Continue follow-up with GI/Dr. Havery Moros. Recommend establishing with new surgeon for hiatal hernia surgery ASAP. Remain well hydrated, follow Mediterranean Diet. Continue daily walking. Pleas schedule fasting lab appt in the next week. Please schedule complete physical in 6 months, fasting labs the week prior. Continue to social distance and wear a mask when in public. GREAT TO SEE YOU!

## 2019-04-16 NOTE — Assessment & Plan Note (Signed)
Continue all medications as directed. Continue follow-up with GI/Dr. Havery Moros. Recommend establishing with new surgeon for hiatal hernia surgery ASAP. Remain well hydrated, follow Mediterranean Diet. Continue daily walking. Pleas schedule fasting lab appt in the next week. Please schedule complete physical in 6 months, fasting labs the week prior. Continue to social distance and wear a mask when in public.

## 2019-04-16 NOTE — Assessment & Plan Note (Signed)
Needs Lipid Panel- she is not fasting today- will schedule lab appt Currently on Atorvastatin 10mg 

## 2019-04-16 NOTE — Assessment & Plan Note (Signed)
She was recently dx'd with Chron's Disease- followed by GI/Dr. Havery Moros

## 2019-04-16 NOTE — Assessment & Plan Note (Signed)
Followed by GI/Dr. Havery Moros

## 2019-04-18 ENCOUNTER — Other Ambulatory Visit: Payer: Self-pay

## 2019-04-18 ENCOUNTER — Ambulatory Visit: Payer: 59 | Admitting: Adult Health

## 2019-04-18 MED ORDER — GABAPENTIN 600 MG PO TABS: 600.0000 mg | ORAL_TABLET | Freq: Every day | ORAL | 3 refills | Status: DC

## 2019-04-24 ENCOUNTER — Other Ambulatory Visit: Payer: 59

## 2019-04-24 ENCOUNTER — Other Ambulatory Visit: Payer: Self-pay

## 2019-04-24 ENCOUNTER — Telehealth: Payer: Self-pay | Admitting: Gastroenterology

## 2019-04-24 ENCOUNTER — Other Ambulatory Visit: Payer: Self-pay | Admitting: Adult Health

## 2019-04-24 DIAGNOSIS — Z0000 Encounter for general adult medical examination without abnormal findings: Secondary | ICD-10-CM

## 2019-04-24 DIAGNOSIS — E785 Hyperlipidemia, unspecified: Secondary | ICD-10-CM

## 2019-04-24 MED ORDER — TRAMADOL HCL 50 MG PO TABS: 50.0000 mg | ORAL_TABLET | Freq: Three times a day (TID) | ORAL | 0 refills | Status: DC | PRN

## 2019-04-24 NOTE — Telephone Encounter (Signed)
Jan I think it would be good for her to follow up with me. She has some active Crohn's on her MRI, not sure if that is driving her symptoms versus musculoskeletal source. Has she been on prednisone recently, and if so, has it helped? If not, we could try her on prednisone 56m or 429m/ day for a few weeks and see if that helps.

## 2019-04-24 NOTE — Telephone Encounter (Signed)
Okay if no help with the prednisone I understand. Can refill gabapentin if it helped. Could also try her on Ultram 50mg  q 8 hours PRN. Can give #30 as a trial if she wants to see if that helps.

## 2019-04-24 NOTE — Telephone Encounter (Signed)
Pt needs rf for gabapentin sent to [facility] on [address].

## 2019-04-24 NOTE — Telephone Encounter (Signed)
Spoke to pt. She was agreeable to trial of Ultram.  Sent script to pharmacy.

## 2019-04-24 NOTE — Telephone Encounter (Signed)
Called and spoke to patient.  She is still having "really bad" abdominal pain and needs something to take. The gabapentin 600 mg bid "does not touch the pain at all" but she doesn't know what else to take.  She is taking Flexeril qhs and that helps at night.  If there isn't something else you can recommend then she would like a refill of the gabapentin. Please advise.

## 2019-04-24 NOTE — Telephone Encounter (Signed)
Called and spoke to pt.  She has tried prednisone and it didn't help. She is not interested in doing that again. I scheduled her for an appt on Oct 8th - next available.  She is fine with that appt date.  She would like to try gabapentin again in the meantime.  OK to fill 600mg  bid for 1 month?

## 2019-04-25 ENCOUNTER — Encounter: Payer: Self-pay | Admitting: Adult Health

## 2019-04-25 ENCOUNTER — Telehealth: Payer: Self-pay | Admitting: Gastroenterology

## 2019-04-25 ENCOUNTER — Other Ambulatory Visit: Payer: Self-pay

## 2019-04-25 ENCOUNTER — Other Ambulatory Visit: Payer: Self-pay | Admitting: Adult Health

## 2019-04-25 LAB — CBC WITH DIFFERENTIAL/PLATELET
Basophils Absolute: 0 10*3/uL (ref 0.0–0.2)
Basos: 1 %
EOS (ABSOLUTE): 0.3 10*3/uL (ref 0.0–0.4)
Eos: 5 %
Hematocrit: 40.1 % (ref 34.0–46.6)
Hemoglobin: 13.2 g/dL (ref 11.1–15.9)
Immature Grans (Abs): 0 10*3/uL (ref 0.0–0.1)
Immature Granulocytes: 0 %
Lymphocytes Absolute: 2 10*3/uL (ref 0.7–3.1)
Lymphs: 27 %
MCH: 30.5 pg (ref 26.6–33.0)
MCHC: 32.9 g/dL (ref 31.5–35.7)
MCV: 93 fL (ref 79–97)
Monocytes Absolute: 0.6 10*3/uL (ref 0.1–0.9)
Monocytes: 9 %
Neutrophils Absolute: 4.4 10*3/uL (ref 1.4–7.0)
Neutrophils: 58 %
Platelets: 308 10*3/uL (ref 150–450)
RBC: 4.33 x10E6/uL (ref 3.77–5.28)
RDW: 13 % (ref 11.7–15.4)
WBC: 7.4 10*3/uL (ref 3.4–10.8)

## 2019-04-25 LAB — COMPREHENSIVE METABOLIC PANEL
ALT: 37 IU/L — ABNORMAL HIGH (ref 0–32)
AST: 35 IU/L (ref 0–40)
Albumin/Globulin Ratio: 1.9 (ref 1.2–2.2)
Albumin: 4.5 g/dL (ref 3.8–4.8)
Alkaline Phosphatase: 102 IU/L (ref 39–117)
BUN/Creatinine Ratio: 9 — ABNORMAL LOW (ref 12–28)
BUN: 9 mg/dL (ref 8–27)
Bilirubin Total: 0.3 mg/dL (ref 0.0–1.2)
CO2: 24 mmol/L (ref 20–29)
Calcium: 9.5 mg/dL (ref 8.7–10.3)
Chloride: 102 mmol/L (ref 96–106)
Creatinine, Ser: 0.96 mg/dL (ref 0.57–1.00)
GFR calc Af Amer: 73 mL/min/{1.73_m2} (ref >59)
GFR calc non Af Amer: 63 mL/min/{1.73_m2} (ref >59)
Globulin, Total: 2.4 g/dL (ref 1.5–4.5)
Glucose: 102 mg/dL — ABNORMAL HIGH (ref 65–99)
Potassium: 4.6 mmol/L (ref 3.5–5.2)
Sodium: 139 mmol/L (ref 134–144)
Total Protein: 6.9 g/dL (ref 6.0–8.5)

## 2019-04-25 LAB — LIPID PANEL
Chol/HDL Ratio: 3.2 ratio (ref 0.0–4.4)
Cholesterol, Total: 251 mg/dL — ABNORMAL HIGH (ref 100–199)
HDL: 78 mg/dL (ref >39)
LDL Chol Calc (NIH): 147 mg/dL — ABNORMAL HIGH (ref 0–99)
Triglycerides: 148 mg/dL (ref 0–149)
VLDL Cholesterol Cal: 26 mg/dL (ref 5–40)

## 2019-04-25 LAB — HEMOGLOBIN A1C
Est. average glucose Bld gHb Est-mCnc: 111 mg/dL
Hgb A1c MFr Bld: 5.5 % (ref 4.8–5.6)

## 2019-04-25 LAB — TSH: TSH: 2 u[IU]/mL (ref 0.450–4.500)

## 2019-04-25 MED ORDER — TRAMADOL HCL 50 MG PO TABS: 50.0000 mg | ORAL_TABLET | Freq: Three times a day (TID) | ORAL | 0 refills | Status: DC | PRN

## 2019-04-25 MED ORDER — TRAMADOL HCL 50 MG PO TABS: 50.0000 mg | ORAL_TABLET | Freq: Three times a day (TID) | ORAL | 5 refills | Status: DC | PRN

## 2019-04-25 NOTE — Telephone Encounter (Signed)
Changed script to #5 with 5 refills

## 2019-04-25 NOTE — Telephone Encounter (Signed)
[facility] calling stating that prescription for tramadol can only be for 5 days due to being the first time that it is been prescribed to patient, it can be rf after. They either need a fax with new order or will take a verbal order.

## 2019-05-02 ENCOUNTER — Other Ambulatory Visit: Payer: Self-pay

## 2019-05-02 MED ORDER — TRAMADOL HCL 50 MG PO TABS: 50.0000 mg | ORAL_TABLET | Freq: Three times a day (TID) | ORAL | 0 refills | Status: DC | PRN

## 2019-05-08 ENCOUNTER — Ambulatory Visit: Payer: 59 | Admitting: Psychology

## 2019-05-14 ENCOUNTER — Other Ambulatory Visit: Payer: Self-pay | Admitting: Gastroenterology

## 2019-05-14 ENCOUNTER — Other Ambulatory Visit: Payer: Self-pay | Admitting: Adult Health

## 2019-05-15 ENCOUNTER — Telehealth: Payer: Self-pay

## 2019-05-15 ENCOUNTER — Telehealth: Payer: Self-pay | Admitting: Gastroenterology

## 2019-05-15 ENCOUNTER — Other Ambulatory Visit: Payer: Self-pay

## 2019-05-15 MED ORDER — TRAMADOL HCL 50 MG PO TABS: 50.0000 mg | ORAL_TABLET | Freq: Three times a day (TID) | ORAL | 0 refills | Status: DC | PRN

## 2019-05-15 NOTE — Telephone Encounter (Signed)
[place] and spoke to pharmacist.  Verified Dr. Havery Moros sent script of Tramadol for 1 month for Abdominal pain R10.9.

## 2019-05-15 NOTE — Telephone Encounter (Signed)
na

## 2019-05-15 NOTE — Telephone Encounter (Signed)
See phone note

## 2019-05-15 NOTE — Telephone Encounter (Signed)
Patient called back, let her know order for Tramadol has been faxed to her pharmacy. And Dr. Havery Moros will talk with her more about her other questions at her office visit and that he did review labs from [facility]

## 2019-05-21 ENCOUNTER — Other Ambulatory Visit: Payer: Self-pay

## 2019-05-24 ENCOUNTER — Other Ambulatory Visit (INDEPENDENT_AMBULATORY_CARE_PROVIDER_SITE_OTHER): Payer: 59

## 2019-05-24 ENCOUNTER — Encounter: Payer: Self-pay | Admitting: Gastroenterology

## 2019-05-24 ENCOUNTER — Other Ambulatory Visit: Payer: Self-pay

## 2019-05-24 ENCOUNTER — Ambulatory Visit: Payer: 59 | Admitting: Gastroenterology

## 2019-05-24 VITALS — BP 122/74 | HR 81 | Temp 98.5°F | Ht 64.0 in | Wt 157.1 lb

## 2019-05-24 DIAGNOSIS — R109 Unspecified abdominal pain: Secondary | ICD-10-CM | POA: Diagnosis not present

## 2019-05-24 DIAGNOSIS — K529 Noninfective gastroenteritis and colitis, unspecified: Secondary | ICD-10-CM

## 2019-05-24 LAB — CBC WITH DIFFERENTIAL/PLATELET
Basophils Absolute: 0.1 10*3/uL (ref 0.0–0.1)
Basophils Relative: 1.1 % (ref 0.0–3.0)
Eosinophils Absolute: 0.3 10*3/uL (ref 0.0–0.7)
Eosinophils Relative: 3.8 % (ref 0.0–5.0)
HCT: 37.3 % (ref 36.0–46.0)
Hemoglobin: 12.7 g/dL (ref 12.0–15.0)
Lymphocytes Relative: 26.1 % (ref 12.0–46.0)
Lymphs Abs: 2.3 10*3/uL (ref 0.7–4.0)
MCHC: 33.9 g/dL (ref 30.0–36.0)
MCV: 92.4 fl (ref 78.0–100.0)
Monocytes Absolute: 0.7 10*3/uL (ref 0.1–1.0)
Monocytes Relative: 8.4 % (ref 3.0–12.0)
Neutro Abs: 5.2 10*3/uL (ref 1.4–7.7)
Neutrophils Relative %: 60.6 % (ref 43.0–77.0)
Platelets: 303 10*3/uL (ref 150.0–400.0)
RBC: 4.04 Mil/uL (ref 3.87–5.11)
RDW: 13.4 % (ref 11.5–15.5)
WBC: 8.6 10*3/uL (ref 4.0–10.5)

## 2019-05-24 LAB — COMPREHENSIVE METABOLIC PANEL
ALT: 33 U/L (ref 0–35)
AST: 25 U/L (ref 0–37)
Albumin: 4.5 g/dL (ref 3.5–5.2)
Alkaline Phosphatase: 97 U/L (ref 39–117)
BUN: 11 mg/dL (ref 6–23)
CO2: 25 mEq/L (ref 19–32)
Calcium: 9.6 mg/dL (ref 8.4–10.5)
Chloride: 102 mEq/L (ref 96–112)
Creatinine, Ser: 0.84 mg/dL (ref 0.40–1.20)
GFR: 68.33 mL/min (ref >60.00)
Glucose, Bld: 100 mg/dL — ABNORMAL HIGH (ref 70–99)
Potassium: 3.9 mEq/L (ref 3.5–5.1)
Sodium: 138 mEq/L (ref 135–145)
Total Bilirubin: 0.5 mg/dL (ref 0.2–1.2)
Total Protein: 7.7 g/dL (ref 6.0–8.3)

## 2019-05-24 LAB — C-REACTIVE PROTEIN: CRP: <1 mg/dL (ref 0.5–20.0)

## 2019-05-24 LAB — SEDIMENTATION RATE: Sed Rate: 29 mm/hr (ref 0–30)

## 2019-05-24 MED ORDER — HYOSCYAMINE SULFATE SL 0.125 MG SL SUBL: 1.0000 | SUBLINGUAL_TABLET | Freq: Four times a day (QID) | SUBLINGUAL | 1 refills | Status: DC | PRN

## 2019-05-24 NOTE — Patient Instructions (Signed)
Please go to the lab in the basement of our building to have lab work done as you leave today. Hit "B" for basement when you get on the elevator.  When the doors open the lab is on your left.  We will call you with the results. Thank you.   We have sent the following medications to your pharmacy for you to pick up at your convenience: Levsin 0.125 sublingual: Take every 6 hours as needed  To help prevent the possible spread of infection to our patients, communities, and staff; we will be implementing the following measures:  As of now we are not allowing any visitors/family members to accompany you to any upcoming appointments with [facility]. If you have any concerns about this please contact our office to discuss prior to the appointment.   Thank you for entrusting me with your care and for choosing [facility], Dr. Granger Cellar

## 2019-05-24 NOTE — Progress Notes (Signed)
HPI :  63 y/o female here for a follow up visit. Please see prior intake history for full details of her case.  She is had an extensive work-up for intermittent abdominal pain as outlined below.  The leading thought was that she may have small bowel Crohn's disease.  Placed on budesonide at [facility] for a trial however she ended up taking this for about a year or so with improvement in some right lower quadrant pain however no improvement in rather diffuse discomfort.  The question has been does she have Crohn's disease causing her symptoms or another etiology.  She had a repeat fecal calprotectin in May at a value of 243.  She had a follow-up MR enterography which appeared to show some mildly active Crohn's disease of the small bowel.  She was given a full prednisone taper starting at 40 mg a day for 2 weeks, then down by 5 mg a week until done.  She reports this made no difference in her symptoms at all.  She has continued to struggle with intermittent abdominal pains.  Her pain is rather diffuse encompassing most of her entire abdomen.  She states some days she has pain and some days she does not.  She has been home from work several days due to symptoms.  It comes and goes every few days.  She denies any strong prandial relationship with her symptoms.  She denies any improvement with fasting.  She denies any nausea or vomiting.  She is moving her bowels routinely.  She gained 15 pounds with the prednisone has not been able to lose that.  She denies any diarrhea or changes in her bowel habits.  No blood in her stools.  She has been given some tramadol in the past for pain which helped however she broke out in a rash to that and we stopped it.  He is rather frustrated by her ongoing symptoms and lack of improvement.  We discussed options as outlined.  He denies any NSAIDs. She has been on gabapentin for neuropathy and headaches and no better. She has been on prozac and wellbutrin for history of depression  and she states these work quite well for her for this issue. She has not been on a TCA.  I had previously discussed a trial of anti-TNF for her for suspected small bowel Crohn's disease.  She did not go to the lab for QuantiFERON gold or hepatitis testing in preparation for this.  She has no FH of Crohn's or colitis. No FH of colon cancer. No tobacco.  Prior workup as outlined: Previously followed by Dr. Darlin Coco, [facility].  Negative stool studies for infection CBC normal, LFTs normal, TSH normal ESR 16, CRP 0.53, fecal calprotectin 183 (H)  Barium swallow - reflux noted, paraesophageal hernia, mild dysmotility Esophageal manometry done for dysphagia 04/05/2018 - normal outside of [facility] Colonoscopy 03/30/2018 - one small adenoma, ileum with multiple non-bleeding erosions, multiple small mouthed diverticuli in sigmoid colon, 4m adenoma sigmoid colon - path shows "active ileitis with ulceration" - no evidence of chronicity or granulomas, nonspecific. Normal biopsies of the colon. CT enterography 03/06/2018 - moderate paraesophageal hernia, fatty infiltration of the TI, normal small bowel and colon otherwise CT scan 01/03/18 - fluid filled but nonenlarged ileum, mild terminal ileitis, paraesophageal hiatal hernia CT scan 01/01/18 - normal bowel, fatty liver, fatty infiltration of TI, paraesophageal hernia EGD 10/09/17 - normal duodenal biopsies, normal gastric biopsies, negative for HP, esophageal biopsies with esophagitis and hyperkeratosis  [place] RUQ 09/01/2017 - mild increase echogenicity of the wall of gallbladder wall, otherwise normal Colonoscopy 10/09/17 - normal TI, mild diverticulosis, 57m adenoma sigmoid colon HIDA scan 09/22/2017 - normal, EF 69.4%  Fecal calprotectin 01/18/19 - 243  MRE 02/06/19 - IMPRESSION: 1. Relatively subtle accentuated enhancement in the terminal ileum, and in a separate transverse nondilated loop of small bowel in the central abdomen. The appearance favors low-grade  active Crohn's disease. 2. Periampullary and transverse duodenal diverticula. 3. Moderate-sized type 3 hiatal hernia.    Past Medical History:  Diagnosis Date   Crohn disease ([place]    Depression .   Hiatal hernia    Ileitis      Past Surgical History:  Procedure Laterality Date   ABDOMINAL HYSTERECTOMY     BREAST EXCISIONAL BIOPSY Right    BREAST SURGERY     Tumor Removal   CESAREAN SECTION     KNEE SURGERY     TONSILLECTOMY     Family History  Problem Relation Age of Onset   Alcohol abuse Mother    Stroke Mother    Diabetes Father    Depression Paternal Grandmother    Colon cancer Neg Hx    Esophageal cancer Neg Hx    Social History   Tobacco Use   Smoking status: Former Smoker    Years: 5.00    Types: Cigarettes   Smokeless tobacco: Never Used  Substance Use Topics   Alcohol use: Yes    Alcohol/week: 9.0 standard drinks    Types: 9 Glasses of wine per week   Drug use: Never   Current Outpatient Medications  Medication Sig Dispense Refill   atorvastatin (LIPITOR) 10 MG tablet Take 1 tablet (10 mg total) by mouth daily at 6 PM. 90 tablet 1   buPROPion (WELLBUTRIN XL) 300 MG 24 hr tablet Take 1 tablet by mouth once daily 90 tablet 1   cyanocobalamin 2000 MCG tablet Take 2,000 mcg by mouth daily.     cyclobenzaprine (FLEXERIL) 10 MG tablet Take 1 tablet (10 mg total) by mouth at bedtime. 30 tablet 3   FLUoxetine (PROZAC) 40 MG capsule TAKE 1 CAPSULE BY MOUTH EVERY DAY 90 capsule 0   gabapentin (NEURONTIN) 600 MG tablet Take 1 tablet (600 mg total) by mouth daily. 30 tablet 3   omeprazole (PRILOSEC) 40 MG capsule Take 1 capsule (40 mg total) by mouth 2 (two) times a day. 180 capsule 1   pantoprazole (PROTONIX) 40 MG tablet Take 1 tablet (40 mg total) by mouth 2 (two) times daily. 60 tablet 3   No current facility-administered medications for this visit.    Allergies  Allergen Reactions   Etodolac Other (See Comments)     Sleepiness, feeling "out of it" Sleepiness, feeling "out of it"    Naproxen    Tizanidine Other (See Comments)   Zanaflex [Tizanidine Hcl]      Review of Systems: All systems reviewed and negative except where noted in HPI.   Lab Results  Component Value Date   WBC 7.4 04/24/2019   HGB 13.2 04/24/2019   HCT 40.1 04/24/2019   MCV 93 04/24/2019   PLT 308 04/24/2019    Lab Results  Component Value Date   CREATININE 0.96 04/24/2019   BUN 9 04/24/2019   NA 139 04/24/2019   K 4.6 04/24/2019   CL 102 04/24/2019   CO2 24 04/24/2019    Lab Results  Component Value Date   ALT 37 (H) 04/24/2019   AST  35 04/24/2019   ALKPHOS 102 04/24/2019   BILITOT 0.3 04/24/2019     Physical Exam: BP 122/74 (BP Location: Left Arm, Patient Position: Sitting, Cuff Size: Normal)    Pulse 81    Temp 98.5 F (36.9 C) (Oral)    Ht 5' 4"  (1.626 m)    Wt 157 lb 2 oz (71.3 kg)    BMI 26.97 kg/m  Constitutional: Pleasant,well-developed, female in no acute distress. HEENT: Normocephalic and atraumatic. Conjunctivae are normal. No scleral icterus. Neck supple.  Cardiovascular: Normal rate, regular rhythm.  Pulmonary/chest: Effort normal and breath sounds normal. No wheezing, rales or rhonchi. Abdominal: Soft, nondistended, nontender.  There are no masses palpable. No hepatomegaly. Extremities: no edema Lymphadenopathy: No cervical adenopathy noted. Neurological: Alert and oriented to person place and time. Skin: Skin is warm and dry. No rashes noted. Psychiatric: Normal mood and affect. Behavior is normal.   ASSESSMENT AND PLAN: 63 y/o female here for reassessment of the following issues:  Abdominal pain / ileitis - history as above, extensive workup at [facility]. She had RLQ pain in the setting of active ileitis on colonoscopy, path nonspecific / nondiagnostic with elevated fecal calprotectin. She was given budesonide with resolution of RLQ pain previously. However, now has been having  persistent diffuse abdominal pain which has persisted over months. Recent MRE as above suggested active Crohn's and fecal calprotectin was elevated.  I treated her with prednisone and she reports absolutely no difference in her symptoms which was surprising.  We discussed options moving forward.  Her case is a bit atypical with lack of improvement with steroids if this is inflammatory Crohn's disease.  Based off her work-up thus far that has been the leading diagnosis.  I am going to repeat her blood work today including ESR, CRP and repeat fecal calprotectin.  We may repeat a colonoscopy to try and get more tissue to confirm the diagnosis or consider capsule endoscopy or repeat MR enterography.  Based on that information, if this continues to be consistent with Crohn's disease would try her next on a biologic therapy.  We discussed options and I would recommend Humira in this setting.  I will check QuantiFERON gold and hepatitis testing to ensure negative in case she wants to proceed with this.  If the lab results and work-up come back with conflicting information, we could also consider a surgical evaluation as well.  She has not had any obvious stricturing on MRE or prior CT scan however, this appears mostly inflammatory, thus would expect her to get better with medical therapy.  The other possibility is that her pain is musculoskeletal and not related to these imaging findings, however given her prior fecal calprotectin I do think her bowel may be driving some of these symptoms.  I will contact her with results of lab work once available, will try her on some Levsin to treat cramps in the interim (symptoms are stable and longstanding).  Anticipate colonoscopy versus capsule study in the next 1 to 2 weeks.  She agreed  Sedalia Cellar, MD [facility]

## 2019-05-27 ENCOUNTER — Other Ambulatory Visit: Payer: Self-pay | Admitting: Adult Health

## 2019-05-27 ENCOUNTER — Encounter: Payer: Self-pay | Admitting: Adult Health

## 2019-05-27 ENCOUNTER — Telehealth: Payer: Self-pay

## 2019-05-27 LAB — HEPATITIS B SURFACE ANTIGEN: Hepatitis B Surface Ag: NONREACTIVE

## 2019-05-27 LAB — HEPATITIS C ANTIBODY
Hepatitis C Ab: NONREACTIVE
SIGNAL TO CUT-OFF: 0.01 (ref <1.00)

## 2019-05-27 LAB — QUANTIFERON-TB GOLD PLUS
Mitogen-NIL: >10 IU/mL
NIL: 0.06 IU/mL
QuantiFERON-TB Gold Plus: NEGATIVE
TB1-NIL: 0.05 IU/mL
TB2-NIL: 0.04 IU/mL

## 2019-05-27 LAB — HEPATITIS B CORE ANTIBODY, TOTAL: Hep B Core Total Ab: NONREACTIVE

## 2019-05-27 NOTE — Telephone Encounter (Signed)
Pt returned your call.  

## 2019-05-27 NOTE — Telephone Encounter (Signed)
Called patient  And left message to please call back to get more information on her My Chart message. Wanted to R/O heart related symptoms

## 2019-05-28 ENCOUNTER — Telehealth: Payer: Self-pay

## 2019-05-28 ENCOUNTER — Ambulatory Visit (INDEPENDENT_AMBULATORY_CARE_PROVIDER_SITE_OTHER)
Admission: RE | Admit: 2019-05-28 | Discharge: 2019-05-28 | Disposition: A | Discharge status: Home / Self Care | Payer: 59 | Source: Ambulatory Visit | Attending: Gastroenterology | Admitting: Gastroenterology

## 2019-05-28 ENCOUNTER — Other Ambulatory Visit: Payer: Self-pay

## 2019-05-28 DIAGNOSIS — R0789 Other chest pain: Secondary | ICD-10-CM

## 2019-05-28 MED ORDER — BUTALBITAL-APAP-CAFFEINE 50-325-40 MG PO TABS: 1.0000 | ORAL_TABLET | Freq: Four times a day (QID) | ORAL | 0 refills | Status: DC | PRN

## 2019-05-28 NOTE — Telephone Encounter (Signed)
See patient advise note

## 2019-05-28 NOTE — Telephone Encounter (Signed)
Order CXR (Pa & Lat) at [facility] X-ray Dept. Called patient and let her know, no appt. Needed, hours Mon-Fri.  8:30am-4:00pm

## 2019-05-30 ENCOUNTER — Other Ambulatory Visit: Payer: 59

## 2019-05-30 ENCOUNTER — Ambulatory Visit: Payer: 59 | Admitting: Gastroenterology

## 2019-05-30 DIAGNOSIS — R109 Unspecified abdominal pain: Secondary | ICD-10-CM

## 2019-05-30 DIAGNOSIS — K529 Noninfective gastroenteritis and colitis, unspecified: Secondary | ICD-10-CM

## 2019-05-31 ENCOUNTER — Other Ambulatory Visit: Payer: Self-pay

## 2019-05-31 MED ORDER — FLUOXETINE HCL 40 MG PO CAPS: ORAL_CAPSULE | ORAL | 0 refills | Status: DC

## 2019-06-03 ENCOUNTER — Other Ambulatory Visit: Payer: Self-pay | Admitting: Adult Health

## 2019-06-06 LAB — CALPROTECTIN, FECAL: Calprotectin, Fecal: 204 ug/g — ABNORMAL HIGH (ref 0–120)

## 2019-06-11 ENCOUNTER — Other Ambulatory Visit: Payer: Self-pay | Admitting: Gastroenterology

## 2019-06-11 DIAGNOSIS — M7918 Myalgia, other site: Secondary | ICD-10-CM

## 2019-06-11 MED ORDER — DULOXETINE HCL 30 MG PO CPEP: 30.0000 mg | ORAL_CAPSULE | Freq: Every day | ORAL | 3 refills | Status: DC

## 2019-06-12 ENCOUNTER — Other Ambulatory Visit: Payer: Self-pay

## 2019-06-12 DIAGNOSIS — R109 Unspecified abdominal pain: Secondary | ICD-10-CM

## 2019-06-12 MED ORDER — NA SULFATE-K SULFATE-MG SULF 17.5-3.13-1.6 GM/177ML PO SOLN: 1.0000 | Freq: Once | ORAL | 0 refills | Status: AC

## 2019-06-13 ENCOUNTER — Other Ambulatory Visit: Payer: Self-pay

## 2019-06-13 DIAGNOSIS — Z1159 Encounter for screening for other viral diseases: Secondary | ICD-10-CM

## 2019-06-19 ENCOUNTER — Encounter: Payer: 59 | Admitting: Gastroenterology

## 2019-06-26 ENCOUNTER — Other Ambulatory Visit: Payer: Self-pay | Admitting: Adult Health

## 2019-06-26 ENCOUNTER — Other Ambulatory Visit: Payer: Self-pay | Admitting: Gastroenterology

## 2019-06-27 NOTE — Telephone Encounter (Signed)
Ok to refill tramadol? # and refills? Thanks

## 2019-06-27 NOTE — Telephone Encounter (Signed)
Jan I recently placed this patient on Cymbalta for pain. Is the patient requesting this refill or her pharmacy? Thanks

## 2019-06-27 NOTE — Telephone Encounter (Signed)
Pharmacy

## 2019-06-27 NOTE — Telephone Encounter (Signed)
I would hold off unless the patient is specifically requesting this. Thanks

## 2019-06-28 ENCOUNTER — Other Ambulatory Visit: Payer: Self-pay | Admitting: Gastroenterology

## 2019-06-28 ENCOUNTER — Other Ambulatory Visit: Payer: Self-pay

## 2019-06-28 DIAGNOSIS — R109 Unspecified abdominal pain: Secondary | ICD-10-CM

## 2019-06-28 MED ORDER — TRAMADOL HCL 50 MG PO TABS: 50.0000 mg | ORAL_TABLET | Freq: Three times a day (TID) | ORAL | 0 refills | Status: DC | PRN

## 2019-06-28 MED ORDER — PANTOPRAZOLE SODIUM 40 MG PO TBEC: 40.0000 mg | DELAYED_RELEASE_TABLET | Freq: Two times a day (BID) | ORAL | 1 refills | Status: DC

## 2019-07-05 ENCOUNTER — Telehealth: Payer: Self-pay

## 2019-07-05 MED ORDER — CYCLOBENZAPRINE HCL 10 MG PO TABS: 10.0000 mg | ORAL_TABLET | Freq: Every day | ORAL | 1 refills | Status: DC

## 2019-07-05 NOTE — Telephone Encounter (Signed)
If this helps her, yes okay to refill thanks

## 2019-07-05 NOTE — Telephone Encounter (Signed)
OK to refill 90 day supply of cyclobenzaprine 10mg  daily at bedtime? Thank you.

## 2019-07-05 NOTE — Telephone Encounter (Signed)
Script sent to [facility]

## 2019-07-24 ENCOUNTER — Other Ambulatory Visit: Payer: Self-pay | Admitting: Adult Health

## 2019-07-25 ENCOUNTER — Other Ambulatory Visit: Payer: Self-pay | Admitting: Adult Health

## 2019-07-26 ENCOUNTER — Telehealth: Payer: Self-pay

## 2019-07-26 ENCOUNTER — Other Ambulatory Visit: Payer: Self-pay

## 2019-07-26 MED ORDER — TRAMADOL HCL 50 MG PO TABS: 50.0000 mg | ORAL_TABLET | Freq: Every day | ORAL | 0 refills | Status: DC | PRN

## 2019-07-26 NOTE — Telephone Encounter (Signed)
Okay I'm glad to hear that she is already on the higher dose. Let's continue it.  Unfortunately Tramadol not a great option right. If used sparingly and she has taken it on cymbalta could take one per day or so. Otherwise our other options are narcotics and would recommend she avoid those if at all possible. Thanks

## 2019-07-26 NOTE — Telephone Encounter (Signed)
Called patient and she states she has already been on Cymbalta-60mg  for 1 month. Her script was started on 06/11/19, 30mg  for 2 weeks, then it said to go to 60mg  daily. Should she not take the Tramadol at all now. Please advise. We are working on scheduling her colonoscopy

## 2019-07-26 NOTE — Telephone Encounter (Signed)
Called patient and let her know Dr. Havery Moros would like her to continue the 60mg  dose of Cymbalta and to take the Tramadol sparingly, once a day IF needed. Sent in refill of Tramadol.

## 2019-08-02 ENCOUNTER — Other Ambulatory Visit: Payer: Self-pay | Admitting: Adult Health

## 2019-08-02 ENCOUNTER — Encounter: Payer: Self-pay | Admitting: Adult Health

## 2019-08-02 MED ORDER — ATORVASTATIN CALCIUM 10 MG PO TABS: ORAL_TABLET | ORAL | 0 refills | Status: DC

## 2019-08-02 NOTE — Telephone Encounter (Signed)
Refill request received via mychart. AS, CMA

## 2019-08-06 ENCOUNTER — Encounter: Payer: 59 | Admitting: Gastroenterology

## 2019-08-13 ENCOUNTER — Ambulatory Visit (AMBULATORY_SURGERY_CENTER): Payer: 59 | Admitting: *Deleted

## 2019-08-13 ENCOUNTER — Encounter: Payer: Self-pay | Admitting: Gastroenterology

## 2019-08-13 ENCOUNTER — Other Ambulatory Visit: Payer: Self-pay

## 2019-08-13 VITALS — Temp 96.2°F | Ht 64.0 in | Wt 154.0 lb

## 2019-08-13 DIAGNOSIS — Z1159 Encounter for screening for other viral diseases: Secondary | ICD-10-CM

## 2019-08-13 DIAGNOSIS — R109 Unspecified abdominal pain: Secondary | ICD-10-CM

## 2019-08-13 NOTE — Progress Notes (Signed)
Patient is here in-person for PV. Patient denies any allergies to eggs or soy. Patient denies any problems with anesthesia/sedation. Patient denies any oxygen use at home. Patient denies taking any diet/weight loss medications or blood thinners. Patient is not being treated for MRSA or C-diff. EMMI education assisgned to the patient for the procedure, this was explained and instructions given to patient. COVID-19 screening test is on 12/23, the pt is aware. Pt is aware that care partner will wait in the car during procedure; if they feel like they will be too hot or cold to wait in the car; they may wait in the 4 th floor lobby. Patient is aware to bring only one care partner. We want them to wear a mask (we do not have any that we can provide them), practice social distancing, and we will check their temperatures when they get here.  I did remind the patient that their care partner needs to stay in the parking lot the entire time and have a cell phone available, we will call them when the pt is ready for discharge. Patient will wear mask into building.    Suprep-pt has at home.

## 2019-08-14 ENCOUNTER — Other Ambulatory Visit: Payer: Self-pay

## 2019-08-14 ENCOUNTER — Ambulatory Visit (INDEPENDENT_AMBULATORY_CARE_PROVIDER_SITE_OTHER): Payer: 59

## 2019-08-14 DIAGNOSIS — Z1159 Encounter for screening for other viral diseases: Secondary | ICD-10-CM

## 2019-08-14 MED ORDER — DULOXETINE HCL 30 MG PO CPEP: 60.0000 mg | ORAL_CAPSULE | Freq: Every day | ORAL | 3 refills | Status: DC

## 2019-08-15 ENCOUNTER — Telehealth: Payer: Self-pay

## 2019-08-15 LAB — SARS CORONAVIRUS 2 (TAT 6-24 HRS): SARS Coronavirus 2: NEGATIVE

## 2019-08-15 NOTE — Telephone Encounter (Signed)
Incoming fax from [facility] for Duloxetine HCI 20m BID for a prior auth. PA has been submitted with covermymeds.

## 2019-08-15 NOTE — Telephone Encounter (Signed)
Approved through 08-14-2020

## 2019-08-19 ENCOUNTER — Ambulatory Visit (AMBULATORY_SURGERY_CENTER): Payer: 59 | Admitting: Gastroenterology

## 2019-08-19 ENCOUNTER — Other Ambulatory Visit: Payer: Self-pay

## 2019-08-19 ENCOUNTER — Encounter: Payer: Self-pay | Admitting: Gastroenterology

## 2019-08-19 VITALS — BP 107/68 | HR 68 | Temp 98.6°F | Resp 10 | Ht 64.0 in | Wt 154.0 lb

## 2019-08-19 DIAGNOSIS — K529 Noninfective gastroenteritis and colitis, unspecified: Secondary | ICD-10-CM

## 2019-08-19 DIAGNOSIS — K633 Ulcer of intestine: Secondary | ICD-10-CM | POA: Diagnosis not present

## 2019-08-19 DIAGNOSIS — R109 Unspecified abdominal pain: Secondary | ICD-10-CM

## 2019-08-19 MED ORDER — SODIUM CHLORIDE 0.9 % IV SOLN: 500.0000 mL | Freq: Once | INTRAVENOUS | Status: DC

## 2019-08-19 NOTE — Progress Notes (Signed)
To PACU VSS. Report to [email or web address]

## 2019-08-19 NOTE — Patient Instructions (Signed)
YOU HAD AN ENDOSCOPIC PROCEDURE TODAY AT [facility]:   Refer to the procedure report that was given to you for any specific questions about what was found during the examination.  If the procedure report does not answer your questions, please call your gastroenterologist to clarify.  If you requested that your care partner not be given the details of your procedure findings, then the procedure report has been included in a sealed envelope for you to review at your convenience later.  YOU SHOULD EXPECT: Some feelings of bloating in the abdomen. Passage of more gas than usual.  Walking can help get rid of the air that was put into your GI tract during the procedure and reduce the bloating. If you had a lower endoscopy (such as a colonoscopy or flexible sigmoidoscopy) you may notice spotting of blood in your stool or on the toilet paper. If you underwent a bowel prep for your procedure, you may not have a normal bowel movement for a few days.  Please Note:  You might notice some irritation and congestion in your nose or some drainage.  This is from the oxygen used during your procedure.  There is no need for concern and it should clear up in a day or so.  SYMPTOMS TO REPORT IMMEDIATELY:   Following lower endoscopy (colonoscopy or flexible sigmoidoscopy):  Excessive amounts of blood in the stool  Significant tenderness or worsening of abdominal pains  Swelling of the abdomen that is new, acute  Fever of 100F or higher   For urgent or emergent issues, a gastroenterologist can be reached at any hour by calling [phone].   DIET:  We do recommend a small meal at first, but then you may proceed to your regular diet.  Drink plenty of fluids but you should avoid alcoholic beverages for 24 hours.  MEDICATIONS: Continue present medications.  Please see handouts given to you by your recovery nurse.  ACTIVITY:  You should plan to take it easy for the rest of today and you should  NOT DRIVE or use heavy machinery until tomorrow (because of the sedation medicines used during the test).    FOLLOW UP: Our staff will call the number listed on your records 48-72 hours following your procedure to check on you and address any questions or concerns that you may have regarding the information given to you following your procedure. If we do not reach you, we will leave a message.  We will attempt to reach you two times.  During this call, we will ask if you have developed any symptoms of COVID 19. If you develop any symptoms (ie: fever, flu-like symptoms, shortness of breath, cough etc.) before then, please call [phone].  If you test positive for Covid 19 in the 2 weeks post procedure, please call and report this information to [place].    If any biopsies were taken you will be contacted by phone or by letter within the next 1-3 weeks.  Please call us at [phone] if you have not heard about the biopsies in 3 weeks.   Thank you for allowing [place] to provide for your healthcare needs today.   SIGNATURES/CONFIDENTIALITY: You and/or your care partner have signed paperwork which will be entered into your electronic medical record.  These signatures attest to the fact that that the information above on your After Visit Summary has been reviewed and is understood.  Full responsibility of the confidentiality of this discharge information lies with you and/or  your care-partner.

## 2019-08-19 NOTE — Progress Notes (Signed)
Called to room to assist during endoscopic procedure.  Patient ID and intended procedure confirmed with present staff. Received instructions for my participation in the procedure from the performing physician.  

## 2019-08-19 NOTE — Op Note (Signed)
[place] Patient Name: Hailey Galvan Procedure Date: 08/19/2019 9:59 AM MRN: [id number] Endoscopist: Remo Lipps P. Havery Moros , MD Age: 63 Referring MD:  Date of Birth: 12/19/1955 Gender: Female Account #: [id number] Procedure:                Colonoscopy Indications:              Abdominal pain, Suspected Crohn's disease of the                            small bowel - mild inflammatory changes on MRE,                            elevated fecal calprotectin, however no response to                            trial of prednisone in the past Medicines:                Monitored Anesthesia Care Procedure:                Pre-Anesthesia Assessment:                           - Prior to the procedure, a History and Physical                            was performed, and patient medications and                            allergies were reviewed. The patient's tolerance of                            previous anesthesia was also reviewed. The risks                            and benefits of the procedure and the sedation                            options and risks were discussed with the patient.                            All questions were answered, and informed consent                            was obtained. Prior Anticoagulants: The patient has                            taken no previous anticoagulant or antiplatelet                            agents. ASA Grade Assessment: II - A patient with                            mild systemic disease. After reviewing the risks  and benefits, the patient was deemed in                            satisfactory condition to undergo the procedure.                           After obtaining informed consent, the colonoscope                            was passed under direct vision. Throughout the                            procedure, the patient's blood pressure, pulse, and                            oxygen saturations were  monitored continuously. The                            Colonoscope was introduced through the anus and                            advanced to the the terminal ileum, with                            identification of the appendiceal orifice and IC                            valve. The colonoscopy was performed without                            difficulty. The patient tolerated the procedure                            well. The quality of the bowel preparation was                            good. The terminal ileum, ileocecal valve,                            appendiceal orifice, and rectum were photographed. Scope In: 10:05:53 AM Scope Out: 10:32:03 AM Scope Withdrawal Time: 0 hours 22 minutes 35 seconds  Total Procedure Duration: 0 hours 26 minutes 10 seconds  Findings:                 The perianal and digital rectal examinations were                            normal.                           The terminal ileum contained multiple small                            erosions and one small frank ulceration. Biopsies  were taken with a cold forceps for histology.                           Scattered small-mouthed diverticula were found in                            the entire colon.                           The colon was tortuous.                           Internal hemorrhoids were found during                            retroflexion, with one hypertrophied anal papillae.                           The exam was otherwise without abnormality. Complications:            No immediate complications. Estimated blood loss:                            Minimal. Estimated Blood Loss:     Estimated blood loss was minimal. Impression:               - Multiple erosions in the terminal ileum, with one                            frank ulcer - suspicious for underlying Crohn's                            disease. Biopsied.                           - Diverticulosis in the entire  examined colon.                           - Tortuous colon.                           - Internal hemorrhoids.                           - The examination was otherwise normal. Recommendation:           - Patient has a contact number available for                            emergencies. The signs and symptoms of potential                            delayed complications were discussed with the                            patient. Return to normal activities tomorrow.  Written discharge instructions were provided to the                            patient.                           - Resume previous diet.                           - Continue present medications.                           - Await pathology results with further                            recommendations, will discuss options with the                            patient Hailey Galvan. Hailey Donahey, MD 08/19/2019 10:37:53 AM This report has been signed electronically.

## 2019-08-21 ENCOUNTER — Other Ambulatory Visit: Payer: Self-pay | Admitting: Gastroenterology

## 2019-08-21 ENCOUNTER — Telehealth: Payer: Self-pay

## 2019-08-21 NOTE — Telephone Encounter (Signed)
  Follow up Call-  Call back number 08/19/2019  Post procedure Call Back phone  # [phone]  Permission to leave phone message Yes  Some recent data might be hidden     Patient questions:  Do you have a fever, pain , or abdominal swelling? No. Pain Score  0 *  Have you tolerated food without any problems? Yes.    Have you been able to return to your normal activities? Yes.    Do you have any questions about your discharge instructions: Diet   No. Medications  No. Follow up visit  No.  Do you have questions or concerns about your Care? No.  Actions: * If pain score is 4 or above: No action needed, pain <4.  1. Have you developed a fever since your procedure? no  2.   Have you had an respiratory symptoms (SOB or cough) since your procedure? no  3.   Have you tested positive for COVID 19 since your procedure no  4.   Have you had any family members/close contacts diagnosed with the COVID 19 since your procedure?  no   If yes to any of these questions please route to Joylene John, RN and Alphonsa Gin, Therapist, sports.

## 2019-08-27 ENCOUNTER — Encounter: Payer: 59 | Admitting: Gastroenterology

## 2019-08-27 ENCOUNTER — Telehealth: Payer: Self-pay | Admitting: Gastroenterology

## 2019-08-27 DIAGNOSIS — K219 Gastro-esophageal reflux disease without esophagitis: Secondary | ICD-10-CM

## 2019-08-27 MED ORDER — DEXLANSOPRAZOLE 60 MG PO CPDR: 60.0000 mg | DELAYED_RELEASE_CAPSULE | Freq: Every day | ORAL | 3 refills | Status: DC

## 2019-08-27 MED ORDER — SUCRALFATE 1 GM/10ML PO SUSP: 1.0000 g | Freq: Four times a day (QID) | ORAL | 1 refills | Status: DC | PRN

## 2019-08-27 NOTE — Telephone Encounter (Signed)
Called the patient and discussed biopsy results from her colonoscopy.  On colonoscopy she had 1 frank ulceration in the ileum and multiple erosions.  Biopsies surprisingly are coming back as normal.  She is had an extensive evaluation in the past with nonspecific ileitis on past on prior colonoscopy, CT scan and MRE showing findings concerning for small bowel Crohn's disease.  Her fecal calprotectin has been elevated.  Given the bulk of evidence so far over the past 2 years on multiple studies, I do suspect she probably has Crohn's disease, although I'm surprised by her recent biopsies, not sure if this is sampling error.  We discussed this for a bit.  More recently she has not been having any lower abdominal pain or bowel changes.  She is actually been doing pretty well in this regard.  We discussed the next step if she has symptoms from this would be a trial of a biologic such as Humira.  Given she is actually feeling better recently she wants to hold off on this for now.  The main symptoms that are bothering her are reflux and epigastric pain.  She has had prior work-up with EGD, manometry, barium swallow in the past.  She does have a small hiatal hernia.  She is taking Protonix twice a day but still having some symptoms.  She is using Rolaids which worked well for her as needed.  Recommend we switch her from Protonix to Dexilant 60 mg once a day and will add some liquid Carafate as needed to see if we can get control of her symptoms.  If she has symptoms of reflux persistent she may need repair of the hiatal hernia or consideration for TIF if she is a candidate.  She will touch base with me in a few weeks to let me know how she is doing.  All questions answered she agreed with the plan

## 2019-09-22 ENCOUNTER — Other Ambulatory Visit: Payer: Self-pay | Admitting: Adult Health

## 2019-09-23 ENCOUNTER — Encounter: Payer: Self-pay | Admitting: Adult Health

## 2019-09-23 ENCOUNTER — Other Ambulatory Visit: Payer: Self-pay | Admitting: Gastroenterology

## 2019-09-23 ENCOUNTER — Other Ambulatory Visit: Payer: Self-pay | Admitting: Adult Health

## 2019-09-23 ENCOUNTER — Telehealth: Payer: Self-pay

## 2019-09-23 NOTE — Telephone Encounter (Signed)
Yes can give 1 month refill. Thanks

## 2019-09-23 NOTE — Telephone Encounter (Signed)
Please advise on refills for Tramadol

## 2019-09-23 NOTE — Telephone Encounter (Signed)
Resent referral to [facility] for Paraesophageal Hernia repair (per patient request)

## 2019-09-24 ENCOUNTER — Other Ambulatory Visit: Payer: Self-pay

## 2019-09-24 MED ORDER — TRAMADOL HCL 50 MG PO TABS: 50.0000 mg | ORAL_TABLET | Freq: Three times a day (TID) | ORAL | 0 refills | Status: DC | PRN

## 2019-09-24 NOTE — Progress Notes (Signed)
Script did not print properly. Printed and faxed to [employer]

## 2019-09-25 ENCOUNTER — Other Ambulatory Visit: Payer: Self-pay

## 2019-09-25 ENCOUNTER — Encounter: Payer: Self-pay | Admitting: Adult Health

## 2019-09-25 ENCOUNTER — Ambulatory Visit: Payer: 59 | Admitting: Adult Health

## 2019-10-05 ENCOUNTER — Other Ambulatory Visit: Payer: Self-pay | Admitting: Gastroenterology

## 2019-10-07 NOTE — Telephone Encounter (Signed)
OK to refill Cymbalta 3m daily  for 90 days with refills?

## 2019-10-08 ENCOUNTER — Other Ambulatory Visit: Payer: Self-pay | Admitting: Adult Health

## 2019-10-08 NOTE — Telephone Encounter (Signed)
Yes okay to refill if it helps her. Can also give her the 57m capsule if that is easier for her. Thanks

## 2019-10-10 ENCOUNTER — Other Ambulatory Visit: Payer: 59

## 2019-10-16 ENCOUNTER — Encounter: Payer: 59 | Admitting: Adult Health

## 2019-10-22 ENCOUNTER — Ambulatory Visit (INDEPENDENT_AMBULATORY_CARE_PROVIDER_SITE_OTHER)
Admission: RE | Admit: 2019-10-22 | Discharge: 2019-10-22 | Disposition: A | Discharge status: Home / Self Care | Payer: 59 | Source: Ambulatory Visit

## 2019-10-22 ENCOUNTER — Other Ambulatory Visit: Payer: Self-pay

## 2019-10-22 DIAGNOSIS — R300 Dysuria: Secondary | ICD-10-CM | POA: Diagnosis not present

## 2019-10-22 DIAGNOSIS — N898 Other specified noninflammatory disorders of vagina: Secondary | ICD-10-CM

## 2019-10-22 DIAGNOSIS — R369 Urethral discharge, unspecified: Secondary | ICD-10-CM

## 2019-10-22 MED ORDER — FLUCONAZOLE 200 MG PO TABS: ORAL_TABLET | ORAL | 0 refills | Status: DC

## 2019-10-22 MED ORDER — NITROFURANTOIN MONOHYD MACRO 100 MG PO CAPS: 100.0000 mg | ORAL_CAPSULE | Freq: Two times a day (BID) | ORAL | 0 refills | Status: DC

## 2019-10-22 NOTE — ED Provider Notes (Signed)
[place]   Virtual Visit via Video Note:  Hailey Galvan  initiated request for Telemedicine visit with [facility] team. I connected with Hailey Galvan  on 10/22/2019 at 3:14 PM  for a synchronized telemedicine visit using a video enabled HIPPA compliant telemedicine application. I verified that I am speaking with Hailey Galvan  using two identifiers. Hailey Box, PA-C  was physically located in a [facility] Urgent care site and Hailey Galvan was located at a different location.   The limitations of evaluation and management by telemedicine as well as the availability of in-person appointments were discussed. Patient was informed that she  may incur a bill ( including co-pay) for this virtual visit encounter. Hailey Galvan  expressed understanding and gave verbal consent to proceed with virtual visit.   [id number] 10/22/19 Arrival Time: 0258  CC: urethral discharge  SUBJECTIVE:  Hailey Galvan is a 64 y.o. female who complains of intermittent dysuria, and yellow urethral discharge x couple of weeks.  Patient denies a precipitating event, recent sexual encounter, excessive caffeine intake.  Last sex was 10 years ago.  Pain is intermittent burning.  Has NOT tried OTC medications.  Denies aggravating factors.  Admits to similar symptoms in the past.  Complains of mild vaginal itching.  Denies fever, chills, nausea, vomiting, abdominal pain, flank pain, abnormal vaginal discharge, vaginal odor, vaginal pain, vaginal lesions or rash.    LMP: No LMP recorded. Patient has had a hysterectomy.  ROS: As in HPI.  All other pertinent ROS negative.     Past Medical History:  Diagnosis Date  . Allergy   . Crohn disease ([place])   . Depression .  Marland Kitchen Hiatal hernia   . Ileitis    Past Surgical History:  Procedure Laterality Date  . ABDOMINAL HYSTERECTOMY    . BREAST EXCISIONAL BIOPSY Right   . BREAST SURGERY     Tumor Removal  . CESAREAN SECTION    . COLONOSCOPY  last  03/30/2018   at [facility]=polyps  . KNEE SURGERY    . TONSILLECTOMY     Allergies  Allergen Reactions  . Etodolac Other (See Comments)    Sleepiness, feeling "out of it" Sleepiness, feeling "out of it"   . Naproxen   . Tizanidine Other (See Comments)  . Zanaflex [Tizanidine Hcl]    Current Facility-Administered Medications on File Prior to Encounter  Medication Dose Route Frequency Provider Last Rate Last Admin  . 0.9 %  sodium chloride infusion  500 mL Intravenous Once Armbruster, [name], MD       Current Outpatient Medications on File Prior to Encounter  Medication Sig Dispense Refill  . atorvastatin (LIPITOR) 10 MG tablet TAKE 1 TABLET BY MOUTH ONCE DAILY AT  6  P.M. **PATIENT NEEDS APT FOR FURTHER REFILLS** 30 tablet 0  . butalbital-acetaminophen-caffeine (FIORICET) 50-325-40 MG tablet TAKE 1 TABLET BY MOUTH EVERY 6 HOURS AS NEEDED 14 tablet 0  . cyanocobalamin 2000 MCG tablet Take 2,000 mcg by mouth daily.    . cyclobenzaprine (FLEXERIL) 10 MG tablet Take 1 tablet (10 mg total) by mouth at bedtime. 90 tablet 1  . dexlansoprazole (DEXILANT) 60 MG capsule Take 1 capsule (60 mg total) by mouth daily. 30 capsule 3  . DULoxetine (CYMBALTA) 60 MG capsule Take 1 capsule (60 mg total) by mouth daily. 90 capsule 3  . gabapentin (NEURONTIN) 600 MG tablet Take 1 tablet (600 mg total) by mouth daily. 30 tablet 3  . pantoprazole (PROTONIX) 40 MG  tablet Take 1 tablet (40 mg total) by mouth 2 (two) times daily. 180 tablet 1  . sucralfate (CARAFATE) 1 GM/10ML suspension Take 10 mLs (1 g total) by mouth every 6 (six) hours as needed. 420 mL 1  . traMADol (ULTRAM) 50 MG tablet Take 1 tablet (50 mg total) by mouth every 8 (eight) hours as needed. 90 tablet 0  . [DISCONTINUED] Hyoscyamine Sulfate SL (LEVSIN/SL) 0.125 MG SUBL Place 1 tablet under the tongue every 6 (six) hours as needed. (Patient not taking: Reported on 08/13/2019) 90 tablet 1   OBJECTIVE:  There were no vitals filed for this visit.    General appearance: alert; no distress Eyes: EOMI grossly HENT: normocephalic; atraumatic Neck: supple with FROM Lungs: normal respiratory effort; speaking in full sentences without difficulty Extremities: moves extremities without difficulty Skin: No obvious rashes Neurologic: No facial asymmetries Psychological: alert and cooperative; normal mood and affect  ASSESSMENT & PLAN:  1. Urethral discharge   2. Dysuria   3. Vaginal itching     Meds ordered this encounter  Medications  . nitrofurantoin, macrocrystal-monohydrate, (MACROBID) 100 MG capsule    Sig: Take 1 capsule (100 mg total) by mouth 2 (two) times daily.    Dispense:  10 capsule    Refill:  0    Order Specific Question:   Supervising Provider    Answer:   Raylene Everts [[id number]]  . fluconazole (DIFLUCAN) 200 MG tablet    Sig: Take one dose by mouth, wait 72 hours, and then take second dose by mouth    Dispense:  2 tablet    Refill:  0    Order Specific Question:   Supervising Provider    Answer:   Raylene Everts [[id number]]    Symptoms concerning for UTI and yeast infection Push fluids and get plenty of rest.   Macrobid prescribed for UTI.  Take as directed and to completion Diflucan prescribed for yeast infection.  Take as directed.   Follow up with PCP if symptoms persists Follow up in person or go to ER if you have any new or worsening symptoms such as fever, abdominal pain, nausea/vomiting, flank pain, worsening symptoms despite medications, etc...  I discussed the assessment and treatment plan with the patient. The patient was provided an opportunity to ask questions and all were answered. The patient agreed with the plan and demonstrated an understanding of the instructions.   The patient was advised to call back or seek an in-person evaluation if the symptoms worsen or if the condition fails to improve as anticipated.  I provided 8 minutes of non-face-to-face time during this  encounter.  Hailey Lakes, PA-C  10/22/2019 3:14 PM      Hailey Box, PA-C 10/22/19 1516

## 2019-10-22 NOTE — Discharge Instructions (Addendum)
Symptoms concerning for UTI and yeast infection Push fluids and get plenty of rest.   Macrobid prescribed for UTI.  Take as directed and to completion Diflucan prescribed for yeast infection.  Take as directed.   Follow up with PCP if symptoms persists Follow up in person or go to ER if you have any new or worsening symptoms such as fever, abdominal pain, nausea/vomiting, flank pain, worsening symptoms despite medications, etc..Marland Kitchen

## 2019-10-23 ENCOUNTER — Other Ambulatory Visit: Payer: Self-pay

## 2019-10-23 MED ORDER — SUCRALFATE 1 GM/10ML PO SUSP: 1.0000 g | Freq: Four times a day (QID) | ORAL | 1 refills | Status: DC | PRN

## 2019-10-29 ENCOUNTER — Ambulatory Visit: Payer: Self-pay | Admitting: General Surgery

## 2019-10-30 ENCOUNTER — Other Ambulatory Visit: Payer: Self-pay

## 2019-10-30 MED ORDER — GABAPENTIN 600 MG PO TABS: 600.0000 mg | ORAL_TABLET | Freq: Every day | ORAL | 3 refills | Status: DC

## 2019-11-27 ENCOUNTER — Other Ambulatory Visit: Payer: Self-pay | Admitting: Adult Health

## 2019-12-06 NOTE — Patient Instructions (Addendum)
DUE TO COVID-19 ONLY ONE VISITOR IS ALLOWED TO COME WITH YOU AND STAY IN THE WAITING ROOM ONLY DURING PRE OP AND PROCEDURE DAY OF SURGERY. THE 1 VISITOR MAY VISIT WITH YOU AFTER SURGERY IN YOUR PRIVATE ROOM DURING VISITING HOURS ONLY!  YOU NEED TO HAVE A COVID 19 TEST ON: 12/17/19@ 3:00 pm , THIS TEST MUST BE DONE BEFORE SURGERY, COME  [address], [place] , [address].  ([place]) ONCE YOUR COVID TEST IS COMPLETED, PLEASE BEGIN THE QUARANTINE INSTRUCTIONS AS OUTLINED IN YOUR HANDOUT.                Arra Connaughton    Your procedure is scheduled on: 12/20/19   Report to [facility] Main  Entrance   Report to [place] at: 5:30 AM     Call this number if you have problems the morning of surgery [phone]    Remember: Do not eat food or drink liquids :After Midnight.   You can take your medicines with a sip of water: Adderall,Cymbalta,Omeprazole.   BRUSH YOUR TEETH MORNING OF SURGERY AND RINSE YOUR MOUTH OUT, NO CHEWING GUM CANDY OR M    You may not have any metal on your body including hair pins and              piercings  Do not wear jewelry, make-up, lotions, powders or perfumes, deodorant             Do not wear nail polish on your fingernails.  Do not shave  48 hours prior to surgery.               Do not bring valuables to the hospital. [place].  Contacts, dentures or bridgework may not be worn into surgery.  Leave suitcase in the car. After surgery it may be brought to your room.     Patients discharged the day of surgery will not be allowed to drive home. IF YOU ARE HAVING SURGERY AND GOING HOME THE SAME DAY, YOU MUST HAVE AN ADULT TO DRIVE YOU HOME AND BE WITH YOU FOR 24 HOURS. YOU MAY GO HOME BY TAXI OR UBER OR ORTHERWISE, BUT AN ADULT MUST ACCOMPANY YOU HOME AND STAY WITH YOU FOR 24 HOURS.  Name and phone number of your driver:  Special Instructions: N/A              Please read over  the following fact sheets you were given: _____________________________________________________________________             [facility] - Preparing for Surgery Before surgery, you can play an important role.  Because skin is not sterile, your skin needs to be as free of germs as possible.  You can reduce the number of germs on your skin by washing with CHG (chlorahexidine gluconate) soap before surgery.  CHG is an antiseptic cleaner which kills germs and bonds with the skin to continue killing germs even after washing. Please DO NOT use if you have an allergy to CHG or antibacterial soaps.  If your skin becomes reddened/irritated stop using the CHG and inform your nurse when you arrive at Short Stay. Do not shave (including legs and underarms) for at least 48 hours prior to the first CHG shower.  You may shave your face/neck. Please follow these instructions carefully:  1.  Shower with CHG Soap the  night before surgery and the  morning of Surgery.  2.  If you choose to wash your hair, wash your hair first as usual with your  normal  shampoo.  3.  After you shampoo, rinse your hair and body thoroughly to remove the  shampoo.                           4.  Use CHG as you would any other liquid soap.  You can apply chg directly  to the skin and wash                       Gently with a scrungie or clean washcloth.  5.  Apply the CHG Soap to your body ONLY FROM THE NECK DOWN.   Do not use on face/ open                           Wound or open sores. Avoid contact with eyes, ears mouth and genitals (private parts).                       Wash face,  Genitals (private parts) with your normal soap.             6.  Wash thoroughly, paying special attention to the area where your surgery  will be performed.  7.  Thoroughly rinse your body with warm water from the neck down.  8.  DO NOT shower/wash with your normal soap after using and rinsing off  the CHG Soap.                9.  Pat yourself dry with a  clean towel.            10.  Wear clean pajamas.            11.  Place clean sheets on your bed the night of your first shower and do not  sleep with pets. Day of Surgery : Do not apply any lotions/deodorants the morning of surgery.  Please wear clean clothes to the hospital/surgery center.  FAILURE TO FOLLOW THESE INSTRUCTIONS MAY RESULT IN THE CANCELLATION OF YOUR SURGERY PATIENT SIGNATURE_________________________________  NURSE SIGNATURE__________________________________  ________________________________________________________________________

## 2019-12-09 ENCOUNTER — Encounter (HOSPITAL_COMMUNITY): Payer: Self-pay

## 2019-12-09 ENCOUNTER — Other Ambulatory Visit: Payer: Self-pay

## 2019-12-09 ENCOUNTER — Encounter (HOSPITAL_COMMUNITY)
Admission: RE | Admit: 2019-12-09 | Discharge: 2019-12-09 | Disposition: A | Discharge status: Home / Self Care | Payer: 59 | Source: Ambulatory Visit | Attending: General Surgery | Admitting: General Surgery

## 2019-12-09 ENCOUNTER — Encounter (HOSPITAL_COMMUNITY): Admission: RE | Admit: 2019-12-09 | Payer: 59 | Source: Ambulatory Visit

## 2019-12-09 ENCOUNTER — Encounter (HOSPITAL_COMMUNITY): Payer: 59

## 2019-12-09 DIAGNOSIS — Z01812 Encounter for preprocedural laboratory examination: Secondary | ICD-10-CM | POA: Insufficient documentation

## 2019-12-09 NOTE — Progress Notes (Addendum)
PCP - No PCP Cardiologist -   Chest x-ray - 05/28/19. EPIC EKG -  Stress Test -  ECHO -  Cardiac Cath -   Sleep Study -  CPAP -   Fasting Blood Sugar -  Checks Blood Sugar _____ times a day  Blood Thinner Instructions: Aspirin Instructions: Last Dose:  Anesthesia review:   Patient denies shortness of breath, fever, cough and chest pain at PAT appointment   Patient verbalized understanding of instructions that were given to them at the PAT appointment. Patient was also instructed that they will need to review over the PAT instructions again at home before surgery.

## 2019-12-11 ENCOUNTER — Encounter (HOSPITAL_COMMUNITY)
Admission: RE | Admit: 2019-12-11 | Discharge: 2019-12-11 | Disposition: A | Discharge status: Home / Self Care | Payer: 59 | Source: Ambulatory Visit | Attending: General Surgery | Admitting: General Surgery

## 2019-12-11 ENCOUNTER — Other Ambulatory Visit: Payer: Self-pay

## 2019-12-11 DIAGNOSIS — Z01812 Encounter for preprocedural laboratory examination: Secondary | ICD-10-CM | POA: Diagnosis not present

## 2019-12-11 LAB — ABO/RH: ABO/RH(D): O POS

## 2019-12-11 LAB — COMPREHENSIVE METABOLIC PANEL
ALT: 58 U/L — ABNORMAL HIGH (ref 0–44)
AST: 45 U/L — ABNORMAL HIGH (ref 15–41)
Albumin: 4.5 g/dL (ref 3.5–5.0)
Alkaline Phosphatase: 113 U/L (ref 38–126)
Anion gap: 11 (ref 5–15)
BUN: 11 mg/dL (ref 8–23)
CO2: 27 mmol/L (ref 22–32)
Calcium: 9.9 mg/dL (ref 8.9–10.3)
Chloride: 102 mmol/L (ref 98–111)
Creatinine, Ser: 0.8 mg/dL (ref 0.44–1.00)
GFR calc Af Amer: >60 mL/min (ref >60)
GFR calc non Af Amer: >60 mL/min (ref >60)
Glucose, Bld: 116 mg/dL — ABNORMAL HIGH (ref 70–99)
Potassium: 4.6 mmol/L (ref 3.5–5.1)
Sodium: 140 mmol/L (ref 135–145)
Total Bilirubin: 0.5 mg/dL (ref 0.3–1.2)
Total Protein: 7.8 g/dL (ref 6.5–8.1)

## 2019-12-11 LAB — CBC WITH DIFFERENTIAL/PLATELET
Abs Immature Granulocytes: 0.04 10*3/uL (ref 0.00–0.07)
Basophils Absolute: 0.1 10*3/uL (ref 0.0–0.1)
Basophils Relative: 1 %
Eosinophils Absolute: 0.3 10*3/uL (ref 0.0–0.5)
Eosinophils Relative: 3 %
HCT: 39.4 % (ref 36.0–46.0)
Hemoglobin: 12.8 g/dL (ref 12.0–15.0)
Immature Granulocytes: 1 %
Lymphocytes Relative: 31 %
Lymphs Abs: 2.6 10*3/uL (ref 0.7–4.0)
MCH: 30 pg (ref 26.0–34.0)
MCHC: 32.5 g/dL (ref 30.0–36.0)
MCV: 92.5 fL (ref 80.0–100.0)
Monocytes Absolute: 0.9 10*3/uL (ref 0.1–1.0)
Monocytes Relative: 10 %
Neutro Abs: 4.6 10*3/uL (ref 1.7–7.7)
Neutrophils Relative %: 54 %
Platelets: 342 10*3/uL (ref 150–400)
RBC: 4.26 MIL/uL (ref 3.87–5.11)
RDW: 13 % (ref 11.5–15.5)
WBC: 8.4 10*3/uL (ref 4.0–10.5)
nRBC: 0 % (ref 0.0–0.2)

## 2019-12-17 ENCOUNTER — Other Ambulatory Visit (HOSPITAL_COMMUNITY)
Admission: RE | Admit: 2019-12-17 | Discharge: 2019-12-17 | Disposition: A | Discharge status: Home / Self Care | Payer: 59 | Source: Ambulatory Visit | Attending: General Surgery | Admitting: General Surgery

## 2019-12-17 DIAGNOSIS — Z01812 Encounter for preprocedural laboratory examination: Secondary | ICD-10-CM | POA: Diagnosis present

## 2019-12-17 DIAGNOSIS — Z20822 Contact with and (suspected) exposure to covid-19: Secondary | ICD-10-CM | POA: Diagnosis not present

## 2019-12-17 LAB — SARS CORONAVIRUS 2 (TAT 6-24 HRS): SARS Coronavirus 2: NEGATIVE

## 2019-12-19 NOTE — Anesthesia Preprocedure Evaluation (Addendum)
Anesthesia Evaluation  Patient identified by MRN, date of birth, ID band Patient awake    Reviewed: Allergy & Precautions, NPO status , Patient's Chart, lab work & pertinent test results  History of Anesthesia Complications Negative for: history of anesthetic complications  Airway Mallampati: II  TM Distance: >3 FB Neck ROM: Full    Dental  (+) Chipped, Missing, Dental Advisory Given   Pulmonary Current Smoker and Patient abstained from smoking.,  12/17/2019 SARS coronavirus NEG   breath sounds clear to auscultation       Cardiovascular negative cardio ROS   Rhythm:Regular Rate:Normal     Neuro/Psych negative neurological ROS     GI/Hepatic hiatal hernia, GERD  Poorly Controlled,Elevated LFTs Crohn's   Endo/Other  negative endocrine ROS  Renal/GU negative Renal ROS     Musculoskeletal   Abdominal   Peds  Hematology negative hematology ROS (+)   Anesthesia Other Findings   Reproductive/Obstetrics                            Anesthesia Physical Anesthesia Plan  ASA: II  Anesthesia Plan: General   Post-op Pain Management:    Induction: Intravenous, Rapid sequence and Cricoid pressure planned  PONV Risk Score and Plan: 3 and Scopolamine patch - Pre-op, Dexamethasone and Ondansetron  Airway Management Planned: Oral ETT  Additional Equipment:   Intra-op Plan:   Post-operative Plan: Extubation in OR  Informed Consent: I have reviewed the patients History and Physical, chart, labs and discussed the procedure including the risks, benefits and alternatives for the proposed anesthesia with the patient or authorized representative who has indicated his/her understanding and acceptance.     Dental advisory given  Plan Discussed with: CRNA and Surgeon  Anesthesia Plan Comments:        Anesthesia Quick Evaluation

## 2019-12-20 ENCOUNTER — Ambulatory Visit (HOSPITAL_COMMUNITY): Payer: 59 | Admitting: Certified Registered Nurse Anesthetist

## 2019-12-20 ENCOUNTER — Observation Stay (HOSPITAL_COMMUNITY): Payer: 59

## 2019-12-20 ENCOUNTER — Encounter (HOSPITAL_COMMUNITY)
Admission: AD | Disposition: A | Discharge status: Home / Self Care | Payer: Self-pay | Source: Home / Self Care | Attending: General Surgery

## 2019-12-20 ENCOUNTER — Observation Stay (HOSPITAL_COMMUNITY)
Admission: AD | Admit: 2019-12-20 | Discharge: 2019-12-21 | Disposition: A | Discharge status: Home / Self Care | Payer: 59 | Attending: General Surgery | Admitting: General Surgery

## 2019-12-20 ENCOUNTER — Other Ambulatory Visit: Payer: Self-pay

## 2019-12-20 ENCOUNTER — Ambulatory Visit (HOSPITAL_COMMUNITY): Payer: 59 | Admitting: Physician Assistant

## 2019-12-20 ENCOUNTER — Encounter (HOSPITAL_COMMUNITY): Payer: Self-pay | Admitting: General Surgery

## 2019-12-20 DIAGNOSIS — K5090 Crohn's disease, unspecified, without complications: Secondary | ICD-10-CM | POA: Diagnosis present

## 2019-12-20 DIAGNOSIS — Z886 Allergy status to analgesic agent status: Secondary | ICD-10-CM | POA: Insufficient documentation

## 2019-12-20 DIAGNOSIS — Z888 Allergy status to other drugs, medicaments and biological substances status: Secondary | ICD-10-CM | POA: Insufficient documentation

## 2019-12-20 DIAGNOSIS — F329 Major depressive disorder, single episode, unspecified: Secondary | ICD-10-CM | POA: Diagnosis not present

## 2019-12-20 DIAGNOSIS — Z8719 Personal history of other diseases of the digestive system: Secondary | ICD-10-CM

## 2019-12-20 DIAGNOSIS — Z79899 Other long term (current) drug therapy: Secondary | ICD-10-CM | POA: Insufficient documentation

## 2019-12-20 DIAGNOSIS — K219 Gastro-esophageal reflux disease without esophagitis: Principal | ICD-10-CM | POA: Insufficient documentation

## 2019-12-20 DIAGNOSIS — E785 Hyperlipidemia, unspecified: Secondary | ICD-10-CM | POA: Diagnosis not present

## 2019-12-20 DIAGNOSIS — K449 Diaphragmatic hernia without obstruction or gangrene: Secondary | ICD-10-CM | POA: Insufficient documentation

## 2019-12-20 DIAGNOSIS — M792 Neuralgia and neuritis, unspecified: Secondary | ICD-10-CM | POA: Diagnosis present

## 2019-12-20 DIAGNOSIS — N3941 Urge incontinence: Secondary | ICD-10-CM | POA: Diagnosis present

## 2019-12-20 DIAGNOSIS — R945 Abnormal results of liver function studies: Secondary | ICD-10-CM | POA: Diagnosis not present

## 2019-12-20 DIAGNOSIS — Z9889 Other specified postprocedural states: Secondary | ICD-10-CM

## 2019-12-20 LAB — TYPE AND SCREEN
ABO/RH(D): O POS
Antibody Screen: NEGATIVE

## 2019-12-20 SURGERY — REPAIR, HERNIA, PARAESOPHAGEAL, LAPAROSCOPIC
Anesthesia: General | Site: Abdomen

## 2019-12-20 MED ORDER — MIDAZOLAM HCL 5 MG/5ML IJ SOLN
INTRAMUSCULAR | Status: DC | PRN
  Administered 2019-12-20: 2 mg via INTRAVENOUS

## 2019-12-20 MED ORDER — KETAMINE HCL 10 MG/ML IJ SOLN
INTRAMUSCULAR | Status: DC | PRN
  Administered 2019-12-20: 30 mg via INTRAVENOUS

## 2019-12-20 MED ORDER — HYDROMORPHONE HCL 1 MG/ML IJ SOLN
INTRAMUSCULAR | Status: AC
  Filled 2019-12-20: qty 1

## 2019-12-20 MED ORDER — LIDOCAINE 2% (20 MG/ML) 5 ML SYRINGE
INTRAMUSCULAR | Status: AC
  Filled 2019-12-20: qty 5

## 2019-12-20 MED ORDER — DIPHENHYDRAMINE HCL 12.5 MG/5ML PO ELIX: 12.5000 mg | ORAL_SOLUTION | Freq: Four times a day (QID) | ORAL | Status: DC | PRN

## 2019-12-20 MED ORDER — MIDAZOLAM HCL 2 MG/2ML IJ SOLN
INTRAMUSCULAR | Status: AC
  Filled 2019-12-20: qty 2

## 2019-12-20 MED ORDER — ACETAMINOPHEN 500 MG PO TABS
1000.0000 mg | ORAL_TABLET | Freq: Four times a day (QID) | ORAL | Status: DC
  Administered 2019-12-20 – 2019-12-21 (×3): 1000 mg via ORAL
  Filled 2019-12-20 (×3): qty 2

## 2019-12-20 MED ORDER — CEFAZOLIN SODIUM-DEXTROSE 2-4 GM/100ML-% IV SOLN
2.0000 g | INTRAVENOUS | Status: AC
  Administered 2019-12-20: 2 g via INTRAVENOUS
  Filled 2019-12-20: qty 100

## 2019-12-20 MED ORDER — SUGAMMADEX SODIUM 200 MG/2ML IV SOLN
INTRAVENOUS | Status: DC | PRN
  Administered 2019-12-20: 200 mg via INTRAVENOUS

## 2019-12-20 MED ORDER — BUPIVACAINE HCL (PF) 0.25 % IJ SOLN
INTRAMUSCULAR | Status: DC | PRN
  Administered 2019-12-20: 30 mL

## 2019-12-20 MED ORDER — HYDROMORPHONE HCL 1 MG/ML IJ SOLN
0.2500 mg | INTRAMUSCULAR | Status: DC | PRN
  Administered 2019-12-20: 0.25 mg via INTRAVENOUS
  Administered 2019-12-20: 0.5 mg via INTRAVENOUS
  Administered 2019-12-20 (×2): 0.25 mg via INTRAVENOUS

## 2019-12-20 MED ORDER — DIPHENHYDRAMINE HCL 50 MG/ML IJ SOLN: 12.5000 mg | Freq: Four times a day (QID) | INTRAMUSCULAR | Status: DC | PRN

## 2019-12-20 MED ORDER — ONDANSETRON HCL 4 MG/2ML IJ SOLN
INTRAMUSCULAR | Status: DC | PRN
  Administered 2019-12-20: 4 mg via INTRAVENOUS

## 2019-12-20 MED ORDER — PROMETHAZINE HCL 25 MG/ML IJ SOLN: 12.5000 mg | Freq: Four times a day (QID) | INTRAMUSCULAR | Status: DC | PRN

## 2019-12-20 MED ORDER — SUCCINYLCHOLINE CHLORIDE 200 MG/10ML IV SOSY
PREFILLED_SYRINGE | INTRAVENOUS | Status: DC | PRN
  Administered 2019-12-20: 120 mg via INTRAVENOUS

## 2019-12-20 MED ORDER — SCOPOLAMINE 1 MG/3DAYS TD PT72
1.0000 | MEDICATED_PATCH | TRANSDERMAL | Status: DC
  Administered 2019-12-20: 1.5 mg via TRANSDERMAL
  Filled 2019-12-20: qty 1

## 2019-12-20 MED ORDER — PROPOFOL 10 MG/ML IV BOLUS
INTRAVENOUS | Status: DC | PRN
  Administered 2019-12-20: 120 mg via INTRAVENOUS

## 2019-12-20 MED ORDER — BUTALBITAL-APAP-CAFFEINE 50-325-40 MG PO TABS: 1.0000 | ORAL_TABLET | Freq: Four times a day (QID) | ORAL | Status: DC | PRN

## 2019-12-20 MED ORDER — LACTATED RINGERS IV SOLN: INTRAVENOUS | Status: DC

## 2019-12-20 MED ORDER — SUCCINYLCHOLINE CHLORIDE 200 MG/10ML IV SOSY
PREFILLED_SYRINGE | INTRAVENOUS | Status: AC
  Filled 2019-12-20: qty 10

## 2019-12-20 MED ORDER — SIMETHICONE 80 MG PO CHEW: 40.0000 mg | CHEWABLE_TABLET | Freq: Four times a day (QID) | ORAL | Status: DC | PRN

## 2019-12-20 MED ORDER — 0.9 % SODIUM CHLORIDE (POUR BTL) OPTIME
TOPICAL | Status: DC | PRN
  Administered 2019-12-20: 1000 mL

## 2019-12-20 MED ORDER — PROPOFOL 10 MG/ML IV BOLUS
INTRAVENOUS | Status: AC
  Filled 2019-12-20: qty 20

## 2019-12-20 MED ORDER — SCOPOLAMINE 1 MG/3DAYS TD PT72: 1.0000 | MEDICATED_PATCH | TRANSDERMAL | Status: DC

## 2019-12-20 MED ORDER — LIDOCAINE 2% (20 MG/ML) 5 ML SYRINGE
INTRAMUSCULAR | Status: DC | PRN
  Administered 2019-12-20: 1.5 mg/kg/h via INTRAVENOUS

## 2019-12-20 MED ORDER — PROMETHAZINE HCL 25 MG/ML IJ SOLN: 6.2500 mg | INTRAMUSCULAR | Status: DC | PRN

## 2019-12-20 MED ORDER — MORPHINE SULFATE (PF) 2 MG/ML IV SOLN
1.0000 mg | INTRAVENOUS | Status: DC | PRN
  Administered 2019-12-20: 1 mg via INTRAVENOUS
  Filled 2019-12-20: qty 1

## 2019-12-20 MED ORDER — HEPARIN SODIUM (PORCINE) 5000 UNIT/ML IJ SOLN
5000.0000 [IU] | Freq: Once | INTRAMUSCULAR | Status: AC
  Administered 2019-12-20: 06:00:00 5000 [IU] via SUBCUTANEOUS
  Filled 2019-12-20: qty 1

## 2019-12-20 MED ORDER — FENTANYL CITRATE (PF) 250 MCG/5ML IJ SOLN
INTRAMUSCULAR | Status: DC | PRN
  Administered 2019-12-20: 150 ug via INTRAVENOUS
  Administered 2019-12-20 (×2): 50 ug via INTRAVENOUS

## 2019-12-20 MED ORDER — ROCURONIUM BROMIDE 10 MG/ML (PF) SYRINGE
PREFILLED_SYRINGE | INTRAVENOUS | Status: AC
  Filled 2019-12-20: qty 10

## 2019-12-20 MED ORDER — DEXAMETHASONE SODIUM PHOSPHATE 10 MG/ML IJ SOLN
INTRAMUSCULAR | Status: AC
  Filled 2019-12-20: qty 1

## 2019-12-20 MED ORDER — BUPIVACAINE LIPOSOME 1.3 % IJ SUSP
20.0000 mL | Freq: Once | INTRAMUSCULAR | Status: AC
  Administered 2019-12-20: 20 mL
  Filled 2019-12-20: qty 20

## 2019-12-20 MED ORDER — LIDOCAINE 2% (20 MG/ML) 5 ML SYRINGE
INTRAMUSCULAR | Status: DC | PRN
  Administered 2019-12-20: 40 mg via INTRAVENOUS

## 2019-12-20 MED ORDER — ROCURONIUM BROMIDE 10 MG/ML (PF) SYRINGE
PREFILLED_SYRINGE | INTRAVENOUS | Status: DC | PRN
  Administered 2019-12-20 (×3): 20 mg via INTRAVENOUS
  Administered 2019-12-20: 50 mg via INTRAVENOUS
  Administered 2019-12-20: 20 mg via INTRAVENOUS

## 2019-12-20 MED ORDER — MEPERIDINE HCL 50 MG/ML IJ SOLN: 6.2500 mg | INTRAMUSCULAR | Status: DC | PRN

## 2019-12-20 MED ORDER — GABAPENTIN 250 MG/5ML PO SOLN
200.0000 mg | Freq: Two times a day (BID) | ORAL | Status: DC
  Administered 2019-12-21: 200 mg via ORAL
  Filled 2019-12-20 (×2): qty 4

## 2019-12-20 MED ORDER — LIDOCAINE HCL 2 % IJ SOLN
INTRAMUSCULAR | Status: AC
  Filled 2019-12-20: qty 20

## 2019-12-20 MED ORDER — DEXAMETHASONE SODIUM PHOSPHATE 10 MG/ML IJ SOLN
INTRAMUSCULAR | Status: DC | PRN
  Administered 2019-12-20: 5 mg via INTRAVENOUS

## 2019-12-20 MED ORDER — MIDAZOLAM HCL 2 MG/2ML IJ SOLN: 0.5000 mg | Freq: Once | INTRAMUSCULAR | Status: DC | PRN

## 2019-12-20 MED ORDER — DEXAMETHASONE SODIUM PHOSPHATE 4 MG/ML IJ SOLN: 4.0000 mg | INTRAMUSCULAR | Status: DC

## 2019-12-20 MED ORDER — KETAMINE HCL 10 MG/ML IJ SOLN
INTRAMUSCULAR | Status: AC
  Filled 2019-12-20: qty 1

## 2019-12-20 MED ORDER — OXYCODONE HCL 5 MG PO TABS
5.0000 mg | ORAL_TABLET | ORAL | Status: DC | PRN
  Administered 2019-12-20 – 2019-12-21 (×3): 5 mg via ORAL
  Administered 2019-12-21: 10 mg via ORAL
  Filled 2019-12-20 (×5): qty 1

## 2019-12-20 MED ORDER — CHLORHEXIDINE GLUCONATE CLOTH 2 % EX PADS: 6.0000 | MEDICATED_PAD | Freq: Once | CUTANEOUS | Status: DC

## 2019-12-20 MED ORDER — ONDANSETRON HCL 4 MG/2ML IJ SOLN
INTRAMUSCULAR | Status: AC
  Filled 2019-12-20: qty 2

## 2019-12-20 MED ORDER — PHENYLEPHRINE HCL (PRESSORS) 10 MG/ML IV SOLN
INTRAVENOUS | Status: AC
  Filled 2019-12-20: qty 1

## 2019-12-20 MED ORDER — SODIUM CHLORIDE (PF) 0.9 % IJ SOLN
INTRAMUSCULAR | Status: AC
  Filled 2019-12-20: qty 10

## 2019-12-20 MED ORDER — LACTATED RINGERS IR SOLN
Status: DC | PRN
  Administered 2019-12-20: 1000 mL

## 2019-12-20 MED ORDER — PHENYLEPHRINE 40 MCG/ML (10ML) SYRINGE FOR IV PUSH (FOR BLOOD PRESSURE SUPPORT)
PREFILLED_SYRINGE | INTRAVENOUS | Status: DC | PRN
  Administered 2019-12-20 (×2): 80 ug via INTRAVENOUS
  Administered 2019-12-20: 120 ug via INTRAVENOUS

## 2019-12-20 MED ORDER — PHENYLEPHRINE 40 MCG/ML (10ML) SYRINGE FOR IV PUSH (FOR BLOOD PRESSURE SUPPORT)
PREFILLED_SYRINGE | INTRAVENOUS | Status: AC
  Filled 2019-12-20: qty 10

## 2019-12-20 MED ORDER — METHOCARBAMOL 1000 MG/10ML IJ SOLN
500.0000 mg | Freq: Three times a day (TID) | INTRAVENOUS | Status: DC | PRN
  Filled 2019-12-20: qty 5

## 2019-12-20 MED ORDER — GABAPENTIN 100 MG PO CAPS
200.0000 mg | ORAL_CAPSULE | ORAL | Status: AC
  Administered 2019-12-20: 06:00:00 200 mg via ORAL
  Filled 2019-12-20: qty 2

## 2019-12-20 MED ORDER — ONDANSETRON HCL 4 MG/2ML IJ SOLN
4.0000 mg | Freq: Four times a day (QID) | INTRAMUSCULAR | Status: DC
  Administered 2019-12-20 – 2019-12-21 (×4): 4 mg via INTRAVENOUS
  Filled 2019-12-20 (×4): qty 2

## 2019-12-20 MED ORDER — KCL IN DEXTROSE-NACL 20-5-0.45 MEQ/L-%-% IV SOLN
INTRAVENOUS | Status: DC
  Filled 2019-12-20 (×2): qty 1000

## 2019-12-20 MED ORDER — PANTOPRAZOLE SODIUM 40 MG IV SOLR
40.0000 mg | Freq: Every day | INTRAVENOUS | Status: DC
  Administered 2019-12-20: 40 mg via INTRAVENOUS
  Filled 2019-12-20: qty 40

## 2019-12-20 MED ORDER — ACETAMINOPHEN 500 MG PO TABS
1000.0000 mg | ORAL_TABLET | ORAL | Status: AC
  Administered 2019-12-20: 06:00:00 1000 mg via ORAL
  Filled 2019-12-20: qty 2

## 2019-12-20 MED ORDER — BUPIVACAINE HCL 0.25 % IJ SOLN
INTRAMUSCULAR | Status: AC
  Filled 2019-12-20: qty 1

## 2019-12-20 MED ORDER — ENOXAPARIN SODIUM 40 MG/0.4ML ~~LOC~~ SOLN
40.0000 mg | SUBCUTANEOUS | Status: DC
  Administered 2019-12-21: 40 mg via SUBCUTANEOUS
  Filled 2019-12-20: qty 0.4

## 2019-12-20 MED ORDER — ZOLPIDEM TARTRATE 5 MG PO TABS
5.0000 mg | ORAL_TABLET | Freq: Every evening | ORAL | Status: DC | PRN
  Filled 2019-12-20: qty 1

## 2019-12-20 MED ORDER — FENTANYL CITRATE (PF) 250 MCG/5ML IJ SOLN
INTRAMUSCULAR | Status: AC
  Filled 2019-12-20: qty 5

## 2019-12-20 SURGICAL SUPPLY — 49 items
APPLIER CLIP ROT 10 11.4 M/L (STAPLE) ×3
BENZOIN TINCTURE PRP APPL 2/3 (GAUZE/BANDAGES/DRESSINGS) ×3 IMPLANT
BNDG ADH 1X3 SHEER STRL LF (GAUZE/BANDAGES/DRESSINGS) ×3 IMPLANT
CABLE HIGH FREQUENCY MONO STRZ (ELECTRODE) IMPLANT
CLIP APPLIE ROT 10 11.4 M/L (STAPLE) ×1 IMPLANT
CLOSURE WOUND 1/2 X4 (GAUZE/BANDAGES/DRESSINGS) ×1
COVER WAND RF STERILE (DRAPES) IMPLANT
DEVICE SUT QUICK LOAD TK 5 (STAPLE) ×16 IMPLANT
DEVICE SUT TI-KNOT TK 5X26 (MISCELLANEOUS) ×2 IMPLANT
DEVICE SUTURE ENDOST 10MM (ENDOMECHANICALS) ×3 IMPLANT
DEVICE TI KNOT TK5 (MISCELLANEOUS) ×1
DISSECTOR BLUNT TIP ENDO 5MM (MISCELLANEOUS) ×3 IMPLANT
DRAIN PENROSE 0.5X18 (DRAIN) ×3 IMPLANT
ELECT L-HOOK LAP 45CM DISP (ELECTROSURGICAL) ×3
ELECT REM PT RETURN 15FT ADLT (MISCELLANEOUS) ×3 IMPLANT
ELECTRODE L-HOOK LAP 45CM DISP (ELECTROSURGICAL) ×1 IMPLANT
GLOVE BIO SURGEON STRL SZ7.5 (GLOVE) ×3 IMPLANT
GLOVE BIOGEL PI IND STRL 7.0 (GLOVE) IMPLANT
GLOVE BIOGEL PI INDICATOR 7.0 (GLOVE)
GLOVE INDICATOR 8.0 STRL GRN (GLOVE) ×3 IMPLANT
GOWN STRL REUS W/TWL LRG LVL3 (GOWN DISPOSABLE) ×9 IMPLANT
GOWN STRL REUS W/TWL XL LVL3 (GOWN DISPOSABLE) ×12 IMPLANT
KIT BASIN (CUSTOM PROCEDURE TRAY) ×3 IMPLANT
KIT TURNOVER KIT A (KITS) ×3 IMPLANT
NS IRRIG 1000ML POUR BTL (IV SOLUTION) ×3 IMPLANT
PACK UNIVERSAL I (CUSTOM PROCEDURE TRAY) ×3 IMPLANT
PENCIL SMOKE EVACUATOR (MISCELLANEOUS) IMPLANT
QUICK LOAD TK 5 (STAPLE) ×8
RELOAD ENDO STITCH (ENDOMECHANICALS) ×9 IMPLANT
SCISSORS LAP 5X45 EPIX DISP (ENDOMECHANICALS) ×3 IMPLANT
SET IRRIG TUBING LAPAROSCOPIC (IRRIGATION / IRRIGATOR) ×3 IMPLANT
SET TUBE SMOKE EVAC HIGH FLOW (TUBING) ×3 IMPLANT
SHEARS HARMONIC ACE PLUS 45CM (MISCELLANEOUS) ×3 IMPLANT
SLEEVE XCEL OPT CAN 5 100 (ENDOMECHANICALS) ×9 IMPLANT
STAPLER VISISTAT 35W (STAPLE) IMPLANT
STRIP CLOSURE SKIN 1/2X4 (GAUZE/BANDAGES/DRESSINGS) ×2 IMPLANT
SUT ETHIBOND 2 0 SH (SUTURE) ×6
SUT ETHIBOND 2 0 SH 36X2 (SUTURE) ×3 IMPLANT
SUT SURGIDAC NAB ES-9 0 48 120 (SUTURE) ×15 IMPLANT
SUT VIC AB 4-0 SH 18 (SUTURE) ×3 IMPLANT
TIP INNERVISION DETACH 40FR (MISCELLANEOUS) IMPLANT
TIP INNERVISION DETACH 50FR (MISCELLANEOUS) IMPLANT
TIP INNERVISION DETACH 56FR (MISCELLANEOUS) ×3 IMPLANT
TIPS INNERVISION DETACH 40FR (MISCELLANEOUS)
TOWEL OR NON WOVEN STRL DISP B (DISPOSABLE) IMPLANT
TRAY LAPAROSCOPIC (CUSTOM PROCEDURE TRAY) ×3 IMPLANT
TROCAR BLADELESS OPT 5 100 (ENDOMECHANICALS) ×3 IMPLANT
TROCAR XCEL BLUNT TIP 100MML (ENDOMECHANICALS) IMPLANT
TROCAR XCEL NON-BLD 11X100MML (ENDOMECHANICALS) ×3 IMPLANT

## 2019-12-20 NOTE — Brief Op Note (Signed)
12/20/2019  11:22 AM  PATIENT:  [name]  64 y.o. female  PRE-OPERATIVE DIAGNOSIS:  PARAESOPHAGEAL HERNIA, GERD  POST-OPERATIVE DIAGNOSIS:  PARAESOPHAGEAL HERNIA (type III), GERD  PROCEDURE:  Procedure(s): LAPAROSCOPIC PARAESOPHAGEAL HIATAL HERNIA REPAIR with NISSEN FUNDOPLICATION, UPPER ENDO (N/A); laparoscopic bilateral TAP block  SURGEON:  Surgeon(s) and Role:    Greer Pickerel, MD - Primary    * Ralene Ok, MD - Assisting  PHYSICIAN ASSISTANT:   ASSISTANTS: see above   ANESTHESIA:   general  EBL:  50 mL   BLOOD ADMINISTERED:none  DRAINS: none   LOCAL MEDICATIONS USED:  MARCAINE    and OTHER exparel   SPECIMEN:  Source of Specimen:  hernia sac  DISPOSITION OF SPECIMEN:  discarded  COUNTS:  YES  TOURNIQUET:  * No tourniquets in log *  DICTATION: .Other Dictation: Dictation Number [id number]  PLAN OF CARE: Admit for overnight observation  PATIENT DISPOSITION:  PACU - hemodynamically stable.   Delay start of Pharmacological VTE agent (>24hrs) due to surgical blood loss or risk of bleeding: no  Leighton Ruff. Redmond Pulling, MD, FACS General, Bariatric, & Minimally Invasive Surgery [facility], [place]

## 2019-12-20 NOTE — Transfer of Care (Signed)
Immediate Anesthesia Transfer of Care Note  Patient: Hailey Galvan  Procedure(s) Performed: LAPAROSCOPIC PARAESOPHAGEAL HIATAL HERNIA REPAIR, POSSIBLE FUNDOPLICATION, UPPER ENDO (N/A Abdomen)  Patient Location: PACU  Anesthesia Type:General  Level of Consciousness: awake, alert , oriented and patient cooperative  Airway & Oxygen Therapy: Patient Spontanous Breathing and Patient connected to face mask oxygen  Post-op Assessment: Report given to RN and Post -op Vital signs reviewed and stable  Post vital signs: Reviewed and stable  Last Vitals:  Vitals Value Taken Time  BP  12/20/19 1047  Temp    Pulse 103 12/20/19 1048  Resp 15 12/20/19 1048  SpO2 93 % 12/20/19 1048  Vitals shown include unvalidated device data.  Last Pain:  Vitals:   12/20/19 0600  TempSrc:   PainSc: 0-No pain         Complications: No apparent anesthesia complications

## 2019-12-20 NOTE — Anesthesia Postprocedure Evaluation (Signed)
Anesthesia Post Note  Patient: Hailey Galvan  Procedure(s) Performed: LAPAROSCOPIC PARAESOPHAGEAL HIATAL HERNIA REPAIR, POSSIBLE FUNDOPLICATION, UPPER ENDO (N/A Abdomen)     Patient location during evaluation: PACU Anesthesia Type: General Level of consciousness: awake and alert, oriented and patient cooperative Pain management: pain level controlled Vital Signs Assessment: post-procedure vital signs reviewed and stable Respiratory status: spontaneous breathing, nonlabored ventilation, respiratory function stable and patient connected to nasal cannula oxygen Cardiovascular status: blood pressure returned to baseline and stable Postop Assessment: no apparent nausea or vomiting Anesthetic complications: no    Last Vitals:  Vitals:   12/20/19 1340 12/20/19 1521  BP: (!) 149/97 136/88  Pulse: 92 86  Resp: 16 17  Temp: 36.7 C 37 C  SpO2: 92% 95%    Last Pain:  Vitals:   12/20/19 1521  TempSrc: Oral  PainSc:                  Renne Cornick,E. Chuong Casebeer

## 2019-12-20 NOTE — Discharge Instructions (Signed)
EATING AFTER YOUR ESOPHAGEAL SURGERY (Stomach Fundoplication, Hiatal Hernia repair, Achalasia surgery, etc)  ######################################################################  EAT Start with a pureed / full liquid diet (see below) Gradually transition to a high fiber diet with a fiber supplement over the next month after discharge.    WALK Walk an hour a day.  Control your pain to do that.    CONTROL PAIN Control pain so that you can walk, sleep, tolerate sneezing/coughing, go up/down stairs.  HAVE A BOWEL MOVEMENT DAILY Keep your bowels regular to avoid problems.  OK to try a laxative to override constipation.  OK to use an antidairrheal to slow down diarrhea.  Call if not better after 2 tries  CALL IF YOU HAVE PROBLEMS/CONCERNS Call if you are still struggling despite following these instructions. Call if you have concerns not answered by these instructions  ######################################################################   After your esophageal surgery, expect some sticking with swallowing over the next 1-2 months.    If food sticks when you eat, it is called "dysphagia".  This is due to swelling around your esophagus at the wrap & hiatal diaphragm repair.  It will gradually ease off over the next few months.  To help you through this temporary phase, we start you out on a pureed (blenderized) diet.  Your first meal in the hospital was thin liquids.  You should have been given a pureed diet by the time you left the hospital.  We ask patients to stay on a pureed diet for the first 2-3 weeks to avoid anything getting "stuck" near your recent surgery.  Don't be alarmed if your ability to swallow doesn't progress according to this plan.  Everyone is different and some diets can advance more or less quickly.    It is often helpful to crush your medications or split them as they can sometimes stick, especially the first week or so.   Some BASIC RULES to follow  are:  Maintain an upright position whenever eating or drinking.  Take small bites - just a teaspoon size bite at a time.  Eat slowly.  It may also help to eat only one food at a time.  Consider nibbling through smaller, more frequent meals & avoid the urge to eat BIG meals  Do not push through feelings of fullness, nausea, or bloatedness  Do not mix solid foods and liquids in the same mouthful  Try not to "wash foods down" with large gulps of liquids.  Avoid carbonated (bubbly/fizzy) drinks.    Avoid foods that make you feel gassy or bloated.  Start with bland foods first.  Wait on trying greasy, fried, or spicy meals until you are tolerating more bland solids well.  Understand that it will be hard to burp and belch at first.  This gradually improves with time.  Expect to be more gassy/flatulent/bloated initially.  Walking will help your body manage it better.  Consider using medications for bloating that contain simethicone such as  Maalox or Gas-X   Consider crushing her medications, especially smaller pills.  The ability to swallow pills should get easier after a few weeks  Eat in a relaxed atmosphere & minimize distractions.  Avoid talking while eating.    Do not use straws.  Following each meal, sit in an upright position (90 degree angle) for 60 to 90 minutes.  Going for a short walk can help as well  If food does stick, don't panic.  Try to relax and let the food pass on its own.  Sipping WARM LIQUID such as strong hot black tea can also help slide it down.   Be gradual in changes & use common sense:  -If you easily tolerating a certain "level" of foods, advance to the next level gradually -If you are having trouble swallowing a particular food, then avoid it.   -If food is sticking when you advance your diet, go back to thinner previous diet (the lower LEVEL) for 1-2 days.  LEVEL 1 = PUREED DIET  Do for the first 2 WEEKS AFTER SURGERY  -Foods in this group are  pureed or blenderized to a smooth, mashed potato-like consistency.  -If necessary, the pureed foods can keep their shape with the addition of a thickening agent.   -Meat should be pureed to a smooth, pasty consistency.  Hot broth or gravy may be added to the pureed meat, approximately 1 oz. of liquid per 3 oz. serving of meat. -CAUTION:  If any foods do not puree into a smooth consistency, swallowing will be more difficult.  (For example, nuts or seeds sometimes do not blend well.)  Hot Foods Cold Foods  Pureed scrambled eggs and cheese Pureed cottage cheese  Baby cereals Thickened juices and nectars  Thinned cooked cereals (no lumps) Thickened milk or eggnog  Pureed [place] toast or pancakes Ensure  Mashed potatoes Ice cream  Pureed parsley, au gratin, scalloped potatoes, candied sweet potatoes Fruit or [place] ice, sherbet  Pureed buttered or alfredo noodles Plain yogurt  Pureed vegetables (no corn or peas) Instant breakfast  Pureed soups and creamed soups Smooth pudding, mousse, custard  Pureed scalloped apples Whipped gelatin  Gravies Sugar, syrup, honey, jelly  Sauces, cheese, tomato, barbecue, white, creamed Cream  Any baby food Creamer  Alcohol in moderation (not beer or champagne) Margarine  Coffee or tea Mayonnaise   Ketchup, mustard   Apple sauce   SAMPLE MENU:  PUREED DIET Breakfast Lunch Dinner   Orange juice, 1/2 cup  Cream of wheat, 1/2 cup  Pineapple juice, 1/2 cup  Pureed [place], barley soup, 3/4 cup  Pureed Hawaiian chicken, 3 oz   Scrambled eggs, mashed or blended with cheese, 1/2 cup  Tea or coffee, 1 cup   Whole milk, 1 cup   Non-dairy creamer, 2 Tbsp.  Mashed potatoes, 1/2 cup  Pureed cooled broccoli, 1/2 cup  Apple sauce, 1/2 cup  Coffee or tea  Mashed potatoes, 1/2 cup  Pureed spinach, 1/2 cup  Frozen yogurt, 1/2 cup  Tea or coffee      LEVEL 2 = SOFT DIET  After your first 2 weeks, you can advance to a soft diet.   Keep on this  diet until everything goes down easily.  Hot Foods Cold Foods  White fish Cottage cheese  Stuffed fish Junior baby fruit  Baby food meals Semi thickened juices  Minced soft cooked, scrambled, poached eggs nectars  Souffle & omelets Ripe mashed bananas  Cooked cereals Canned fruit, pineapple sauce, milk  potatoes Milkshake  Buttered or Alfredo noodles Custard  Cooked cooled vegetable Puddings, including tapioca  Sherbet Yogurt  Vegetable soup or alphabet soup Fruit ice, [place] ice  Gravies Whipped gelatin  Sugar, syrup, honey, jelly Junior baby desserts  Sauces:  Cheese, creamed, barbecue, tomato, white Cream  Coffee or tea Margarine   SAMPLE MENU:  LEVEL 2 Breakfast Lunch Dinner   Orange juice, 1/2 cup  Oatmeal, 1/2 cup  Scrambled eggs with cheese, 1/2 cup  Decaffeinated tea, 1 cup  Whole milk, 1 cup  Non-dairy creamer, 2 Tbsp  Pineapple juice, 1/2 cup  Minced beef, 3 oz  Gravy, 2 Tbsp  Mashed potatoes, 1/2 cup  Minced fresh broccoli, 1/2 cup  Applesauce, 1/2 cup  Coffee, 1 cup  [place], barley soup, 3/4 cup  Minced Hawaiian chicken, 3 oz  Mashed potatoes, 1/2 cup  Cooked spinach, 1/2 cup  Frozen yogurt, 1/2 cup  Non-dairy creamer, 2 Tbsp      LEVEL 3 = CHOPPED DIET  -After all the foods in level 2 (soft diet) are passing through well you should advance up to more chopped foods.  -It is still important to cut these foods into small pieces and eat slowly.  Hot Foods Cold Foods  Poultry Cottage cheese  Chopped Swedish meatballs Yogurt  Meat salads (ground or flaked meat) Milk  Flaked fish (tuna) Milkshakes  Poached or scrambled eggs Soft, cold, dry cereal  Souffles and omelets Fruit juices or nectars  Cooked cereals Chopped canned fruit  Chopped [place] toast or pancakes Canned fruit cocktail  Noodles or pasta (no rice) Pudding, mousse, custard  Cooked vegetables (no frozen peas, corn, or mixed vegetables) Green salad  Canned small sweet peas  Ice cream  Creamed soup or vegetable soup Fruit ice, [place] ice  Pureed vegetable soup or alphabet soup Non-dairy creamer  Ground scalloped apples Margarine  Gravies Mayonnaise  Sauces:  Cheese, creamed, barbecue, tomato, white Ketchup  Coffee or tea Mustard   SAMPLE MENU:  LEVEL 3 Breakfast Lunch Dinner   Orange juice, 1/2 cup  Oatmeal, 1/2 cup  Scrambled eggs with cheese, 1/2 cup  Decaffeinated tea, 1 cup  Whole milk, 1 cup  Non-dairy creamer, 2 Tbsp  Ketchup, 1 Tbsp  Margarine, 1 tsp  Salt, 1/4 tsp  Sugar, 2 tsp  Pineapple juice, 1/2 cup  Ground beef, 3 oz  Gravy, 2 Tbsp  Mashed potatoes, 1/2 cup  Cooked spinach, 1/2 cup  Applesauce, 1/2 cup  Decaffeinated coffee  Whole milk  Non-dairy creamer, 2 Tbsp  Margarine, 1 tsp  Salt, 1/4 tsp  Pureed [place], barley soup, 3/4 cup  Barbecue chicken, 3 oz  Mashed potatoes, 1/2 cup  Ground fresh broccoli, 1/2 cup  Frozen yogurt, 1/2 cup  Decaffeinated tea, 1 cup  Non-dairy creamer, 2 Tbsp  Margarine, 1 tsp  Salt, 1/4 tsp  Sugar, 1 tsp    LEVEL 4:  REGULAR FOODS  -Foods in this group are soft, moist, regularly textured foods.   -This level includes meat and breads, which tend to be the hardest things to swallow.   -Eat very slowly, chew well and continue to avoid carbonated drinks. -most people are at this level in 4-6 weeks  Hot Foods Cold Foods  Baked fish or skinned Soft cheeses - cottage cheese  Souffles and omelets Cream cheese  Eggs Yogurt  Stuffed shells Milk  Spaghetti with meat sauce Milkshakes  Cooked cereal Cold dry cereals (no nuts, dried fruit, coconut)  [place] toast or pancakes Crackers  Buttered toast Fruit juices or nectars  Noodles or pasta (no rice) Canned fruit  Potatoes (all types) Ripe bananas  Soft, cooked vegetables (no corn, lima, or baked beans) Peeled, ripe, fresh fruit  Creamed soups or vegetable soup Cakes (no nuts, dried fruit, coconut)  Canned chicken  noodle soup Plain doughnuts  Gravies Ice cream  Bacon dressing Pudding, mousse, custard  Sauces:  Cheese, creamed, barbecue, tomato, white Fruit ice, [place] ice, sherbet  Decaffeinated tea or coffee Whipped gelatin  Pork chops Regular gelatin  Canned fruited gelatin molds   Sugar, syrup, honey, jam, jelly   Cream   Non-dairy   Margarine   Oil   Mayonnaise   Ketchup   Mustard   TROUBLESHOOTING IRREGULAR BOWELS  1) Avoid extremes of bowel movements (no bad constipation/diarrhea)  2) Miralax 17gm mixed in 8oz. water or juice-daily. May use BID as needed.  3) Gas-x,Phazyme, etc. as needed for gas & bloating.  4) Soft,bland diet. No spicy,greasy,fried foods.  5) Prilosec over-the-counter as needed  6) May hold gluten/wheat products from diet to see if symptoms improve.  7) May try probiotics (Align, Activa, etc) to help calm the bowels down  7) If symptoms become worse call back immediately.    If you have any questions please call our office at [place]: [phone].  ........Marland Kitchen   Managing Your Pain After Surgery Without Opioids    Thank you for participating in our program to help patients manage their pain after surgery without opioids. This is part of our effort to provide you with the best care possible, without exposing you or your family to the risk that opioids pose.  What pain can I expect after surgery? You can expect to have some pain after surgery. This is normal. The pain is typically worse the day after surgery, and quickly begins to get better. Many studies have found that many patients are able to manage their pain after surgery with Over-the-Counter (OTC) medications such as Tylenol and Motrin. If you have a condition that does not allow you to take Tylenol or Motrin, notify your surgical team.  How will I manage my pain? The best strategy for controlling your pain after surgery is around the clock pain control with Tylenol (acetaminophen) and  Motrin (ibuprofen or Advil). Alternating these medications with each other allows you to maximize your pain control. In addition to Tylenol and Motrin, you can use heating pads or ice packs on your incisions to help reduce your pain.  How will I alternate your regular strength over-the-counter pain medication? You will take a dose of pain medication every three hours. ; Start by taking 650 mg of Tylenol (2 pills of 325 mg) ; 3 hours later take 600 mg of Motrin (3 pills of 200 mg) ; 3 hours after taking the Motrin take 650 mg of Tylenol ; 3 hours after that take 600 mg of Motrin.   - 1 -  See example - if your first dose of Tylenol is at 12:00 PM   12:00 PM Tylenol 650 mg (2 pills of 325 mg)  3:00 PM Motrin 600 mg (3 pills of 200 mg)  6:00 PM Tylenol 650 mg (2 pills of 325 mg)  9:00 PM Motrin 600 mg (3 pills of 200 mg)  Continue alternating every 3 hours   We recommend that you follow this schedule around-the-clock for at least 3 days after surgery, or until you feel that it is no longer needed. Use the table on the last page of this handout to keep track of the medications you are taking. Important: Do not take more than 3027m of Tylenol or 18035mof Motrin in a 24-hour period. Do not take ibuprofen/Motrin if you have a history of bleeding stomach ulcers, severe kidney disease, &/or actively taking a blood thinner  What if I still have pain? If you have pain that is not controlled with the over-the-counter pain medications (Tylenol and Motrin or Advil) you might have what we call "breakthrough" pain. You will  receive a prescription for a small amount of an opioid pain medication such as Oxycodone, Tramadol, or Tylenol with Codeine. Use these opioid pills in the first 24 hours after surgery if you have breakthrough pain. Do not take more than 1 pill every 4-6 hours.  If you still have uncontrolled pain after using all opioid pills, don't hesitate to call our staff using the number  provided. We will help make sure you are managing your pain in the best way possible, and if necessary, we can provide a prescription for additional pain medication.   Day 1    Time  Name of Medication Number of pills taken  Amount of Acetaminophen  Pain Level   Comments  AM PM       AM PM       AM PM       AM PM       AM PM       AM PM       AM PM       AM PM       Total Daily amount of Acetaminophen Do not take more than  3,000 mg per day      Day 2    Time  Name of Medication Number of pills taken  Amount of Acetaminophen  Pain Level   Comments  AM PM       AM PM       AM PM       AM PM       AM PM       AM PM       AM PM       AM PM       Total Daily amount of Acetaminophen Do not take more than  3,000 mg per day      Day 3    Time  Name of Medication Number of pills taken  Amount of Acetaminophen  Pain Level   Comments  AM PM       AM PM       AM PM       AM PM          AM PM       AM PM       AM PM       AM PM       Total Daily amount of Acetaminophen Do not take more than  3,000 mg per day      Day 4    Time  Name of Medication Number of pills taken  Amount of Acetaminophen  Pain Level   Comments  AM PM       AM PM       AM PM       AM PM       AM PM       AM PM       AM PM       AM PM       Total Daily amount of Acetaminophen Do not take more than  3,000 mg per day      Day 5    Time  Name of Medication Number of pills taken  Amount of Acetaminophen  Pain Level   Comments  AM PM       AM PM       AM PM       AM PM       AM PM  AM PM       AM PM       AM PM       Total Daily amount of Acetaminophen Do not take more than  3,000 mg per day       Day 6    Time  Name of Medication Number of pills taken  Amount of Acetaminophen  Pain Level  Comments  AM PM       AM PM       AM PM       AM PM       AM PM       AM PM       AM PM       AM PM       Total Daily amount of Acetaminophen Do  not take more than  3,000 mg per day      Day 7    Time  Name of Medication Number of pills taken  Amount of Acetaminophen  Pain Level   Comments  AM PM       AM PM       AM PM       AM PM       AM PM       AM PM       AM PM       AM PM       Total Daily amount of Acetaminophen Do not take more than  3,000 mg per day        For additional information about how and where to safely dispose of unused opioid medications - [employer]  Disclaimer: This document contains information and/or instructional materials adapted from [facility] for the typical patient with your condition. It does not replace medical advice from your health care provider because your experience may differ from that of the typical patient. Talk to your health care provider if you have any questions about this document, your condition or your treatment plan. Adapted from [facility]

## 2019-12-20 NOTE — Anesthesia Procedure Notes (Signed)
Procedure Name: Intubation Date/Time: 12/20/2019 7:35 AM Performed by: Maxwell Caul, CRNA Pre-anesthesia Checklist: Patient identified, Emergency Drugs available, Suction available and Patient being monitored Patient Re-evaluated:Patient Re-evaluated prior to induction Oxygen Delivery Method: Circle system utilized Preoxygenation: Pre-oxygenation with 100% oxygen Induction Type: IV induction, Rapid sequence and Cricoid Pressure applied Laryngoscope Size: Mac and 4 Grade View: Grade I Tube type: Oral Tube size: 7.5 mm Number of attempts: 1 Airway Equipment and Method: Stylet Placement Confirmation: ETT inserted through vocal cords under direct vision,  positive ETCO2 and breath sounds checked- equal and bilateral Secured at: 21 cm Tube secured with: Tape Dental Injury: Teeth and Oropharynx as per pre-operative assessment

## 2019-12-20 NOTE — H&P (Signed)
Hailey Galvan is an 64 y.o. female.   Chief Complaint: here for surgery HPI: 64 yo female here for [place] surgery. Denies medical changes since seen in clinic. No trips to ed/hospital. crohns under control lately.   The patient is a 64 year old female who presents with a hiatal hernia. She is referred by Dr Havery Moros for evaluation of a paraesophageal hernia. She saw Dr. Kae Heller last summer. She states that her heartburn symptoms worsened over the past year or 2. She states that she has a known paraesophageal hernia. She states over the past years her reflux has worsened. She describes a sensation of acid squirting into her mouth. She states that she was recently placed on dexilant and she has had good control with her heartburn and acid sensation however she is still having burping and what she describes as regurgitation. She describes it as a tiny bit of throw up and taste in the back of her mouth - but no solid food. She states that she has lots of gas and burping. She also has had recent issues with large amounts of flatulence. She denies any early satiety. She denies any nighttime cough. She has not had prior pneumonia episodes. Only prior abdominal surgery has been C-sections and hysterectomy. She does not smoke. She drinks alcohol socially. She denies any cardiac symptoms such as chest pain, chest pressure, source of breath, dyspnea, TIAs or amaurosis fugax. No prior blood clots.  She does also have lower GI issues and has a presumed diagnosis of Crohn's. She has had chronic radiological and endoscopic findings of probable Crohn's in her terminal ileum and and another segment of her small bowel. She has been on prednisone in the past but had no improvement in her symptoms. She states that her inflammatory bowel symptoms aren't stable right now in its more of her hiatal hernia symptoms that are bothering her.  She states that she had an attempted manometry a few years ago at [facility]. I  reviewed this and she could not tolerate it and it was removed within 20 minutes. They could not get it positioned due to her paraesophageal hernia.  I reviewed her referring gastroenterologist office note from May 2020, October 2020, colonoscopy from December 2020 which showed multiple small erosions and one superficial ulcer in the terminal ileum benign pathology,; labs from October 2020, I reviewed her MRI enterography from last summer with the description below. I also reviewed her CT scan from 2019 as well as a prior colonoscopy report from do that showed active ileitis with ulcer.  Prior workup as outlined: Previously followed by Dr. Darlin Coco, [facility]. Negative stool studies for infection CBC normal, LFTs normal, TSH normal ESR 16, CRP 0.53, fecal calprotectin 183 (H)  Barium swallow - reflux noted, paraesophageal hernia, mild dysmotility Esophageal manometry indeterminate Colonoscopy 03/30/2018 - one small adenoma, ileum with multiple non-bleeding erosions, multiple small mouthed diverticuli in sigmoid colon, 65m adenoma sigmoid colon - path shows "active ileitis with ulceration" - no evidence of chronicity or granulomas, nonspecific. Normal biopsies of the colon. CT enterography 03/06/2018 - moderate paraesophageal hernia, fatty infiltration of the TI, normal small bowel and colon otherwise CT scan 01/03/18 - fluid filled but nonenlarged ileum, mild terminal ileitis, paraesophageal hiatal hernia CT scan 01/01/18 - normal bowel, fatty liver, fatty infiltration of TI, paraesophageal hernia EGD 10/09/17 - normal duodenal biopsies, normal gastric biopsies, negative for HP, esophageal biopsies with esophagitis and hyperkeratosis [facility] 09/01/2017 - mild increase echogenicity of the wall of  gallbladder wall, otherwise normal Colonoscopy 10/09/17 - normal TI, mild diverticulosis, 44m adenoma sigmoid colon HIDA scan 09/22/2017 - normal, EF 69.4%  Fecal calprotectin 01/18/19 - 243  MRE 02/06/19 -  IMPRESSION: 1. Relatively subtle accentuated enhancement in the terminal ileum, and in a separate transverse nondilated loop of small bowel in the central abdomen. The appearance favors low-grade active Crohn's disease. 2. Periampullary and transverse duodenal diverticula. 3. Moderate-sized type 3 hiatal hernia.   Past Medical History:  Diagnosis Date  . Allergy   . Crohn disease ([place]   . Depression .  .Marland KitchenHiatal hernia   . Ileitis     Past Surgical History:  Procedure Laterality Date  . ABDOMINAL HYSTERECTOMY    . BREAST EXCISIONAL BIOPSY Right   . BREAST SURGERY     Tumor Removal  . CESAREAN SECTION    . COLONOSCOPY  last 03/30/2018   at [facility]=polyps  . KNEE SURGERY    . TONSILLECTOMY      Family History  Problem Relation Age of Onset  . Alcohol abuse Mother   . Stroke Mother   . Diabetes Father   . Depression Paternal Grandmother   . Colon cancer Neg Hx   . Esophageal cancer Neg Hx   . Rectal cancer Neg Hx   . Stomach cancer Neg Hx   . Colon polyps Neg Hx    Social History:  reports that she has been smoking cigarettes. She has a 1.25 pack-year smoking history. She has never used smokeless tobacco. She reports current alcohol use of about 14.0 standard drinks of alcohol per week. She reports that she does not use drugs.  Allergies:  Allergies  Allergen Reactions  . Etodolac Other (See Comments)    Sleepiness, feeling "out of it" Sleepiness, feeling "out of it"   . Naproxen Other (See Comments)    "flu like symptoms"  . Zanaflex [Tizanidine Hcl] Other (See Comments)    Hallucinations.    Facility-Administered Medications Prior to Admission  Medication Dose Route Frequency Provider Last Rate Last Admin  . 0.9 %  sodium chloride infusion  500 mL Intravenous Once Armbruster, SCarlota Raspberry MD       Medications Prior to Admission  Medication Sig Dispense Refill  . ADDERALL XR 10 MG 24 hr capsule Take 20 mg by mouth daily.    .Marland Kitchenatorvastatin (LIPITOR) 10 MG tablet  TAKE 1 TABLET BY MOUTH ONCE DAILY AT  6  P.M. **PATIENT NEEDS APT FOR FURTHER REFILLS** (Patient taking differently: Take 10 mg by mouth daily. TAKE 1 TABLET BY MOUTH ONCE DAILY AT  6  P.M. **PATIENT NEEDS APT FOR FURTHER REFILLS**) 30 tablet 0  . cyclobenzaprine (FLEXERIL) 10 MG tablet Take 1 tablet (10 mg total) by mouth at bedtime. (Patient taking differently: Take 10 mg by mouth at bedtime as needed (sleep). ) 90 tablet 1  . DULoxetine (CYMBALTA) 60 MG capsule Take 1 capsule (60 mg total) by mouth daily. 90 capsule 3  . omeprazole (PRILOSEC) 40 MG capsule Take 40 mg by mouth 2 (two) times daily.    . sucralfate (CARAFATE) 1 GM/10ML suspension Take 10 mLs (1 g total) by mouth every 6 (six) hours as needed. (Patient taking differently: Take 1 g by mouth in the morning, at noon, and at bedtime. ) 420 mL 1  . vitamin B-12 (CYANOCOBALAMIN) 1000 MCG tablet Take 1,000 mcg by mouth daily.    . butalbital-acetaminophen-caffeine (FIORICET) 50-325-40 MG tablet TAKE 1 TABLET BY MOUTH EVERY 6 HOURS AS  NEEDED (Patient taking differently: Take 1 tablet by mouth every 6 (six) hours as needed for headache. ) 14 tablet 0  . dexlansoprazole (DEXILANT) 60 MG capsule Take 1 capsule (60 mg total) by mouth daily. (Patient not taking: Reported on 12/03/2019) 30 capsule 3  . fluconazole (DIFLUCAN) 200 MG tablet Take one dose by mouth, wait 72 hours, and then take second dose by mouth (Patient not taking: Reported on 12/03/2019) 2 tablet 0  . gabapentin (NEURONTIN) 600 MG tablet Take 1 tablet (600 mg total) by mouth daily. (Patient not taking: Reported on 12/03/2019) 30 tablet 3  . nitrofurantoin, macrocrystal-monohydrate, (MACROBID) 100 MG capsule Take 1 capsule (100 mg total) by mouth 2 (two) times daily. (Patient not taking: Reported on 12/03/2019) 10 capsule 0  . pantoprazole (PROTONIX) 40 MG tablet Take 1 tablet (40 mg total) by mouth 2 (two) times daily. (Patient not taking: Reported on 12/03/2019) 180 tablet 1  . traMADol  (ULTRAM) 50 MG tablet Take 1 tablet (50 mg total) by mouth every 8 (eight) hours as needed. (Patient not taking: Reported on 12/03/2019) 90 tablet 0    No results found for this or any previous visit (from the past 48 hour(s)). No results found.  Review of Systems  All other systems reviewed and are negative.   Blood pressure 122/88, pulse 73, temperature 98.1 F (36.7 C), temperature source Oral, resp. rate 18, height _0  (1.626 m), weight 70.5 kg, SpO2 100 %. Physical Exam  Vitals reviewed. Constitutional: She is oriented to person, place, and time. She appears well-developed and well-nourished. No distress.  HENT:  Head: Normocephalic and atraumatic.  Right Ear: External ear normal.  Left Ear: External ear normal.  Eyes: Conjunctivae are normal. No scleral icterus.  Neck: No tracheal deviation present. No thyromegaly present.  Cardiovascular: Normal rate and normal heart sounds.  Respiratory: Effort normal and breath sounds normal. No stridor. No respiratory distress. She has no wheezes.  GI: Soft. She exhibits no distension. There is no abdominal tenderness. There is no rebound.  Musculoskeletal:        General: No tenderness or edema.     Cervical back: Normal range of motion and neck supple.  Lymphadenopathy:    She has no cervical adenopathy.  Neurological: She is alert and oriented to person, place, and time. She exhibits normal muscle tone.  Skin: Skin is warm and dry. No rash noted. She is not diaphoretic. No erythema. No pallor.  Psychiatric: She has a normal mood and affect. Her behavior is normal. Judgment and thought content normal.     Assessment/Plan PEH crohns  To OR for lap PEH repair, with fundoplication ERAS IV abx All questions asked and answered.   Leighton Ruff. Redmond Pulling, MD, FACS General, Bariatric, & Minimally Invasive Surgery [facility], [place]   Greer Pickerel, MD 12/20/2019, 7:22 AM

## 2019-12-21 ENCOUNTER — Observation Stay (HOSPITAL_COMMUNITY): Payer: 59

## 2019-12-21 DIAGNOSIS — K219 Gastro-esophageal reflux disease without esophagitis: Secondary | ICD-10-CM | POA: Diagnosis not present

## 2019-12-21 LAB — CBC
HCT: 35.5 % — ABNORMAL LOW (ref 36.0–46.0)
Hemoglobin: 11.3 g/dL — ABNORMAL LOW (ref 12.0–15.0)
MCH: 30.1 pg (ref 26.0–34.0)
MCHC: 31.8 g/dL (ref 30.0–36.0)
MCV: 94.7 fL (ref 80.0–100.0)
Platelets: 286 10*3/uL (ref 150–400)
RBC: 3.75 MIL/uL — ABNORMAL LOW (ref 3.87–5.11)
RDW: 13.1 % (ref 11.5–15.5)
WBC: 11.7 10*3/uL — ABNORMAL HIGH (ref 4.0–10.5)
nRBC: 0 % (ref 0.0–0.2)

## 2019-12-21 LAB — BASIC METABOLIC PANEL
Anion gap: 8 (ref 5–15)
BUN: 11 mg/dL (ref 8–23)
CO2: 26 mmol/L (ref 22–32)
Calcium: 9 mg/dL (ref 8.9–10.3)
Chloride: 103 mmol/L (ref 98–111)
Creatinine, Ser: 0.76 mg/dL (ref 0.44–1.00)
GFR calc Af Amer: >60 mL/min (ref >60)
GFR calc non Af Amer: >60 mL/min (ref >60)
Glucose, Bld: 114 mg/dL — ABNORMAL HIGH (ref 70–99)
Potassium: 4.1 mmol/L (ref 3.5–5.1)
Sodium: 137 mmol/L (ref 135–145)

## 2019-12-21 MED ORDER — SODIUM CHLORIDE 0.9% FLUSH: 3.0000 mL | INTRAVENOUS | Status: DC | PRN

## 2019-12-21 MED ORDER — ACETAMINOPHEN 325 MG PO TABS
650.0000 mg | ORAL_TABLET | Freq: Four times a day (QID) | ORAL | Status: DC
  Administered 2019-12-21: 650 mg via ORAL
  Filled 2019-12-21: qty 2

## 2019-12-21 MED ORDER — GUAIFENESIN-DM 100-10 MG/5ML PO SYRP: 10.0000 mL | ORAL_SOLUTION | ORAL | Status: DC | PRN

## 2019-12-21 MED ORDER — PROMETHAZINE HCL 12.5 MG RE SUPP
12.5000 mg | Freq: Four times a day (QID) | RECTAL | Status: DC | PRN
  Filled 2019-12-21: qty 1

## 2019-12-21 MED ORDER — LACTATED RINGERS IV BOLUS: 1000.0000 mL | Freq: Three times a day (TID) | INTRAVENOUS | Status: DC | PRN

## 2019-12-21 MED ORDER — ALUM & MAG HYDROXIDE-SIMETH 200-200-20 MG/5ML PO SUSP: 30.0000 mL | Freq: Four times a day (QID) | ORAL | Status: DC | PRN

## 2019-12-21 MED ORDER — MENTHOL 3 MG MT LOZG: 1.0000 | LOZENGE | OROMUCOSAL | Status: DC | PRN

## 2019-12-21 MED ORDER — SODIUM CHLORIDE 0.9 % IV SOLN: 250.0000 mL | INTRAVENOUS | Status: DC | PRN

## 2019-12-21 MED ORDER — OXYCODONE HCL 5 MG PO TABS: 5.0000 mg | ORAL_TABLET | ORAL | 0 refills | Status: DC | PRN

## 2019-12-21 MED ORDER — GABAPENTIN 250 MG/5ML PO SOLN
300.0000 mg | Freq: Three times a day (TID) | ORAL | Status: DC
  Administered 2019-12-21: 300 mg via ORAL
  Filled 2019-12-21: qty 6

## 2019-12-21 MED ORDER — HYDROCORTISONE 1 % EX CREA
1.0000 "application " | TOPICAL_CREAM | Freq: Three times a day (TID) | CUTANEOUS | Status: DC | PRN
  Filled 2019-12-21: qty 28

## 2019-12-21 MED ORDER — LIP MEDEX EX OINT
1.0000 "application " | TOPICAL_OINTMENT | Freq: Two times a day (BID) | CUTANEOUS | Status: DC
  Administered 2019-12-21: 1 via TOPICAL
  Filled 2019-12-21: qty 7

## 2019-12-21 MED ORDER — MAGIC MOUTHWASH
15.0000 mL | Freq: Four times a day (QID) | ORAL | Status: DC | PRN
  Filled 2019-12-21: qty 15

## 2019-12-21 MED ORDER — HYDROCORTISONE (PERIANAL) 2.5 % EX CREA
1.0000 "application " | TOPICAL_CREAM | Freq: Four times a day (QID) | CUTANEOUS | Status: DC | PRN
  Filled 2019-12-21: qty 28.35

## 2019-12-21 MED ORDER — SODIUM CHLORIDE 0.9% FLUSH: 3.0000 mL | Freq: Two times a day (BID) | INTRAVENOUS | Status: DC

## 2019-12-21 MED ORDER — METOCLOPRAMIDE HCL 5 MG PO TABS: 5.0000 mg | ORAL_TABLET | Freq: Three times a day (TID) | ORAL | Status: DC | PRN

## 2019-12-21 MED ORDER — PHENOL 1.4 % MT LIQD
2.0000 | OROMUCOSAL | Status: DC | PRN
  Filled 2019-12-21: qty 177

## 2019-12-21 MED ORDER — LACTATED RINGERS IV BOLUS: 1000.0000 mL | Freq: Once | INTRAVENOUS | Status: DC

## 2019-12-21 MED ORDER — PANTOPRAZOLE SODIUM 40 MG PO TBEC
40.0000 mg | DELAYED_RELEASE_TABLET | Freq: Every day | ORAL | Status: DC
  Administered 2019-12-21: 40 mg via ORAL
  Filled 2019-12-21: qty 1

## 2019-12-21 NOTE — Progress Notes (Signed)
Pt insisted on going home. MD called for d/c orders.

## 2019-12-21 NOTE — Progress Notes (Signed)
Hailey Galvan [id number] Nov 02, 1955  CARE TEAM:  PCP: Yetta Flock, MD  Outpatient Care Team: Patient Care Team: Armbruster, [name], MD as PCP - General (Gastroenterology) Deno Etienne, MD as Consulting Physician (Anesthesiology) Peri Maris, MD as Referring Physician (Student)  Inpatient Treatment Team: Treatment Team: Attending Provider: Greer Pickerel, MD; Registered Nurse: Kai Levins, RN   Problem List:   Principal Problem:   S/P repair of paraesophageal hernia Active Problems:   Hyperlipidemia   Neuralgia and neuritis, unspecified   Urge incontinence   Crohn's disease ([place])   1 Day Post-Op  12/20/2019  PROCEDURE:  LAPAROSCOPIC PARAESOPHAGEAL HIATAL HERNIA REPAIR with NISSEN FUNDOPLICATION UPPER ENDOSCOPY Laparoscopic bilateral TAP block  SURGEON:      Greer Pickerel, MD - Primary    * Ralene Ok, MD - Assisting  Assessment  Recovering  [facility] Stay = 0 days)  Plan:  -UGI this AM -if no obstruction or leak, perhaps transition to pured diet and discharge this weekend -IV fluid bolus and then Medlock -Nausea and pain control -VTE prophylaxis- SCDs, etc -mobilize as tolerated to help recovery  25 minutes spent in review, evaluation, examination, counseling, and coordination of care.  More than 50% of that time was spent in counseling.  12/21/2019    Subjective: (Chief complaint)  Some mild chest discomfort and upper abdominal discomfort at incisions.  Not severe.  Did not get up.  Nursing in room.  Tolerating sips.  Objective:  Vital signs:  Vitals:   12/20/19 1815 12/20/19 2114 12/21/19 0133 12/21/19 0504  BP: 132/83 125/81 121/85 138/89  Pulse: 92 81 76 84  Resp: 18 17 20 16   Temp: 99.4 F (37.4 C) 97.9 F (36.6 C) 98.1 F (36.7 C) 98.1 F (36.7 C)  TempSrc: Oral     SpO2: 91% (!) 88% 92% 92%  Weight:      Height:           Intake/Output   Yesterday:  04/30 0701 - 05/01 0700 In: 2682.4  [P.O.:600; I.V.:1982.4; IV Piggyback:100] Out: 200 [Urine:150; Blood:50] This shift:  Total I/O In: 360 [P.O.:360] Out: -   Bowel function:  Flatus: No  BM:  No  Drain: (No drain)   Physical Exam:  General: Pt awake/alert in no acute distress Eyes: PERRL, normal EOM.  Sclera clear.  No icterus Neuro: CN II-XII intact w/o focal sensory/motor deficits. Lymph: No head/neck/groin lymphadenopathy Psych:  No delerium/psychosis/paranoia.  Oriented x 4 HENT: Normocephalic, Mucus membranes moist.  No thrush Neck: Supple, No tracheal deviation.  No obvious thyromegaly Chest: No pain to chest wall compression.  Good respiratory excursion.  No audible wheezing CV:  Pulses intact.  Regular rhythm.  No major extremity edema MS: Normal AROM mjr joints.  No obvious deformity  Abdomen: Soft.  Nondistended.  Mildly tender at incisions only.  No evidence of peritonitis.  No incarcerated hernias.  Ext:  No deformity.  No mjr edema.  No cyanosis Skin: No petechiae / purpurea.  No major sores.  Warm and dry    Results:   Cultures: Recent Results (from the past 720 hour(s))  SARS CORONAVIRUS 2 (TAT 6-24 HRS) Nasopharyngeal Nasopharyngeal Swab     Status: None   Collection Time: 12/17/19  2:32 PM   Specimen: Nasopharyngeal Swab  Result Value Ref Range Status   SARS Coronavirus 2 NEGATIVE NEGATIVE Final    Comment: (NOTE) SARS-CoV-2 target nucleic acids are NOT DETECTED. The SARS-CoV-2 RNA is generally detectable in upper and lower respiratory  specimens during the acute phase of infection. Negative results do not preclude SARS-CoV-2 infection, do not rule out co-infections with other pathogens, and should not be used as the sole basis for treatment or other patient management decisions. Negative results must be combined with clinical observations, patient history, and epidemiological information. The expected result is Negative. Fact Sheet for  Patients: SugarRoll.be Fact Sheet for Healthcare Providers: https://www.woods-mathews.com/ This test is not yet approved or cleared by the [place] FDA and  has been authorized for detection and/or diagnosis of SARS-CoV-2 by FDA under an Emergency Use Authorization (EUA). This EUA will remain  in effect (meaning this test can be used) for the duration of the COVID-19 declaration under Section 56 4(b)(1) of the Act, 21 U.S.C. section 360bbb-3(b)(1), unless the authorization is terminated or revoked sooner. Performed at [facility], [place] [address] [place], [place] [address]     Labs: Results for orders placed or performed during the hospital encounter of 12/20/19 (from the past 48 hour(s))  Basic metabolic panel     Status: Abnormal   Collection Time: 12/21/19  5:52 AM  Result Value Ref Range   Sodium 137 135 - 145 mmol/L   Potassium 4.1 3.5 - 5.1 mmol/L   Chloride 103 98 - 111 mmol/L   CO2 26 22 - 32 mmol/L   Glucose, Bld 114 (H) 70 - 99 mg/dL    Comment: Glucose reference range applies only to samples taken after fasting for at least 8 hours.   BUN 11 8 - 23 mg/dL   Creatinine, Ser 0.76 0.44 - 1.00 mg/dL   Calcium 9.0 8.9 - 10.3 mg/dL   GFR calc non Af Amer >60 >60 mL/min   GFR calc Af Amer >60 >60 mL/min   Anion gap 8 5 - 15    Comment: Performed at [facility], [place] [address], [place], [place] [address]  CBC     Status: Abnormal   Collection Time: 12/21/19  5:52 AM  Result Value Ref Range   WBC 11.7 (H) 4.0 - 10.5 K/uL   RBC 3.75 (L) 3.87 - 5.11 MIL/uL   Hemoglobin 11.3 (L) 12.0 - 15.0 g/dL   HCT 35.5 (L) 36.0 - 46.0 %   MCV 94.7 80.0 - 100.0 fL   MCH 30.1 26.0 - 34.0 pg   MCHC 31.8 30.0 - 36.0 g/dL   RDW 13.1 11.5 - 15.5 %   Platelets 286 150 - 400 K/uL   nRBC 0.0 0.0 - 0.2 %    Comment: Performed at [facility], [place] [address], [place], [place] [address]    Imaging /  Studies: DG CHEST PORT 1 VIEW  Result Date: 12/20/2019 CLINICAL DATA:  Chest pain EXAM: PORTABLE CHEST 1 VIEW COMPARISON:  May 28, 2019 FINDINGS: There is atelectatic change in each lung base. The lungs elsewhere are clear. Heart size and pulmonary vascularity are normal. No adenopathy. Hiatal hernia no longer evident. No bone lesions. IMPRESSION: Bibasilar atelectasis. Lungs elsewhere clear. Cardiac silhouette within normal limits. Electronically Signed   By: Lowella Grip III M.D.   On: 12/20/2019 11:17    Medications / Allergies: per chart  Antibiotics: Anti-infectives (From admission, onward)   Start     Dose/Rate Route Frequency Ordered Stop   12/20/19 0600  ceFAZolin (ANCEF) IVPB 2g/100 mL premix     2 g 200 mL/hr over 30 Minutes Intravenous On call to O.R. 12/20/19 4818 12/20/19 5631  Note: Portions of this report may have been transcribed using voice recognition software. Every effort was made to ensure accuracy; however, inadvertent computerized transcription errors may be present.   Any transcriptional errors that result from this process are unintentional.     Adin Hector, MD, FACS, MASCRS Gastrointestinal and Minimally Invasive Surgery    [address], [place], [place] [address] [phone] Main / Paging [phone] Fax Please see Amion for pager number, especial 5pm - 7am.

## 2019-12-21 NOTE — Op Note (Signed)
NAMEMERSADES, BARBARO MEDICAL RECORD YC:[id number] ACCOUNT [id number] DATE OF BIRTH:01/09/1956 FACILITY: [facility] LOCATION: [facility] PHYSICIAN:Leydi Winstead Ronnie Derby, MD  OPERATIVE REPORT  DATE OF PROCEDURE:  12/20/2019  PREOPERATIVE DIAGNOSIS:  Paraesophageal hernia with gastroesophageal reflux disease.  POSTOPERATIVE DIAGNOSIS:  Paraesophageal hernia (type 3), gastroesophageal reflux disease.  PROCEDURE:   1.  Laparoscopic paraesophageal hiatal hernia repair with Nissen fundoplication, upper endoscopy. 2.  Laparoscopic bilateral TAP block.  SURGEON:  Greer Pickerel, MD  ASSISTANT:  Ralene Ok, MD   ANESTHESIA:  General.  ESTIMATED BLOOD LOSS:  50 mL.  DRAINS:  None.  SPECIMENS:  Hernia sac, which was discarded.    LOCAL MEDICATIONS:  A mixture of Marcaine and Exparel.  INDICATIONS:  The patient is a 64 year old female with a longstanding history of heartburn whose symptoms have worsened over the past few years.  She has a known paraesophageal hernia.  She had improvement in her heartburn symptoms when switched to  Dexilant, but is still having some burping and intermittent episodes of sensation of regurgitation.  She also has been having large amounts of flatus.  She was referred by gastroenterology for consideration of paraesophageal hernia repair with  fundoplication.  Please see outside records for additional information for that workup.  DESCRIPTION OF PROCEDURE:  The patient was given oral Tylenol and gabapentin preoperatively as part of our ERAS protocol.  She was brought to OR 1 at [facility], placed supine on the operating room table.  General endotracheal anesthesia was  established.  Her arms were tucked at her sides with the appropriate padding.  Sequential compression devices had been placed.  A Foley catheter was placed.  She received IV antibiotic prior to skin incision.  A surgical timeout was performed.  Access to the abdomen was obtained in the left upper  quadrant in the Optiview fashion.  A small incision was made just below the left subcostal margin in the midclavicular line.  Then, using a 0-degree 5 mm laparoscope through a 5 mm trocar the  laparoscope was advanced directly through the abdominal wall into the abdominal cavity.  Pneumoperitoneum was smoothly established up to a patient pressure of 15 mmHg without any change in patient's vital signs.  Laparoscope was advanced.  There was no  evidence of injury to surrounding structures.  She had a little bit of omental adhesions into her lower midline, but did not need to be taken down.  The patient was placed in steep reverse Trendelenburg.  We then went about placing our other trocars.  A  5 mm trocar was placed just about 2 inches above and to the left of the umbilicus, a 5 in the lateral right abdominal wall, and an 11 mm trocar in the right mid abdomen, all under direct visualization.  A Nathanson liver retractor was placed through a  subxiphoid small incision, retracting the left lobe of the liver revealing excellent exposure of the hiatus.  We placed a 5 mm trocar in the lateral left upper quadrant for a 2nd trocar for my assistant.  Bilateral laparoscopic TAP block was performed  with a combination of Marcaine and Exparel.  The patient had a little bit of evidence of hepatic steatosis, but the liver had normal contour.  There was no nodular component.  The patient had fair amount of herniated stomach,up thru the diaphragm.  The GE  junction and the fundus were above the diaphragm.  We were able to reduce some of the stomach, but it was still retracted up into the  mediastinum.  She also had some omentum also in the mediastinum.  Identified the greater curvature of the stomach and  started taking down some short gastrics about 1/3 way from the fundus.  We also reduced some of the incarcerated omentum with Harmonic scalpel because it also had some attachments to the stomach as well.  We identified the  left crus of the diaphragm.  She had a very thick hernia sac  on this side.  I continued to take down the gastrosplenic ligament with Harmonic scalpel.  There was a little bit of bleeding along the stomach edge from where short gastrics had been taken down with Harmonic scalpel.  I did not want to continue using  Harmonic scalpel along the stomach edge so I placed a clip for hemostasis.  As mentioned before, the hernia sac on the left was very thickened and intimately fused with the peritoneum along the left crus of the diaphragm.  Therefore, I decided to go to  the right side.  An assistant was needed throughout the case due to the complexity of the case and the need to help retract tissue as well as to help identify the anatomy.  I incised the gastrohepatic ligament with Harmonic scalpel.  We identified the  right crus of the diaphragm.  The peritoneum and hernia sac just medial to the right crus was incised with Harmonic scalpel and I was able to get in the plane between the hernia sac and the mediastinum.  I did some blunt dissection since this is an  avascular area.  I was able to identify the junction of the left crus and right crus of the diaphragm inferiorly.  Therefore, I was able to pass a blunt grasper retrogastric and grab a Penrose and bring it around retrogastric and secure it to itself in  order to lift up on the upper stomach and distal esophagus to help provide better visualization of the mediastinum.  The esophagus was identified in the mediastinum.  I then went about reducing the hernia sac on the right from the parietal pleura on the  right.  The posterior vagus nerve was identified and preserved.  I then started to continue my dissection and mobilization anteriorly.  The phrenoesophageal membrane was divided with Harmonic scalpel along the anterior aspect of the hiatal orifice.  We  continued anterior mobilization in the mediastinum of the esophagus using combination of gentle blunt  dissection along with Harmonic scalpel, being meticulous in the energy device location in relation to the esophagus.  The aorta had been identified  posteriorly.  I took down some of the avascular tissue posterior to the esophagus and anterior to the aorta.  There was an esophageal fat pad on the right side, which was gently bluntly dissected away from the esophagus with a laparoscopic Kitner.  Once  it was completely isolated from the esophagus I divided it staying about a centimeter away from the esophagus and then that esophageal fat pad was extracted from the abdominal cavity.  We then turned our attention back to the left side.  The hernia sac  was quite large and extended a fair amount up into the mediastinum.  The hernia sac was fused with the parietal pleura on the left and I did get into the left pleural space as evident since we could visualize the left lung.  I was ultimately able to  tease the parietal pleural away from the hernia sac where it was not as thickened.  We were then  able to completely reduce the hernia sac out of the mediastinum back down into the abdominal cavity.  We continued to mobilize the anterior esophagus away  from the pericardium with a gentle blunt mobilization as well as with some Harmonic scalpel ensuring the location of the thermal blade of the Harmonic at all times.  At this point, it appeared that I had achieved adequate intraabdominal length of the  esophagus.  I had transected the anterior vagus in order to get additional length on the esophagus into the abdomen.  My partner scrubbed out and performed an upper endoscopy as another way to confirm the location of the GE junction.  The endoscope was  advanced and placed into the oropharynx and gently went down the esophagus.  There was no evidence of injury to the esophagus as he was doing the endoscope.  We were able to visualize the squamocolumnar junction, which was approximately 3 cm below the  diaphragm.  The  gastric rugae folds was probably another centimeter below that, so we had at least a minimum of 3 cm of intra-abdominal esophagus to the squamocolumnar junction and again the gastric rugae folds was about 1 cm inferior to that, so  potentially 3-4 cm of intraabdominal esophageal length.  This was measured with the esophagus was not under tension and the Penrose no longer tightly secured.  When the esophagus and stomach were no longer being retracted it stayed within the abdominal  cavity and did not telescope back up.  At this point, my partner scrubbed back in.  We decided to debride some of the hernia sac since it was quite bulky and large.  It was carefully isolated from the upper stomach ensuring that we did not injure the  stomach in debulking the hernia sac.  It was placed off to the side for later extraction.  At this point, my assistant lifted up on the distal esophagus to expose the crura.  The width of the distance between the crura was approximately about 2.5 cm.   Length of the diaphragmatic defect was approximately 4-5 cm.  I then reapproximated the left and right crura with 5 interrupted 0 Ethibond Surgidac sutures using the EndoStitch each secured with a titanium Ti-Knot.  This appeared to have adequate closure  of the hiatus leaving a small space for passage of a bougie.  It should be noted that we had closed the crura of the diaphragm with intra-abdominal pressure of 10 mmHg.  At this point, we reinspected the greater curve of the stomach along the upper  fundus to make sure we had taken down appropriate amount of short gastric vessels.  We then went about creating our wrap.  An atraumatic grasper was passed behind the esophagus from right to left.  I then grabbed the posterior wall of the gastric fundus  to the left of the esophagus and pulled it behind forming the wrap.  We then had anesthesia pass a 56-French bougie down the cardia.  I was able to perform a shoeshine maneuver.  I then  performed a loose floppy wrap.  The wrap length was approximately  about 2-2.5 cm.  Three sutures were placed, 1 of 2-0 Ethibond suture.  This incorporated fundus on both sides along with some esophagus and secured with a titanium Ti-Knot.  The other 2 sutures, 1 above and 1 below were placed with 2-0 Ethibond on a free  needle.  The most superior suture also incorporated a muscular bite of the esophagus.  These  2 sutures were also secured with a titanium Ti-Knot.  The clip that had been placed along the greater curve of the stomach was on top of the wrap on the right  side.  There was a little bit of bleeding from the undersurface of the liver where 1 of the free needles had violated the capsule.  Hemostasis was achieved with electrocautery.  At this point, an Echo sac was placed within the abdominal cavity and the  hernia sac was placed in it and the hernia sac and Echo sac were extracted from the abdomen.  The 11 mm trocar fascia space was closed with 2 interrupted 0 Vicryls using a PMI suture passer.  Additional local was infiltrated here.  The [facility] liver  retractor was removed.  There was no evidence of injury to the liver.  I was happy with how the wrap looked.  The bougie had been previously removed.  Pneumoperitoneum was released and all skin incisions were closed with 4-0 Monocryl in a subcuticular  fashion.  All needle, instrument and sponge counts were correct x2.  There were no immediate complications.  The patient was taken to recovery room in stable condition.  A postoperative chest x-ray is pending.  I updated the husband at the conclusion of  the procedure.  CN/NUANCE  D:12/20/2019 T:12/21/2019 JOB:[id number]

## 2019-12-21 NOTE — Progress Notes (Signed)
Pt alert and oriented. Tolerating full liquid diet. No nausea. Pt wanted to go home.  D/C instructions given, pt d/cd to home with spouse.

## 2019-12-25 NOTE — Discharge Summary (Signed)
Physician Discharge Summary  Hailey Galvan NLZ:[id number] DOB: 07/13/1956 DOA: 12/20/2019  PCP: Yetta Flock, MD  Admit date: 12/20/2019 Discharge date: 12/21/2019  Recommendations for Outpatient Follow-up:    Follow-up Information    Greer Pickerel, MD. Schedule an appointment as soon as possible for a visit in 3 weeks.   Specialty: General Surgery Why: For wound re-check Contact information: [place] [address] [phone]          Discharge Diagnoses:  1. Paraesophageal hernia with gerd 2. H/p Crohn's disease  Surgical Procedure: laparoscopic paraesophageal hernia repair with nissen fundoplication  Discharge Condition: good Disposition: home  Diet recommendation: full liquids to soft diet  Filed Weights   12/20/19 0539  Weight: 70.5 kg    History of present illness:  The patient is a 64 year old female with a longstanding history of heartburn whose symptoms have worsened over the past few years.  She has a known paraesophageal hernia.  She had improvement in her heartburn symptoms when switched to  Dexilant, but is still having some burping and intermittent episodes of sensation of regurgitation.  She also has been having large amounts of flatus.  She was referred by gastroenterology for consideration of paraesophageal hernia repair with  fundoplication.  Please see outside records for additional information for that workup.  Hospital Course:  Eras protocol used. Maintained on perioperative chemical vte prophylaxis. Started on clears on POD 0. Underwent swallow on POD1 which showed no leak and typical postop nissen fundoplication anatomy. On POD 1 she was tolerating liquids. Pain controlled. Vitals stable and felt stable by rounding physicians.   Discharge Instructions  Discharge Instructions    Diet - low sodium heart healthy   Complete by: As directed    Discharge instructions   Complete by: As directed    [facility]  LAPAROSCOPIC SURGERY:  POST-OP INSTRUCTIONS  Always review your discharge instruction sheet given to you by the facility where your surgery was performed.  A prescription for pain medication may be given to you upon discharge.  Take your pain medication as prescribed.  If narcotic pain medicine is not needed, then you may take acetaminophen (Tylenol) or ibuprofen (Advil) as needed.  Take your usually prescribed medications unless otherwise directed.  If you need a refill on your pain medication, please contact your pharmacy.  They will contact our office to request authorization. Prescriptions will not be filled after 5 P.M. or on weekends.  You should follow a light diet the first few days after arrival home, such as soup and crackers or toast.  Be sure to include plenty of fluids daily.  Most patients will experience some swelling and bruising in the area of the incisions.  Ice packs will help.  Swelling and bruising can take several days to resolve.   It is common to experience some constipation after surgery.  Increasing fluid intake and taking a stool softener (such as Colace) will usually help or prevent this problem from occurring.  A mild laxative (Milk of Magnesia or Miralax) should be taken according to package instructions if there has been no bowel movement after 48 hours.  You will likely have Dermabond (topical glue) over your incisions.  This seals the incisions and allows you to bathe and shower at any time after your surgery.  Glue should remain in place for up to 10 days.  It may be removed after 10 days by pealing off the Dermabond material or using Vaseline  or naval jelly to remove.  If you have steri-strips over your incisions, you may remove the gauze bandage on the second day after surgery, and you may shower at that time.  Leave your steri-strips (small skin tapes) in place directly over the incision.  These strips should remain on the skin for 5-7 days and  then be removed.  You may get them wet in the shower and pat them dry.  Any sutures or staples will be removed at the office during your follow-up visit.  ACTIVITIES:  You may resume regular (light) daily activities beginning the next day - such as daily self-care, walking, climbing stairs - gradually increasing activities as tolerated.  You may have sexual intercourse when it is comfortable.  Refrain from any heavy lifting or straining until approved by your doctor.  You may drive when you are no longer taking prescription pain medication, when you can comfortably wear a seatbelt, and when you can safely maneuver your car and apply brakes.  You should see your doctor in the office for a follow-up appointment approximately 2-3 weeks after your surgery.  Make sure that you call for this appointment within a day or two after you arrive home to insure a convenient appointment time.  WHEN TO CALL YOUR DOCTOR: Fever over 101.0 Inability to urinate Continued bleeding from incision Increased pain, redness, or drainage from the incision Increasing abdominal pain  The clinic staff is available to answer your questions during regular business hours.  Please don't hesitate to call and ask to speak to one of the nurses for clinical concerns.  If you have a medical emergency, go to the nearest emergency room or call 911.  A surgeon from [facility] is always on call for the hospital.  Earnstine Regal, MD, [facility] Office: [place] Free:  [place] [phone]  Website: [email or web address]   Increase activity slowly   Complete by: As directed    No dressing needed   Complete by: As directed      Allergies as of 12/21/2019      Reactions   Etodolac Other (See Comments)   Sleepiness, feeling "out of it" Sleepiness, feeling "out of it"   Naproxen Other (See Comments)   "flu like symptoms"   Zanaflex [tizanidine Hcl] Other (See  Comments)   Hallucinations.      Medication List    STOP taking these medications   dexlansoprazole 60 MG capsule Commonly known as: DEXILANT   pantoprazole 40 MG tablet Commonly known as: PROTONIX   sucralfate 1 GM/10ML suspension Commonly known as: CARAFATE     TAKE these medications   Adderall XR 10 MG 24 hr capsule Generic drug: amphetamine-dextroamphetamine Take 20 mg by mouth daily.   atorvastatin 10 MG tablet Commonly known as: LIPITOR TAKE 1 TABLET BY MOUTH ONCE DAILY AT  6  P.M. **PATIENT NEEDS APT FOR FURTHER REFILLS** What changed:   how much to take  how to take this  when to take this   butalbital-acetaminophen-caffeine 50-325-40 MG tablet Commonly known as: FIORICET TAKE 1 TABLET BY MOUTH EVERY 6 HOURS AS NEEDED What changed: reasons to take this   cyclobenzaprine 10 MG tablet Commonly known as: FLEXERIL Take 1 tablet (10 mg total) by mouth at bedtime. What changed:   when to take this  reasons to take this   DULoxetine 60 MG capsule Commonly known as: CYMBALTA Take 1 capsule (60 mg total) by mouth daily.   fluconazole  200 MG tablet Commonly known as: DIFLUCAN Take one dose by mouth, wait 72 hours, and then take second dose by mouth   gabapentin 600 MG tablet Commonly known as: NEURONTIN Take 1 tablet (600 mg total) by mouth daily.   nitrofurantoin (macrocrystal-monohydrate) 100 MG capsule Commonly known as: MACROBID Take 1 capsule (100 mg total) by mouth 2 (two) times daily.   omeprazole 40 MG capsule Commonly known as: PRILOSEC Take 40 mg by mouth 2 (two) times daily.   oxyCODONE 5 MG immediate release tablet Commonly known as: Oxy IR/ROXICODONE Take 1-2 tablets (5-10 mg total) by mouth every 4 (four) hours as needed for moderate pain.   traMADol 50 MG tablet Commonly known as: ULTRAM Take 1 tablet (50 mg total) by mouth every 8 (eight) hours as needed.   vitamin B-12 1000 MCG tablet Commonly known as: CYANOCOBALAMIN Take  1,000 mcg by mouth daily.      Follow-up Information    Greer Pickerel, MD. Schedule an appointment as soon as possible for a visit in 3 weeks.   Specialty: General Surgery Why: For wound re-check Contact information: [place] [address] [phone]            The results of significant diagnostics from this hospitalization (including imaging, microbiology, ancillary and laboratory) are listed below for reference.    Significant Diagnostic Studies: DG CHEST PORT 1 VIEW  Result Date: 12/20/2019 CLINICAL DATA:  Chest pain EXAM: PORTABLE CHEST 1 VIEW COMPARISON:  May 28, 2019 FINDINGS: There is atelectatic change in each lung base. The lungs elsewhere are clear. Heart size and pulmonary vascularity are normal. No adenopathy. Hiatal hernia no longer evident. No bone lesions. IMPRESSION: Bibasilar atelectasis. Lungs elsewhere clear. Cardiac silhouette within normal limits. Electronically Signed   By: Lowella Grip III M.D.   On: 12/20/2019 11:17   DG UGI W SINGLE CM (SOL OR THIN BA)  Result Date: 12/21/2019 CLINICAL DATA:  Patient underwent Nissen fundoplication on 96/28/3662. Study requested to rule out leak or obstruction. EXAM: WATER SOLUBLE UPPER GI SERIES TECHNIQUE: Single-column upper GI series was performed using water soluble contrast. CONTRAST:  50 ml omnipaque 300 COMPARISON:  None. FLUOROSCOPY TIME:  Fluoroscopy Time: 1.24 sec Radiation Exposure Index (if provided by the fluoroscopic device): 57 mGy Number of Acquired Spot Images: 2 FINDINGS: On the scout imaging there are surgical changes in the left upper quadrant at the site of patient's recent fundoplication. Water-soluble contrast was administered to the patient orally under fluoroscopic visualization. There is no evidence of extraluminal contrast. The contrast material passed easily through the distal esophagus and into the stomach without evidence of obstruction. Postprocedure overhead  radiograph demonstrates contrast within the stomach lumen. IMPRESSION: No evidence for leak or obstruction following Nissen fundoplication. Electronically Signed   By: Audie Pinto M.D.   On: 12/21/2019 11:28    Microbiology: Recent Results (from the past 240 hour(s))  SARS CORONAVIRUS 2 (TAT 6-24 HRS) Nasopharyngeal Nasopharyngeal Swab     Status: None   Collection Time: 12/17/19  2:32 PM   Specimen: Nasopharyngeal Swab  Result Value Ref Range Status   SARS Coronavirus 2 NEGATIVE NEGATIVE Final    Comment: (NOTE) SARS-CoV-2 target nucleic acids are NOT DETECTED. The SARS-CoV-2 RNA is generally detectable in upper and lower respiratory specimens during the acute phase of infection. Negative results do not preclude SARS-CoV-2 infection, do not rule out co-infections with other pathogens, and should not be used as the sole basis for  treatment or other patient management decisions. Negative results must be combined with clinical observations, patient history, and epidemiological information. The expected result is Negative. Fact Sheet for Patients: SugarRoll.be Fact Sheet for Healthcare Providers: https://www.woods-mathews.com/ This test is not yet approved or cleared by the [place] FDA and  has been authorized for detection and/or diagnosis of SARS-CoV-2 by FDA under an Emergency Use Authorization (EUA). This EUA will remain  in effect (meaning this test can be used) for the duration of the COVID-19 declaration under Section 56 4(b)(1) of the Act, 21 U.S.C. section 360bbb-3(b)(1), unless the authorization is terminated or revoked sooner. Performed at [facility], [place] [address], [place], [place] [address]      Labs: Basic Metabolic Panel: Recent Labs  Lab 12/21/19 0552  NA 137  K 4.1  CL 103  CO2 26  GLUCOSE 114*  BUN 11  CREATININE 0.76  CALCIUM 9.0   Liver Function Tests: No results for input(s): AST, ALT,  ALKPHOS, BILITOT, PROT, ALBUMIN in the last 168 hours. No results for input(s): LIPASE, AMYLASE in the last 168 hours. No results for input(s): AMMONIA in the last 168 hours. CBC: Recent Labs  Lab 12/21/19 0552  WBC 11.7*  HGB 11.3*  HCT 35.5*  MCV 94.7  PLT 286   Cardiac Enzymes: No results for input(s): CKTOTAL, CKMB, CKMBINDEX, TROPONINI in the last 168 hours. BNP: BNP (last 3 results) No results for input(s): BNP in the last 8760 hours.  ProBNP (last 3 results) No results for input(s): PROBNP in the last 8760 hours.  CBG: No results for input(s): GLUCAP in the last 168 hours.  Principal Problem:   S/P repair of paraesophageal hernia Active Problems:   Hyperlipidemia   Neuralgia and neuritis, unspecified   Urge incontinence   Crohn's disease ([place])   Time coordinating discharge: 10 min  Signed:  Gayland Curry, MD [facility], [place] [phone] 12/25/2019, 5:37 PM

## 2020-01-02 ENCOUNTER — Other Ambulatory Visit: Payer: Self-pay | Admitting: Adult Health

## 2020-01-08 ENCOUNTER — Other Ambulatory Visit: Payer: Self-pay | Admitting: Adult Health

## 2020-01-14 ENCOUNTER — Other Ambulatory Visit: Payer: Self-pay | Admitting: Physician Assistant

## 2020-01-14 NOTE — Telephone Encounter (Signed)
Patient called to check on why her Rx refill was denied (advised her pharmacy was sending to [facility] basket in error & was denied by her since she is no longer @ [facility]):  butalbital-acetaminophen-caffeine (FIORICET) 50-325-40 MG tablet [[id number]]   Order Details Dose, Route, Frequency: As Directed  Dispense Quantity: 14 tablet Refills: 0       Sig: TAKE 1 TABLET BY MOUTH EVERY 6 HOURS AS NEEDED  Patient taking differently: Take 1 tablet by mouth every 6 (six) hours as needed for headache.        --Forwarding request to med asst for review w/ provider & if approved send refill order to :  [facility], [place] [phone] (Phone) [phone] (Fax)   --Please contact pt if there are any questions or concerns.  --glh

## 2020-01-14 NOTE — Telephone Encounter (Signed)
LVM for pt to call to discuss.  Pt's chart indicates that she has a new PCP.  Also, pt did not follow up for [facility] 09/2019 as instructed.  Charyl Bigger, CMA

## 2020-01-15 ENCOUNTER — Other Ambulatory Visit: Payer: Self-pay | Admitting: Gastroenterology

## 2020-01-15 MED ORDER — BUTALBITAL-APAP-CAFFEINE 50-325-40 MG PO TABS: 1.0000 | ORAL_TABLET | Freq: Four times a day (QID) | ORAL | 0 refills | Status: DC | PRN

## 2020-01-15 NOTE — Telephone Encounter (Signed)
Pt states that Dr. Havery Moros is her GI specialist and not PCP.  Also advised pt that we can only send a partial RX for Fioricet because she did not f/u with [facility] in February as directed.  Pt expressed understanding and was transferred to front desk to schedule appt.  Charyl Bigger, CMA

## 2020-01-16 NOTE — Telephone Encounter (Signed)
Ok to refill?  Patient last seen 05-2019. Does she need a Follow up appt?

## 2020-01-16 NOTE — Telephone Encounter (Signed)
If this works for her okay to refill. She does need a follow up.thanks

## 2020-01-24 ENCOUNTER — Other Ambulatory Visit: Payer: Self-pay | Admitting: Family Medicine

## 2020-01-28 ENCOUNTER — Other Ambulatory Visit: Payer: Self-pay | Admitting: Gastroenterology

## 2020-02-20 ENCOUNTER — Other Ambulatory Visit: Payer: Self-pay | Admitting: Physician Assistant

## 2020-02-20 DIAGNOSIS — E785 Hyperlipidemia, unspecified: Secondary | ICD-10-CM

## 2020-02-20 DIAGNOSIS — Z0000 Encounter for general adult medical examination without abnormal findings: Secondary | ICD-10-CM

## 2020-02-25 ENCOUNTER — Other Ambulatory Visit: Payer: 59

## 2020-03-03 ENCOUNTER — Encounter: Payer: 59 | Admitting: Physician Assistant

## 2020-04-06 ENCOUNTER — Other Ambulatory Visit: Payer: Self-pay | Admitting: Gastroenterology

## 2020-04-07 NOTE — Telephone Encounter (Signed)
Can refill and make her a routine office follow up visit in the next few months. Thanks

## 2020-04-07 NOTE — Telephone Encounter (Signed)
Patient is requesting a year supply of gabapentin. Last seen 07-2019. When would you like to see her back in the office?

## 2020-04-20 ENCOUNTER — Other Ambulatory Visit: Payer: Self-pay

## 2020-04-20 ENCOUNTER — Ambulatory Visit (INDEPENDENT_AMBULATORY_CARE_PROVIDER_SITE_OTHER): Payer: 59 | Admitting: Physician Assistant

## 2020-04-20 ENCOUNTER — Encounter: Payer: Self-pay | Admitting: Physician Assistant

## 2020-04-20 VITALS — BP 127/75 | HR 78 | Ht 64.0 in | Wt 151.6 lb

## 2020-04-20 DIAGNOSIS — M545 Low back pain, unspecified: Secondary | ICD-10-CM

## 2020-04-20 DIAGNOSIS — R5383 Other fatigue: Secondary | ICD-10-CM

## 2020-04-20 DIAGNOSIS — Z0000 Encounter for general adult medical examination without abnormal findings: Secondary | ICD-10-CM | POA: Diagnosis not present

## 2020-04-20 DIAGNOSIS — E049 Nontoxic goiter, unspecified: Secondary | ICD-10-CM

## 2020-04-20 LAB — POCT URINALYSIS DIPSTICK
Bilirubin, UA: NEGATIVE
Blood, UA: NEGATIVE
Glucose, UA: NEGATIVE
Ketones, UA: NEGATIVE
Nitrite, UA: NEGATIVE
Protein, UA: NEGATIVE
Spec Grav, UA: >=1.03 — AB (ref 1.010–1.025)
Urobilinogen, UA: 0.2 E.U./dL
pH, UA: 5.5 (ref 5.0–8.0)

## 2020-04-20 NOTE — Progress Notes (Signed)
Acute Office Visit  Subjective:    Patient ID: Hailey Galvan, female    DOB: 1956-03-03, 64 y.o.   MRN: [id number]  Chief Complaint  Patient presents with  . Goiter    HPI Patient is in today for concerns of thyroid lump. Reports she noticed lump about 1 month ago. Initially she thought she pulled a muscle. States lump has grew in size. Reports fatigue and heat intolerance. Denies prior history of thyroid disorder, weight changes, or bowel changes. She does have family history of hypothyroid. Also complains of back pain on the right side and urethral discharge. States urethral discharge has been present for a while, started after hysterectomy and it is ivory color. Denies yellow or green discharge. States she has a history of UTI's and sometimes doesn't have urinary symptoms.  Past Medical History:  Diagnosis Date  . Allergy   . Crohn disease ([place])   . Depression .  Marland Kitchen Hiatal hernia   . Ileitis     Past Surgical History:  Procedure Laterality Date  . ABDOMINAL HYSTERECTOMY    . BREAST EXCISIONAL BIOPSY Right   . BREAST SURGERY     Tumor Removal  . CESAREAN SECTION    . COLONOSCOPY  last 03/30/2018   at [facility]=polyps  . KNEE SURGERY    . TONSILLECTOMY      Family History  Problem Relation Age of Onset  . Alcohol abuse Mother   . Stroke Mother   . Diabetes Father   . Depression Paternal Grandmother   . Colon cancer Neg Hx   . Esophageal cancer Neg Hx   . Rectal cancer Neg Hx   . Stomach cancer Neg Hx   . Colon polyps Neg Hx     Social History   Socioeconomic History  . Marital status: Married    Spouse name: Not on file  . Number of children: Not on file  . Years of education: Not on file  . Highest education level: Not on file  Occupational History  . Not on file  Tobacco Use  . Smoking status: Current Every Day Smoker    Packs/day: 0.25    Years: 5.00    Pack years: 1.25    Types: Cigarettes  . Smokeless tobacco: Never Used  Vaping Use  . Vaping Use:  Never used  Substance and Sexual Activity  . Alcohol use: Yes    Alcohol/week: 14.0 standard drinks    Types: 14 Glasses of wine per week    Comment: every night a glass of wine  . Drug use: Never  . Sexual activity: Not Currently  Other Topics Concern  . Not on file  Social History Narrative  . Not on file   Social Determinants of Health   Financial Resource Strain:   . Difficulty of Paying Living Expenses: Not on file  Food Insecurity:   . Worried About Charity fundraiser in the Last Year: Not on file  . Ran Out of Food in the Last Year: Not on file  Transportation Needs:   . Lack of Transportation (Medical): Not on file  . Lack of Transportation (Non-Medical): Not on file  Physical Activity:   . Days of Exercise per Week: Not on file  . Minutes of Exercise per Session: Not on file  Stress:   . Feeling of Stress : Not on file  Social Connections:   . Frequency of Communication with Friends and Family: Not on file  . Frequency of Social Gatherings with  Friends and Family: Not on file  . Attends Religious Services: Not on file  . Active Member of Clubs or Organizations: Not on file  . Attends Archivist Meetings: Not on file  . Marital Status: Not on file  Intimate Partner Violence:   . Fear of Current or Ex-Partner: Not on file  . Emotionally Abused: Not on file  . Physically Abused: Not on file  . Sexually Abused: Not on file    Outpatient Medications Prior to Visit  Medication Sig Dispense Refill  . atorvastatin (LIPITOR) 10 MG tablet TAKE 1 TABLET BY MOUTH ONCE DAILY AT  6  P.M. **PATIENT NEEDS APT FOR FURTHER REFILLS** (Patient taking differently: Take 10 mg by mouth daily. TAKE 1 TABLET BY MOUTH ONCE DAILY AT  6  P.M. **PATIENT NEEDS APT FOR FURTHER REFILLS**) 30 tablet 0  . butalbital-acetaminophen-caffeine (FIORICET) 50-325-40 MG tablet Take 1 tablet by mouth every 6 (six) hours as needed. 7 tablet 0  . cyclobenzaprine (FLEXERIL) 10 MG tablet Take 1  tablet (10 mg total) by mouth at bedtime. Please schedule an OV for further refills. Thank you. 30 tablet 1  . DULoxetine (CYMBALTA) 60 MG capsule Take 1 capsule (60 mg total) by mouth daily. 90 capsule 3  . gabapentin (NEURONTIN) 600 MG tablet Take 1 tablet (600 mg total) by mouth daily. PLEASE SCHEDULE AN OFFICE VISIT: [phone] THANK YOU 90 tablet 3  . Methylphenidate HCl (RITALIN PO) Take 28.7 mg by mouth.    . vitamin B-12 (CYANOCOBALAMIN) 1000 MCG tablet Take 1,000 mcg by mouth daily.    . ADDERALL XR 10 MG 24 hr capsule Take 20 mg by mouth daily.    . fluconazole (DIFLUCAN) 200 MG tablet Take one dose by mouth, wait 72 hours, and then take second dose by mouth (Patient not taking: Reported on 12/03/2019) 2 tablet 0  . nitrofurantoin, macrocrystal-monohydrate, (MACROBID) 100 MG capsule Take 1 capsule (100 mg total) by mouth 2 (two) times daily. (Patient not taking: Reported on 12/03/2019) 10 capsule 0  . omeprazole (PRILOSEC) 40 MG capsule Take 40 mg by mouth 2 (two) times daily.    Marland Kitchen oxyCODONE (OXY IR/ROXICODONE) 5 MG immediate release tablet Take 1-2 tablets (5-10 mg total) by mouth every 4 (four) hours as needed for moderate pain. 20 tablet 0  . traMADol (ULTRAM) 50 MG tablet Take 1 tablet (50 mg total) by mouth every 8 (eight) hours as needed. (Patient not taking: Reported on 12/03/2019) 90 tablet 0   Facility-Administered Medications Prior to Visit  Medication Dose Route Frequency Provider Last Rate Last Admin  . 0.9 %  sodium chloride infusion  500 mL Intravenous Once Armbruster, [name], MD        Allergies  Allergen Reactions  . Etodolac Other (See Comments)    Sleepiness, feeling "out of it" Sleepiness, feeling "out of it"   . Naproxen Other (See Comments)    "flu like symptoms"  . Zanaflex [Tizanidine Hcl] Other (See Comments)    Hallucinations.    Review of Systems Review of Systems:  A fourteen system review of systems was performed and found to be positive as per  HPI.  Objective:    Physical Exam General:  Well Developed, well nourished, appropriate for stated age.  Neuro:  Alert and oriented,  extra-ocular muscles intact  HEENT:  Normocephalic, atraumatic, mild tenderness of sternocleidomastoid muscle, +small goiter Skin:  no gross rash, warm, pink. Cardiac:  RRR, S1 S2 Abd: No CVA tenderness Respiratory:  ECTA B/L, Not using accessory muscles, speaking in full sentences- unlabored. Vascular:  Ext warm, no cyanosis apprec.; cap RF less 2 sec. Psych:  No HI/SI, judgement and insight good, Euthymic mood. Full Affect.   BP 127/75   Pulse 78   Ht 5' 4"  (1.626 m)   Wt 151 lb 9.6 oz (68.8 kg)   SpO2 100%   BMI 26.02 kg/m  Wt Readings from Last 3 Encounters:  04/20/20 151 lb 9.6 oz (68.8 kg)  12/20/19 155 lb 8 oz (70.5 kg)  12/11/19 155 lb 8 oz (70.5 kg)    Health Maintenance Due  Topic Date Due  . INFLUENZA VACCINE  03/22/2020    There are no preventive care reminders to display for this patient.   Lab Results  Component Value Date   TSH 2.000 04/24/2019   Lab Results  Component Value Date   WBC 11.7 (H) 12/21/2019   HGB 11.3 (L) 12/21/2019   HCT 35.5 (L) 12/21/2019   MCV 94.7 12/21/2019   PLT 286 12/21/2019   Lab Results  Component Value Date   NA 137 12/21/2019   K 4.1 12/21/2019   CO2 26 12/21/2019   GLUCOSE 114 (H) 12/21/2019   BUN 11 12/21/2019   CREATININE 0.76 12/21/2019   BILITOT 0.5 12/11/2019   ALKPHOS 113 12/11/2019   AST 45 (H) 12/11/2019   ALT 58 (H) 12/11/2019   PROT 7.8 12/11/2019   ALBUMIN 4.5 12/11/2019   CALCIUM 9.0 12/21/2019   ANIONGAP 8 12/21/2019   GFR 68.33 05/24/2019   Lab Results  Component Value Date   CHOL 251 (H) 04/24/2019   Lab Results  Component Value Date   HDL 78 04/24/2019   Lab Results  Component Value Date   LDLCALC 147 (H) 04/24/2019   Lab Results  Component Value Date   TRIG 148 04/24/2019   Lab Results  Component Value Date   CHOLHDL 3.2 04/24/2019   Lab  Results  Component Value Date   HGBA1C 5.5 04/24/2019       Assessment & Plan:   Problem List Items Addressed This Visit      Other   Healthcare maintenance   Relevant Orders   TSH   T4, free   T3   CBC   Comprehensive metabolic panel   POCT urinalysis dipstick (Completed)   Urine Culture    Other Visit Diagnoses    Thyroid goiter    -  Primary   Relevant Orders   US THYROID   TSH   T4, free   T3   Acute right-sided low back pain without sciatica       Relevant Orders   US THYROID   TSH   T4, free   T3   CBC   Comprehensive metabolic panel   POCT urinalysis dipstick (Completed)   Urine Culture   Fatigue, unspecified type         Thyroid goiter: -Placed orders for thyroid ultrasound and thyroid panel for further evaluation. -Pending thyroid labs will start treatment if indicated.  Acute right-sided low back pain without sciatica: -Due to history of UTI's without urinary symptoms will collect urinalysis and send for urine culture. Pending urine culture, will start antibiotic therapy if indicated. -Stay well hydrated. -Recommend urology referral if UTI's become recurrent for further evaluation.  Fatigue: -Placed lab orders to evaluate for possible etiologies such as anemia, renal disease, thyroid disease or electrolyte imbalance.  No orders of the defined types were placed in this encounter.  Lorrene Reid, PA-C

## 2020-04-20 NOTE — Patient Instructions (Signed)
Goiter  A goiter is an enlarged thyroid gland. The thyroid is located in the lower front of the neck. It makes hormones that affect many body parts and systems, including the system that affects how quickly the body burns fuel for energy (metabolism). Most goiters are painless and are not a cause for concern. Some goiters can affect the way your thyroid makes thyroid hormones. Goiters and conditions that cause goiters can be treated, if necessary. What are the causes? Common causes of this condition include:  Lack (deficiency) of a mineral called iodine. The thyroid gland uses iodine to make thyroid hormones.  Diseases that attack healthy cells in the body (autoimmune diseases) and affect thyroid function, such as Graves' disease or Hashimoto's disease. These diseases may cause the body to produce too much thyroid hormone (hyperthyroidism) or too little of the hormone (hypothyroidism).  Conditions that cause inflammation of the thyroid (thyroiditis).  One or more small growths on the thyroid (nodular goiter). Other causes include:  Medical problems caused by abnormal genes that are passed from parent to child (genetic defects).  Thyroid injury or infection.  Tumors that may or may not be cancerous.  Pregnancy.  Certain medicines.  Exposure to radiation. In some cases, the cause may not be known. What increases the risk? This condition is more likely to develop in:  People who do not get enough iodine in their diet.  People who have a family history of goiter.  Women.  People who are older than age 27.  People who smoke tobacco.  People who have had exposure to radiation. What are the signs or symptoms? The main symptom of this condition is swelling in the lower, front part of the neck. This swelling can range from a very small bump to a large lump. Other symptoms may include:  A tight feeling in the throat.  A hoarse voice.  Coughing.  Wheezing.  Difficulty  swallowing or breathing.  Bulging veins in the neck.  Dizziness. When a goiter is the result of an overactive thyroid (hyperthyroidism), symptoms may also include:  Nervousness or restlessness.  Inability to tolerate heat.  Unexplained weight loss.  Diarrhea.  Change in the texture of hair or skin.  Changes in heartbeat, such as skipped beats, extra beats, or a rapid heart rate.  Loss of menstruation.  Shaky hands.  Increased appetite.  Sleep problems. When a goiter is the result of an underactive thyroid (hypothyroidism), symptoms may also include:  Feeling like you have no energy (lethargy).  Inability to tolerate cold.  Weight gain that is not explained by a change in diet or exercise habits.  Dry skin.  Coarse hair.  Irregular menstrual periods.  Constipation.  Sadness or depression.  Fatigue. In some cases, there may not be any symptoms and the thyroid hormone levels may be normal. How is this diagnosed? This condition may be diagnosed based on your symptoms, your medical history, and a physical exam. You may have tests, such as:  Blood tests to check thyroid function.  Imaging tests, such as: ? Ultrasound. ? CT scan. ? MRI. ? Thyroid scan.  Removal of a tissue sample (biopsy) of the goiter or any nodules. The sample will be tested to check for cancer. How is this treated? Treatment for this condition depends on the cause and your symptoms. Treatment may include:  Medicines to regulate thyroid hormone levels.  Anti-inflammatory medicines or steroid medicines, if the goiter is caused by inflammation.  Iodine supplements or changes to your  diet, if the goiter is caused by iodine deficiency.  Radioactive iodine treatment.  Surgery to remove your thyroid. In some cases, you may only need regular check-ups with your health care provider to monitor your condition, and you may not need treatment. Follow these instructions at home:  Follow  instructions from your health care provider about any changes to your diet.  Take over-the-counter and prescription medicines only as told by your health care provider. These include supplements.  Do not use any products that contain nicotine or tobacco, such as cigarettes and e-cigarettes. If you need help quitting, ask your health care provider.  Keep all follow-up visits as told by your health care provider. This is important. Contact a health care provider if:  Your symptoms do not get better with treatment.  You have nausea, vomiting, or diarrhea. Get help right away if:  You have sudden, unexplained confusion or other mental changes.  You have a fever.  You have chest pain.  You have trouble breathing or swallowing.  You suddenly become very weak.  You experience extreme restlessness.  You feel your heart racing. Summary  A goiter is an enlarged thyroid gland.  The thyroid gland is located in the lower front of the neck. It makes hormones that affect many body parts and systems, including the system that affects how quickly the body burns fuel for energy (metabolism).  The main symptom of this condition is swelling in the lower, front part of the neck. This swelling can range from a very small bump to a large lump.  Treatment for this condition depends on the cause and your symptoms. You may need medicines, supplements, or regular monitoring of your condition. This information is not intended to replace advice given to you by your health care provider. Make sure you discuss any questions you have with your health care provider. Document Revised: 07/21/2017 Document Reviewed: 05/04/2017 Elsevier Patient Education  2020 [employer].

## 2020-04-21 LAB — COMPREHENSIVE METABOLIC PANEL
ALT: 49 IU/L — ABNORMAL HIGH (ref 0–32)
AST: 39 IU/L (ref 0–40)
Albumin/Globulin Ratio: 1.7 (ref 1.2–2.2)
Albumin: 5 g/dL — ABNORMAL HIGH (ref 3.8–4.8)
Alkaline Phosphatase: 155 IU/L — ABNORMAL HIGH (ref 48–121)
BUN/Creatinine Ratio: 14 (ref 12–28)
BUN: 10 mg/dL (ref 8–27)
Bilirubin Total: 0.3 mg/dL (ref 0.0–1.2)
CO2: 23 mmol/L (ref 20–29)
Calcium: 10 mg/dL (ref 8.7–10.3)
Chloride: 99 mmol/L (ref 96–106)
Creatinine, Ser: 0.72 mg/dL (ref 0.57–1.00)
GFR calc Af Amer: 102 mL/min/{1.73_m2} (ref >59)
GFR calc non Af Amer: 89 mL/min/{1.73_m2} (ref >59)
Globulin, Total: 2.9 g/dL (ref 1.5–4.5)
Glucose: 114 mg/dL — ABNORMAL HIGH (ref 65–99)
Potassium: 4.6 mmol/L (ref 3.5–5.2)
Sodium: 138 mmol/L (ref 134–144)
Total Protein: 7.9 g/dL (ref 6.0–8.5)

## 2020-04-21 LAB — T3: T3, Total: 71 ng/dL (ref 71–180)

## 2020-04-21 LAB — CBC
Hematocrit: 39 % (ref 34.0–46.6)
Hemoglobin: 13.4 g/dL (ref 11.1–15.9)
MCH: 31.5 pg (ref 26.6–33.0)
MCHC: 34.4 g/dL (ref 31.5–35.7)
MCV: 92 fL (ref 79–97)
Platelets: 322 10*3/uL (ref 150–450)
RBC: 4.25 x10E6/uL (ref 3.77–5.28)
RDW: 13.3 % (ref 11.7–15.4)
WBC: 8.8 10*3/uL (ref 3.4–10.8)

## 2020-04-21 LAB — TSH: TSH: 1.77 u[IU]/mL (ref 0.450–4.500)

## 2020-04-21 LAB — T4, FREE: Free T4: 0.75 ng/dL — ABNORMAL LOW (ref 0.82–1.77)

## 2020-04-22 ENCOUNTER — Telehealth: Payer: Self-pay

## 2020-04-22 DIAGNOSIS — R7989 Other specified abnormal findings of blood chemistry: Secondary | ICD-10-CM

## 2020-04-22 LAB — URINE CULTURE

## 2020-04-22 NOTE — Telephone Encounter (Signed)
-----   Message from Roetta Sessions, [place] sent at 04/15/2020  5:20 PM EDT ----- Regarding: FW: OV  ----- Message ----- From: Roetta Sessions, CMA Sent: 04/15/2020 To: Roetta Sessions, CMA Subject: OV                                             Did pt schedule an OV with Armbruster? If not, call her to schedule

## 2020-04-22 NOTE — Telephone Encounter (Signed)
Pt scheduled for an OV on Monday, 11-1 at 9:20am.  Send MyChart message to pt.

## 2020-04-23 NOTE — Telephone Encounter (Signed)
-----   Message from Lorrene Reid, [place] sent at 04/22/2020  6:11 PM EDT ----- Please call Ms. Woodin and notify of lab results. Urine culture was negative, CBC is normal, CMP shows alkaline phosphatase is elevated and has previously been normal so recommend to follow-up with gastroenterology. TSH is normal, free T4 hormone is low and T3 is borderline normal so recommend endocrinology referral for further evaluation.   Thank you, Herb Grays

## 2020-04-28 ENCOUNTER — Ambulatory Visit
Admission: RE | Admit: 2020-04-28 | Discharge: 2020-04-28 | Disposition: A | Discharge status: Home / Self Care | Payer: 59 | Source: Ambulatory Visit | Attending: Physician Assistant | Admitting: Physician Assistant

## 2020-04-28 ENCOUNTER — Other Ambulatory Visit: Payer: Self-pay

## 2020-04-28 DIAGNOSIS — M545 Low back pain, unspecified: Secondary | ICD-10-CM

## 2020-04-28 DIAGNOSIS — E049 Nontoxic goiter, unspecified: Secondary | ICD-10-CM

## 2020-04-28 DIAGNOSIS — R7989 Other specified abnormal findings of blood chemistry: Secondary | ICD-10-CM

## 2020-04-28 NOTE — Progress Notes (Signed)
Orders entered per SA. See Mychart message

## 2020-04-29 ENCOUNTER — Encounter: Payer: Self-pay | Admitting: Physician Assistant

## 2020-04-29 DIAGNOSIS — R7989 Other specified abnormal findings of blood chemistry: Secondary | ICD-10-CM

## 2020-04-30 NOTE — Telephone Encounter (Signed)
Referral has been placed. AS, CMA

## 2020-05-07 ENCOUNTER — Telehealth: Payer: Self-pay | Admitting: Physician Assistant

## 2020-05-07 NOTE — Telephone Encounter (Signed)
-----   Message from Allegra Lai sent at 05/01/2020  1:36 PM EDT ----- We will decline the referral.  Thyroid level normal Ty,

## 2020-05-07 NOTE — Telephone Encounter (Signed)
Patient scheduled to come in for additional thyroid labs. AS, CMA

## 2020-05-25 ENCOUNTER — Other Ambulatory Visit (INDEPENDENT_AMBULATORY_CARE_PROVIDER_SITE_OTHER): Payer: 59

## 2020-05-25 DIAGNOSIS — R7989 Other specified abnormal findings of blood chemistry: Secondary | ICD-10-CM

## 2020-05-25 LAB — HEPATIC FUNCTION PANEL
ALT: 37 U/L — ABNORMAL HIGH (ref 0–35)
AST: 30 U/L (ref 0–37)
Albumin: 4.7 g/dL (ref 3.5–5.2)
Alkaline Phosphatase: 108 U/L (ref 39–117)
Bilirubin, Direct: 0.1 mg/dL (ref 0.0–0.3)
Total Bilirubin: 0.6 mg/dL (ref 0.2–1.2)
Total Protein: 7.8 g/dL (ref 6.0–8.3)

## 2020-05-25 LAB — GAMMA GT: GGT: 37 U/L (ref 7–51)

## 2020-05-27 ENCOUNTER — Other Ambulatory Visit: Payer: 59

## 2020-05-27 DIAGNOSIS — Z20822 Contact with and (suspected) exposure to covid-19: Secondary | ICD-10-CM

## 2020-05-28 LAB — NOVEL CORONAVIRUS, NAA: SARS-CoV-2, NAA: NOT DETECTED

## 2020-05-28 LAB — SARS-COV-2, NAA 2 DAY TAT

## 2020-06-14 IMAGING — RF DG UGI W SINGLE CM
8 of 9 series · 16 of 24 positions shown · IV contrast (omnipaque)
Comparison: None.

CLINICAL DATA: Patient underwent Nissen fundoplication on
12/20/2019. Study requested to rule out leak or obstruction.

EXAM:
WATER SOLUBLE UPPER GI SERIES
TECHNIQUE: Single-column upper GI series was performed using water soluble
contrast.
CONTRAST:  50 ml omnipaque 300

[Series 1: t abdomen supine · 0.15mm/px · 1 of 1 slices shown (1 of 2)]
[im 1/1]
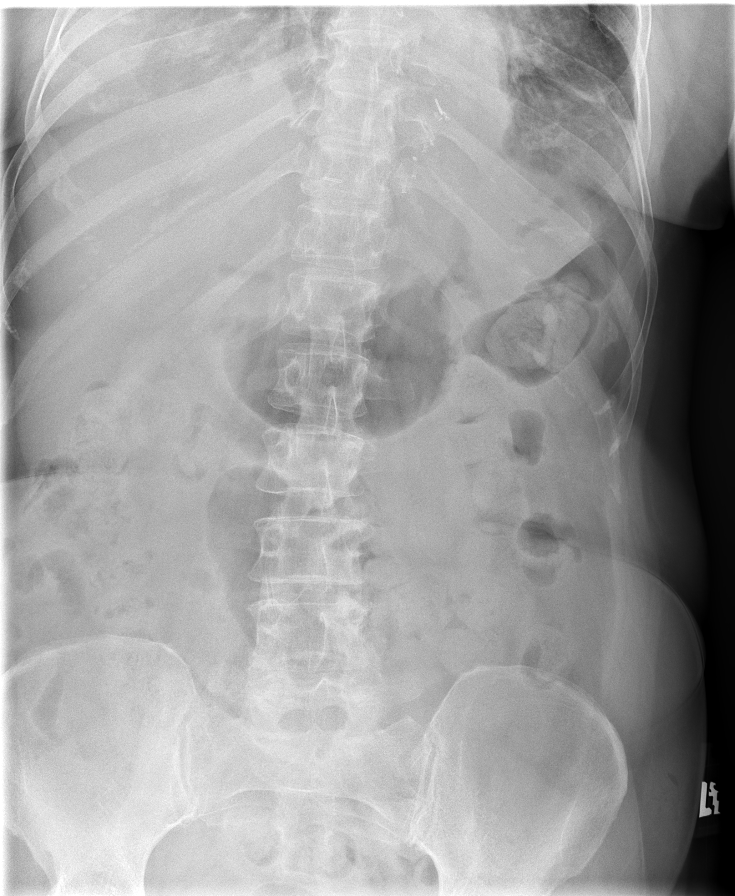

[Series 2: cp_standard · 0.59mm/px · 2 of 161 frames shown (1 of 6)]
[frame 25/161]
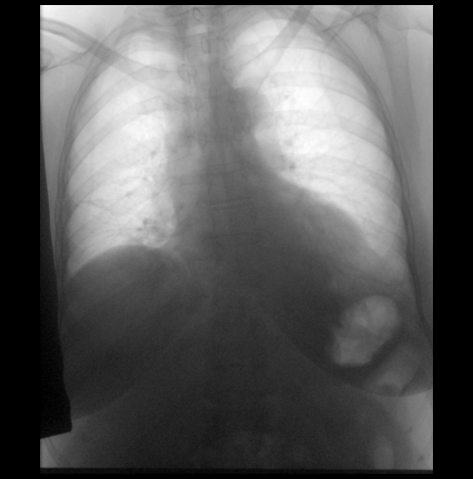
[frame 81/161]
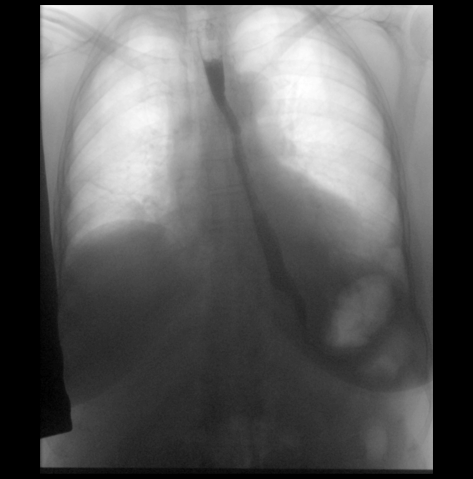

[Series 3: cp_standard · 0.59mm/px · 2 of 7 frames shown (2 of 6)]
[frame 4/7]
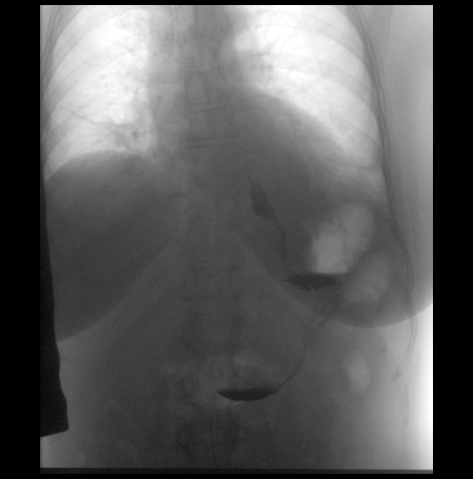
[frame 6/7]
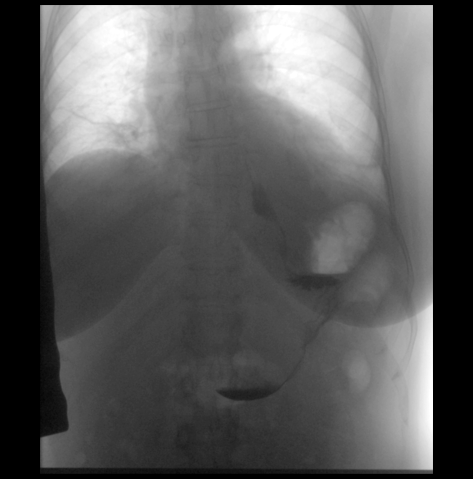

[Series 4: cp_standard · 0.39mm/px · 3 of 146 frames shown (3 of 6)]
[frame 22/146]
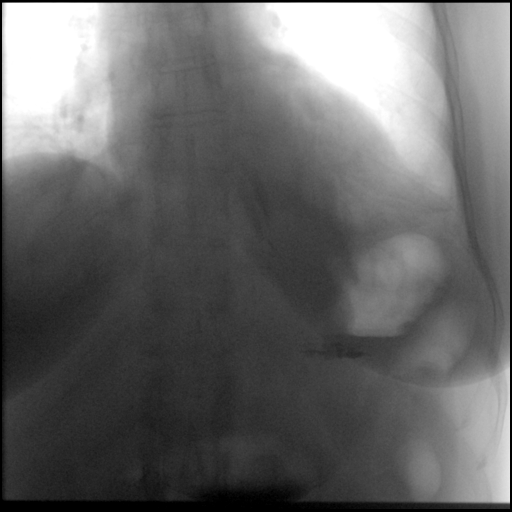
[frame 74/146]
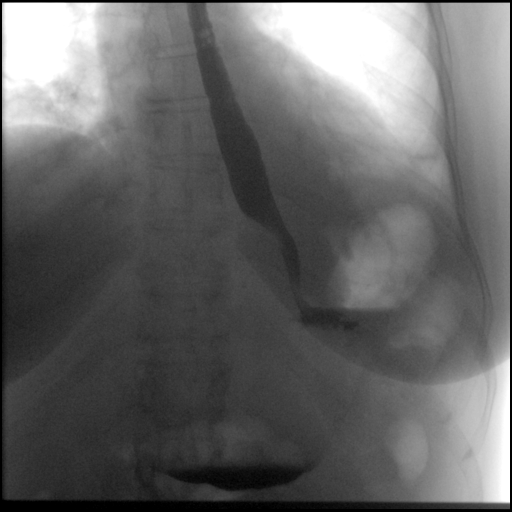
[frame 132/146]
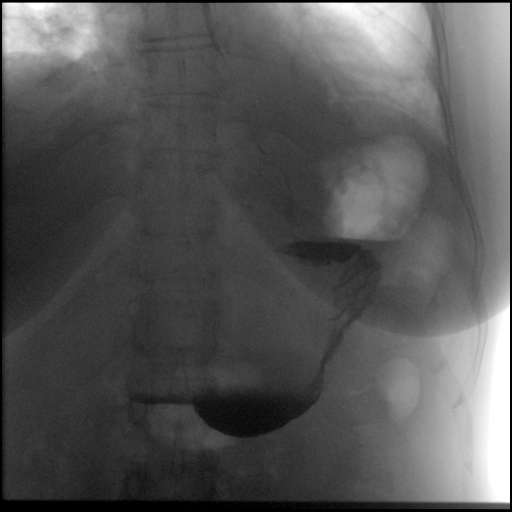

[Series 5: cp_standard · 0.39mm/px · 2 of 54 frames shown (4 of 6)]
[frame 28/54]
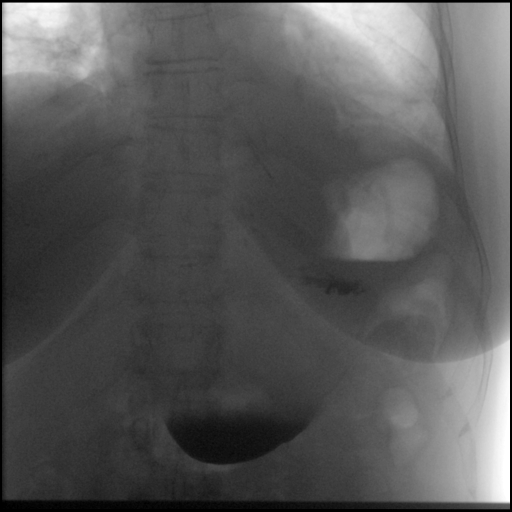
[frame 46/54]
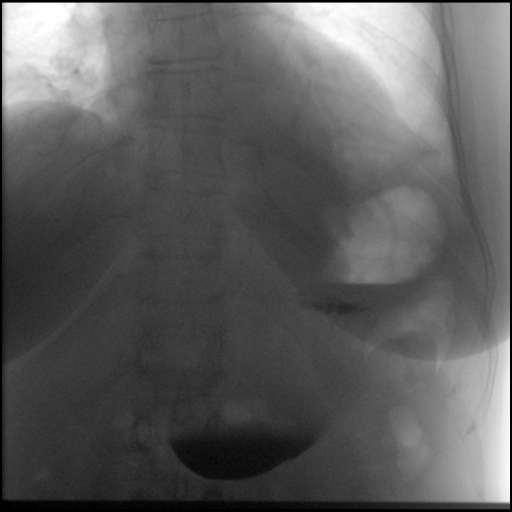

[Series 6: cp_standard · 0.39mm/px · 3 of 115 frames shown (5 of 6)]
[frame 18/115]
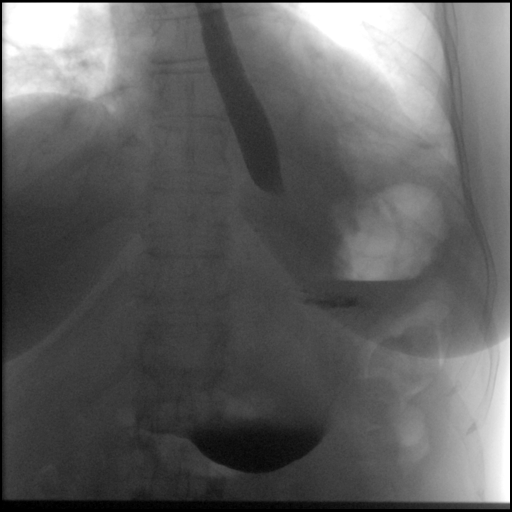
[frame 79/115]
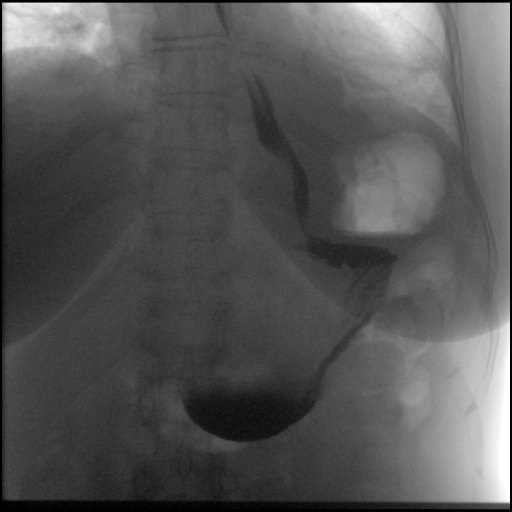
[frame 98/115]
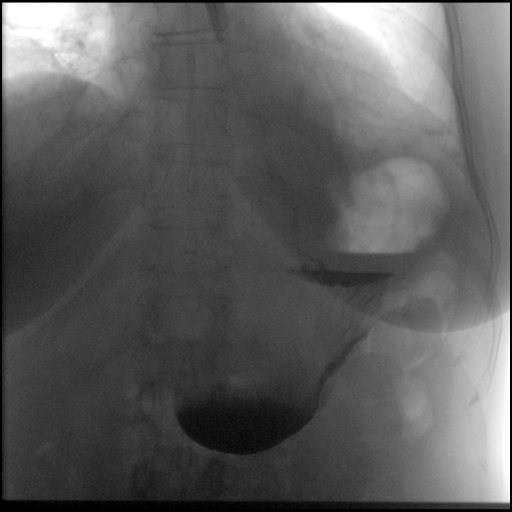

[Series 8: cp_standard · 0.39mm/px · 2 of 124 frames shown (6 of 6)]
[frame 43/124]
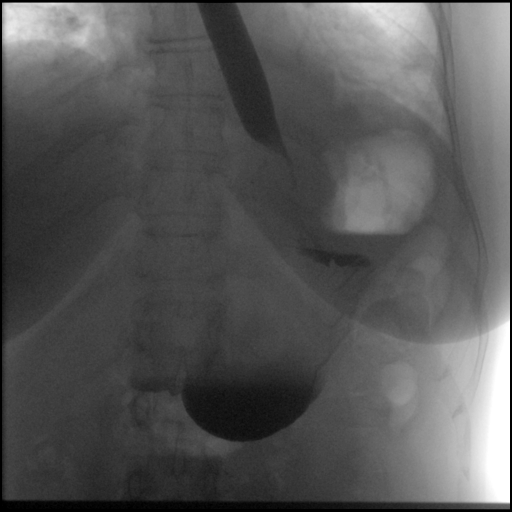
[frame 63/124]
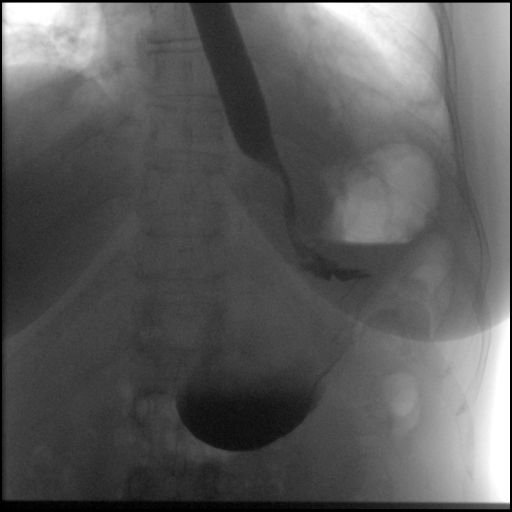

[Series 10: t abdomen supine · 0.15mm/px · 1 of 1 slices shown (2 of 2)]
[im 1/1]
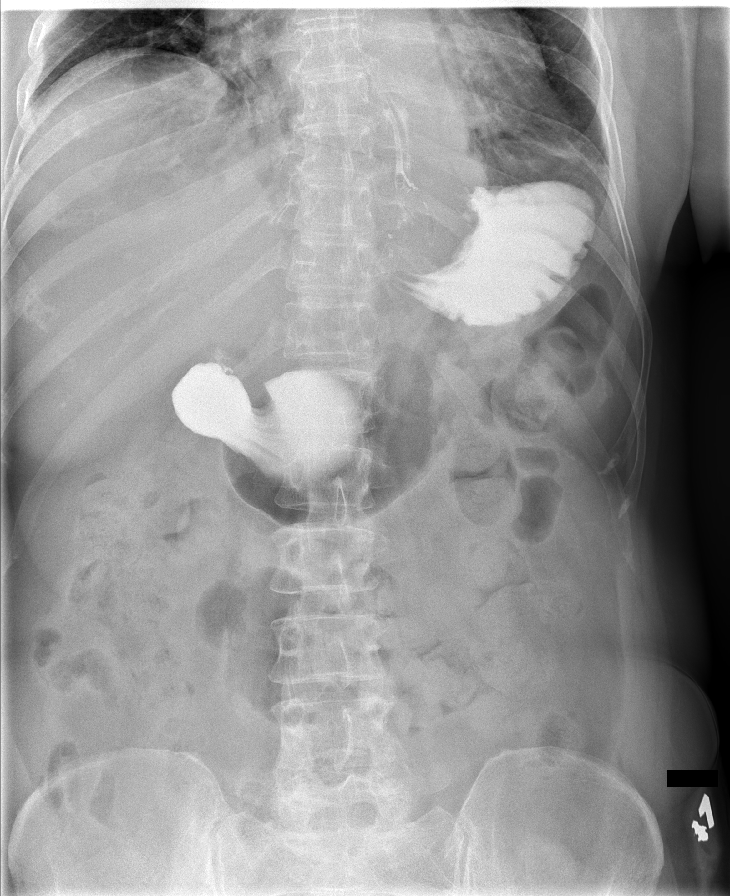

[16 of 24 positions shown; findings below may reference images not displayed]

FLUOROSCOPY TIME:  Fluoroscopy Time: 1.24 sec

Radiation Exposure Index (if provided by the fluoroscopic device):
57 mGy

Number of Acquired Spot Images: 2
FINDINGS: On the scout imaging there are surgical changes in the left upper
quadrant at the site of patient's recent fundoplication.
Water-soluble contrast was administered to the patient orally under
fluoroscopic visualization. There is no evidence of extraluminal
contrast. The contrast material passed easily through the distal
esophagus and into the stomach without evidence of obstruction.

Postprocedure overhead radiograph demonstrates contrast within the
stomach lumen.
IMPRESSION: No evidence for leak or obstruction following Nissen fundoplication.

## 2020-06-16 ENCOUNTER — Other Ambulatory Visit: Payer: 59

## 2020-06-16 ENCOUNTER — Other Ambulatory Visit: Payer: Self-pay

## 2020-06-16 DIAGNOSIS — R7989 Other specified abnormal findings of blood chemistry: Secondary | ICD-10-CM

## 2020-06-18 ENCOUNTER — Other Ambulatory Visit: Payer: Self-pay | Admitting: Physician Assistant

## 2020-06-18 MED ORDER — BUTALBITAL-APAP-CAFFEINE 50-325-40 MG PO TABS: 1.0000 | ORAL_TABLET | Freq: Four times a day (QID) | ORAL | 0 refills | Status: DC | PRN

## 2020-06-22 ENCOUNTER — Ambulatory Visit: Payer: 59 | Admitting: Gastroenterology

## 2020-06-22 ENCOUNTER — Other Ambulatory Visit: Payer: 59

## 2020-06-22 DIAGNOSIS — Z20822 Contact with and (suspected) exposure to covid-19: Secondary | ICD-10-CM

## 2020-06-23 LAB — SARS-COV-2, NAA 2 DAY TAT

## 2020-06-23 LAB — NOVEL CORONAVIRUS, NAA: SARS-CoV-2, NAA: NOT DETECTED

## 2020-07-07 ENCOUNTER — Other Ambulatory Visit: Payer: Self-pay

## 2020-07-07 ENCOUNTER — Ambulatory Visit
Admission: RE | Admit: 2020-07-07 | Discharge: 2020-07-07 | Disposition: A | Discharge status: Home / Self Care | Payer: 59 | Source: Ambulatory Visit | Attending: Internal Medicine | Admitting: Internal Medicine

## 2020-07-07 VITALS — BP 122/79 | HR 78 | Temp 99.0°F | Resp 18 | Ht 64.0 in | Wt 147.0 lb

## 2020-07-07 DIAGNOSIS — R52 Pain, unspecified: Secondary | ICD-10-CM | POA: Diagnosis present

## 2020-07-07 DIAGNOSIS — Z20822 Contact with and (suspected) exposure to covid-19: Secondary | ICD-10-CM | POA: Diagnosis not present

## 2020-07-07 DIAGNOSIS — J029 Acute pharyngitis, unspecified: Secondary | ICD-10-CM | POA: Diagnosis not present

## 2020-07-07 LAB — POCT RAPID STREP A (OFFICE): Rapid Strep A Screen: NEGATIVE

## 2020-07-07 NOTE — ED Provider Notes (Signed)
[name]    CSN: [id number] Arrival date & time: 07/07/20  1245      History   Chief Complaint Chief Complaint  Patient presents with   Sore Throat    HPI Hailey Galvan is a 64 y.o. female.   64 year old female presents with sore throat, headache, chills, runny nose and body aches that started 2 days ago. Also having a cough due to post nasal drainage. Denies any distinct fever or GI symptoms. Has taken Advil with minimal relief. Watched her 1 month old grandson this past weekend who was sick and found out yesterday that he was positive for strep. Requests testing today. No known exposure to COVID 19. Has been vaccinated. Other chronic health issues include hyperlipidemia, Crohn's disease, and mood disorder. Currently on Lipitor, Cymbalta, Concerta and vit R51 daily and Fioricet prn.   The history is provided by the patient.    Past Medical History:  Diagnosis Date   Allergy    Crohn disease ([place])    Depression .   Hiatal hernia    Ileitis     Patient Active Problem List   Diagnosis Date Noted   S/P repair of paraesophageal hernia 12/20/2019   Crohn's disease ([place]) 04/16/2019   Breast mass, right 10/18/2018   Healthcare maintenance 09/06/2018   Ileitis 09/06/2018   Recurrent epistaxis 01/18/2018   Carpal tunnel syndrome on right 08/06/2015   Pain in both hands 08/03/2015   Pseudogout of foot, left 08/04/2014   Right wrist pain 11/19/2013   Urge incontinence 05/30/2013   Chronic, continuous use of opioids 12/05/2012   Neuralgia and neuritis, unspecified 12/05/2012   S/P arthroscopic knee surgery 10/01/2012   Tendonitis of elbow, right 06/20/2012   Pain in left knee 12/08/2011   Hyperlipidemia 11/15/2011    Past Surgical History:  Procedure Laterality Date   ABDOMINAL HYSTERECTOMY     BREAST EXCISIONAL BIOPSY Right    BREAST SURGERY     Tumor Removal   CESAREAN SECTION     COLONOSCOPY  last 03/30/2018   at  [facility]=polyps   KNEE SURGERY     TONSILLECTOMY      OB History   No obstetric history on file.      Home Medications    Prior to Admission medications   Medication Sig Start Date End Date Taking? Authorizing Provider  atorvastatin (LIPITOR) 10 MG tablet TAKE 1 TABLET BY MOUTH ONCE DAILY AT  6  P.M. **PATIENT NEEDS APT FOR FURTHER REFILLS** Patient taking differently: Take 10 mg by mouth daily. TAKE 1 TABLET BY MOUTH ONCE DAILY AT  6  P.M. **PATIENT NEEDS APT FOR FURTHER REFILLS** 10/08/19   Opalski, Neoma Laming, DO  butalbital-acetaminophen-caffeine (FIORICET) 50-325-40 MG tablet Take 1 tablet by mouth every 6 (six) hours as needed. **NEEDS APT FOR FURTHER REFILLS** 06/18/20   Lorrene Reid, PA-C  DULoxetine (CYMBALTA) 60 MG capsule Take 1 capsule (60 mg total) by mouth daily. 10/08/19   Armbruster, [name], MD  Methylphenidate HCl (RITALIN PO) Take 28.7 mg by mouth.    [provider]  vitamin B-12 (CYANOCOBALAMIN) 1000 MCG tablet Take 1,000 mcg by mouth daily.    [provider]  Hyoscyamine Sulfate SL (LEVSIN/SL) 0.125 MG SUBL Place 1 tablet under the tongue every 6 (six) hours as needed. Patient not taking: Reported on 08/13/2019 05/24/19 10/22/19  Yetta Flock, MD    Family History Family History  Problem Relation Age of Onset   Alcohol abuse Mother  Stroke Mother    Diabetes Father    Depression Paternal Grandmother    Colon cancer Neg Hx    Esophageal cancer Neg Hx    Rectal cancer Neg Hx    Stomach cancer Neg Hx    Colon polyps Neg Hx     Social History Social History   Tobacco Use   Smoking status: Current Every Day Smoker    Packs/day: 0.25    Years: 5.00    Pack years: 1.25    Types: Cigarettes   Smokeless tobacco: Never Used  Vaping Use   Vaping Use: Never used  Substance Use Topics   Alcohol use: Yes    Alcohol/week: 14.0 standard drinks    Types: 14 Glasses of wine per week    Comment: every night a glass of wine     Drug use: Never     Allergies   Etodolac, Naproxen, and Zanaflex [tizanidine hcl]   Review of Systems Review of Systems  Constitutional: Positive for activity change, appetite change, chills and fatigue. Negative for fever.  HENT: Positive for congestion, ear pain (bilateral ear pressure), postnasal drip, rhinorrhea, sinus pressure and sore throat. Negative for ear discharge, facial swelling, mouth sores, nosebleeds, sinus pain and trouble swallowing.   Eyes: Negative for pain, discharge, redness and itching.  Respiratory: Positive for cough. Negative for chest tightness, shortness of breath and wheezing.   Gastrointestinal: Negative for diarrhea, nausea and vomiting.  Musculoskeletal: Positive for arthralgias and myalgias. Negative for neck pain and neck stiffness.  Skin: Negative for color change, rash and wound.  Allergic/Immunologic: Positive for food allergies. Negative for environmental allergies.  Neurological: Positive for headaches. Negative for dizziness, tremors, seizures, syncope, speech difficulty and weakness.  Hematological: Negative for adenopathy. Does not bruise/bleed easily.     Physical Exam Triage Vital Signs ED Triage Vitals  Enc Vitals Group     BP 07/07/20 1327 122/79     Pulse Rate 07/07/20 1327 78     Resp 07/07/20 1327 18     Temp 07/07/20 1327 99 F (37.2 C)     Temp Source 07/07/20 1327 Oral     SpO2 07/07/20 1327 95 %     Weight 07/07/20 1327 147 lb (66.7 kg)     Height 07/07/20 1327 5\' 4"  (1.626 m)     Head Circumference --      Peak Flow --      Pain Score 07/07/20 1321 6     Pain Loc --      Pain Edu? --      Excl. in [place]? --    No data found.  Updated Vital Signs BP 122/79 (BP Location: Left Arm)    Pulse 78    Temp 99 F (37.2 C) (Oral)    Resp 18    Ht 5\' 4"  (1.626 m)    Wt 147 lb (66.7 kg)    SpO2 95%    BMI 25.23 kg/m   Visual Acuity Right Eye Distance:   Left Eye Distance:   Bilateral Distance:    Right Eye Near:   Left  Eye Near:    Bilateral Near:     Physical Exam Vitals and nursing note reviewed.  Constitutional:      General: She is awake. She is not in acute distress.    Appearance: She is well-developed and well-groomed. She is ill-appearing.     Comments: She is sitting on the exam chair in no acute distress but appears tired and  ill.   HENT:     Head: Normocephalic and atraumatic.     Right Ear: Hearing, ear canal and external ear normal. Tympanic membrane is bulging. Tympanic membrane is not injected or erythematous.     Left Ear: Hearing, ear canal and external ear normal. Tympanic membrane is bulging. Tympanic membrane is not injected or erythematous.     Nose: Rhinorrhea present. Rhinorrhea is clear.     Right Sinus: No maxillary sinus tenderness or frontal sinus tenderness.     Left Sinus: No maxillary sinus tenderness or frontal sinus tenderness.     Mouth/Throat:     Lips: Pink.     Mouth: Mucous membranes are moist.     Pharynx: Uvula midline. Posterior oropharyngeal erythema present. No pharyngeal swelling, oropharyngeal exudate or uvula swelling.  Eyes:     Extraocular Movements: Extraocular movements intact.     Conjunctiva/sclera: Conjunctivae normal.  Cardiovascular:     Rate and Rhythm: Normal rate and regular rhythm.     Heart sounds: Normal heart sounds. No murmur heard.   Pulmonary:     Effort: Pulmonary effort is normal. No respiratory distress.     Breath sounds: Normal breath sounds and air entry. No stridor or decreased air movement. No decreased breath sounds, wheezing, rhonchi or rales.  Musculoskeletal:        General: Normal range of motion.     Cervical back: Normal range of motion and neck supple. No rigidity.  Lymphadenopathy:     Cervical: Cervical adenopathy present.     Right cervical: Superficial cervical adenopathy present.     Left cervical: Superficial cervical adenopathy present.  Skin:    General: Skin is warm and dry.     Capillary Refill:  Capillary refill takes less than 2 seconds.     Findings: No rash.  Neurological:     General: No focal deficit present.     Mental Status: She is alert and oriented to person, place, and time.  Psychiatric:        Mood and Affect: Mood normal.        Behavior: Behavior normal. Behavior is cooperative.        Thought Content: Thought content normal.        Judgment: Judgment normal.      UC Treatments / Results  Labs (all labs ordered are listed, but only abnormal results are displayed) Labs Reviewed  CULTURE, GROUP A STREP ([place])  NOVEL CORONAVIRUS, NAA  POCT RAPID STREP A (OFFICE)    EKG   Radiology No results found.  Procedures Procedures (including critical care time)  Medications Ordered in UC Medications - No data to display  Initial Impression / Assessment and Plan / UC Course  I have reviewed the triage vital signs and the nursing notes.  Pertinent labs & imaging results that were available during my care of the patient were reviewed by me and considered in my medical decision making (see chart for details).    Reviewed negative rapid strep test result with patient. Will send specimen for strep culture. Discussed that she probably has a viral illness. Recommend continue OTC Advil 600mg  every 8 hours as needed for pain and body aches. Continue to push fluids to stay well hydrated. Rest. Stay at home. Follow-up pending strep culture and COVID 19 test results.  Final Clinical Impressions(s) / UC Diagnoses   Final diagnoses:  Acute sore throat  Body aches  Encounter for screening laboratory testing for COVID-19 virus  Discharge Instructions     Recommend continue Advil 600mg  every 8 hours as needed for pain and body aches. Continue to push fluids to stay well hydrated. Rest. Stay at home. Follow-up pending strep culture and COVID 19 test results.     ED Prescriptions    None     PDMP not reviewed this encounter.   Katy Apo, NP 07/07/20  2050

## 2020-07-07 NOTE — ED Triage Notes (Signed)
Pt c/o sore throat x 2 days with +strep exposure-NAD-steady gait

## 2020-07-07 NOTE — Discharge Instructions (Addendum)
Recommend continue Advil 600mg  every 8 hours as needed for pain and body aches. Continue to push fluids to stay well hydrated. Rest. Stay at home. Follow-up pending strep culture and COVID 19 test results.

## 2020-07-08 LAB — SARS-COV-2, NAA 2 DAY TAT

## 2020-07-08 LAB — NOVEL CORONAVIRUS, NAA: SARS-CoV-2, NAA: NOT DETECTED

## 2020-07-10 LAB — CULTURE, GROUP A STREP (THRC)

## 2020-07-20 ENCOUNTER — Encounter: Payer: Self-pay | Admitting: Physician Assistant

## 2020-07-20 DIAGNOSIS — G43909 Migraine, unspecified, not intractable, without status migrainosus: Secondary | ICD-10-CM

## 2020-07-21 MED ORDER — BUTALBITAL-APAP-CAFFEINE 50-325-40 MG PO TABS: 1.0000 | ORAL_TABLET | Freq: Four times a day (QID) | ORAL | 0 refills | Status: DC | PRN

## 2020-08-25 ENCOUNTER — Encounter: Payer: 59 | Admitting: Physician Assistant

## 2020-10-05 ENCOUNTER — Other Ambulatory Visit: Payer: 59

## 2020-10-05 DIAGNOSIS — Z20822 Contact with and (suspected) exposure to covid-19: Secondary | ICD-10-CM

## 2020-10-06 LAB — NOVEL CORONAVIRUS, NAA: SARS-CoV-2, NAA: NOT DETECTED

## 2020-10-06 LAB — SARS-COV-2, NAA 2 DAY TAT

## 2020-10-16 ENCOUNTER — Other Ambulatory Visit: Payer: Self-pay | Admitting: Gastroenterology

## 2020-12-13 ENCOUNTER — Other Ambulatory Visit: Payer: Self-pay | Admitting: Family Medicine

## 2020-12-13 ENCOUNTER — Other Ambulatory Visit: Payer: Self-pay | Admitting: Gastroenterology

## 2020-12-14 NOTE — Telephone Encounter (Signed)
I can give her a short supply refill but yes would be good to book her a routine follow up with me otherwise. Thanks

## 2020-12-14 NOTE — Telephone Encounter (Signed)
Dr. Loni Muse - patient has not been seen since 07-2019. Should I indicate she needs an appointment first? Thanks

## 2021-01-10 ENCOUNTER — Ambulatory Visit
Admission: RE | Admit: 2021-01-10 | Discharge: 2021-01-10 | Disposition: A | Discharge status: Home / Self Care | Payer: 59 | Source: Ambulatory Visit

## 2021-01-10 ENCOUNTER — Other Ambulatory Visit: Payer: Self-pay

## 2021-01-10 VITALS — BP 145/84 | HR 87 | Temp 97.5°F | Resp 16

## 2021-01-10 DIAGNOSIS — H9202 Otalgia, left ear: Secondary | ICD-10-CM | POA: Diagnosis not present

## 2021-01-10 DIAGNOSIS — J0100 Acute maxillary sinusitis, unspecified: Secondary | ICD-10-CM

## 2021-01-10 HISTORY — DX: Attention-deficit hyperactivity disorder, unspecified type: F90.9

## 2021-01-10 MED ORDER — AMOXICILLIN 875 MG PO TABS: 875.0000 mg | ORAL_TABLET | Freq: Two times a day (BID) | ORAL | 0 refills | Status: AC

## 2021-01-10 MED ORDER — AZELASTINE HCL 0.1 % NA SOLN: 2.0000 | Freq: Two times a day (BID) | NASAL | 12 refills | Status: DC

## 2021-01-10 MED ORDER — PREDNISONE 20 MG PO TABS: 20.0000 mg | ORAL_TABLET | Freq: Every day | ORAL | 0 refills | Status: AC

## 2021-01-10 NOTE — ED Triage Notes (Signed)
C/O left ear pain and drainage x 2 wks.  Denies fevers.

## 2021-01-10 NOTE — ED Provider Notes (Signed)
[name]    CSN: [id number] Arrival date & time: 01/10/21  1137      History   Chief Complaint Chief Complaint  Patient presents with  . Ear Pain  . Appointment 1200    HPI Hailey Galvan is a 65 y.o. female.   Subjective:   Hailey Galvan is a 65 y.o. female who presents for evaluation of possible sinus infection. Symptoms include left ear pressure/pain, congestion, post nasal drip and sinus pressure with no fever, chills, night sweats or weight loss. Onset of symptoms was 2 weeks ago and has been unchanged since that time.  She is drinking plenty of fluids.  Past history is significant for no history of pneumonia or bronchitis. Patient is a non-smoker.  The following portions of the patient's history were reviewed and updated as appropriate: allergies, current medications, past family history, past medical history, past social history, past surgical history and problem list.           Past Medical History:  Diagnosis Date  . ADHD   . Allergy   . Crohn disease ([place])   . Depression .  Hailey Galvan Hiatal hernia   . Ileitis     Patient Active Problem List   Diagnosis Date Noted  . S/P repair of paraesophageal hernia 12/20/2019  . Crohn's disease ([place]) 04/16/2019  . Breast mass, right 10/18/2018  . Healthcare maintenance 09/06/2018  . Ileitis 09/06/2018  . Recurrent epistaxis 01/18/2018  . Carpal tunnel syndrome on right 08/06/2015  . Pain in both hands 08/03/2015  . Pseudogout of foot, left 08/04/2014  . Right wrist pain 11/19/2013  . Urge incontinence 05/30/2013  . Chronic, continuous use of opioids 12/05/2012  . Neuralgia and neuritis, unspecified 12/05/2012  . S/P arthroscopic knee surgery 10/01/2012  . Tendonitis of elbow, right 06/20/2012  . Pain in left knee 12/08/2011  . Hyperlipidemia 11/15/2011    Past Surgical History:  Procedure Laterality Date  . ABDOMINAL HYSTERECTOMY    . BREAST EXCISIONAL BIOPSY Right   . BREAST SURGERY     Tumor  Removal  . CESAREAN SECTION    . COLONOSCOPY  last 03/30/2018   at [facility]=polyps  . KNEE SURGERY    . TONSILLECTOMY      OB History   No obstetric history on file.      Home Medications    Prior to Admission medications   Medication Sig Start Date End Date Taking? Authorizing Provider  amoxicillin (AMOXIL) 875 MG tablet Take 1 tablet (875 mg total) by mouth 2 (two) times daily for 7 days. 01/10/21 01/17/21 Yes Hailey Galvan, Hailey Bar, FNP  atorvastatin (LIPITOR) 10 MG tablet TAKE 1 TABLET BY MOUTH ONCE DAILY AT  6  P.M. **PATIENT NEEDS APT FOR FURTHER REFILLS** Patient taking differently: Take 10 mg by mouth daily. TAKE 1 TABLET BY MOUTH ONCE DAILY AT  6  P.M. **PATIENT NEEDS APT FOR FURTHER REFILLS** 10/08/19  Yes Opalski, Neoma Laming, DO  azelastine (ASTELIN) 0.1 % nasal spray Place 2 sprays into both nostrils 2 (two) times daily. Use in each nostril as directed 01/10/21  Yes Hailey Sack, FNP  BUPROPION HCL PO Take by mouth.   Yes [provider]  DULoxetine (CYMBALTA) 60 MG capsule Take 1 capsule (60 mg total) by mouth daily. Please schedule an office visit for further refills. Thank you 10/16/20  Yes Armbruster, Hailey Raspberry, MD  Methylphenidate HCl (RITALIN PO) Take 28.7 mg by mouth.   Yes [provider]  predniSONE (DELTASONE) 20 MG tablet  Take 1 tablet (20 mg total) by mouth daily for 5 days. 01/10/21 01/15/21 Yes Hailey Sack, FNP  butalbital-acetaminophen-caffeine (FIORICET) 50-325-40 MG tablet Take 1 tablet by mouth every 6 (six) hours as needed. **NEEDS APT FOR FURTHER REFILLS** 07/21/20   Lorrene Reid, PA-C  cyclobenzaprine (FLEXERIL) 10 MG tablet Take 1 tablet (10 mg total) by mouth at bedtime. Please schedule an appointment for further refills. Last seen 07-2019. 12/15/20   Armbruster, Hailey Raspberry, MD  vitamin B-12 (CYANOCOBALAMIN) 1000 MCG tablet Take 1,000 mcg by mouth daily.    [provider]  Hyoscyamine Sulfate SL (LEVSIN/SL) 0.125 MG SUBL Place 1 tablet  under the tongue every 6 (six) hours as needed. Patient not taking: Reported on 08/13/2019 05/24/19 10/22/19  Armbruster, Hailey Raspberry, MD    Family History Family History  Problem Relation Age of Onset  . Alcohol abuse Mother   . Stroke Mother   . Diabetes Father   . Depression Paternal Grandmother   . Colon cancer Neg Hx   . Esophageal cancer Neg Hx   . Rectal cancer Neg Hx   . Stomach cancer Neg Hx   . Colon polyps Neg Hx     Social History Social History   Tobacco Use  . Smoking status: Current Some Day Smoker    Packs/day: 0.25    Years: 5.00    Pack years: 1.25    Types: Cigarettes  . Smokeless tobacco: Never Used  Vaping Use  . Vaping Use: Never used  Substance Use Topics  . Alcohol use: Yes    Alcohol/week: 14.0 standard drinks    Types: 14 Glasses of wine per week  . Drug use: Never     Allergies   Etodolac, Naproxen, and Zanaflex [tizanidine hcl]   Review of Systems Review of Systems  Constitutional: Negative for activity change, fatigue and fever.  HENT: Positive for congestion, ear pain, postnasal drip and sinus pressure. Negative for facial swelling, rhinorrhea, sneezing and sore throat.   Eyes: Negative.   Respiratory: Negative.   Gastrointestinal: Negative for nausea and vomiting.  Musculoskeletal: Negative for myalgias.  Neurological: Negative for headaches.  All other systems reviewed and are negative.    Physical Exam Triage Vital Signs ED Triage Vitals [01/10/21 1227]  Enc Vitals Group     BP (!) 145/84     Pulse Rate 87     Resp 16     Temp (!) 97.5 F (36.4 C)     Temp Source Temporal     SpO2 97 %     Weight      Height      Head Circumference      Peak Flow      Pain Score 6     Pain Loc      Pain Edu?      Excl. in [place]?    No data found.  Updated Vital Signs BP (!) 145/84   Pulse 87   Temp (!) 97.5 F (36.4 C) (Temporal)   Resp 16   SpO2 97%   Visual Acuity Right Eye Distance:   Left Eye Distance:   Bilateral  Distance:    Right Eye Near:   Left Eye Near:    Bilateral Near:     Physical Exam Vitals reviewed.  Constitutional:      General: She is not in acute distress.    Appearance: Normal appearance. She is not ill-appearing, toxic-appearing or diaphoretic.  HENT:     Head: Normocephalic.  Right Ear: Tympanic membrane, ear canal and external ear normal.     Left Ear: Tympanic membrane, ear canal and external ear normal.     Nose:     Right Sinus: Maxillary sinus tenderness present.     Left Sinus: Maxillary sinus tenderness present.     Mouth/Throat:     Mouth: Mucous membranes are moist.  Eyes:     Conjunctiva/sclera: Conjunctivae normal.     Pupils: Pupils are equal, round, and reactive to light.  Cardiovascular:     Rate and Rhythm: Normal rate and regular rhythm.  Pulmonary:     Effort: Pulmonary effort is normal.  Musculoskeletal:        General: Normal range of motion.     Cervical back: Normal range of motion and neck supple.  Lymphadenopathy:     Cervical: No cervical adenopathy.  Skin:    General: Skin is warm and dry.  Neurological:     General: No focal deficit present.     Mental Status: She is alert and oriented to person, place, and time.      UC Treatments / Results  Labs (all labs ordered are listed, but only abnormal results are displayed) Labs Reviewed - No data to display  EKG   Radiology No results found.  Procedures Procedures (including critical care time)  Medications Ordered in UC Medications - No data to display  Initial Impression / Assessment and Plan / UC Course  I have reviewed the triage vital signs and the nursing notes.  Pertinent labs & imaging results that were available during my care of the patient were reviewed by me and considered in my medical decision making (see chart for details).    65 year old female with left otalgia and sinusitis.  She is afebrile.  Nontoxic-appearing.  Physical exam shows maxillary  tenderness.  No other abnormalities noted on exam.  Will treat with amoxicillin, prednisone and Astelin.  Supportive care measures discussed.  Follow-up with primary care if symptoms worsen or persist.  Today's evaluation has revealed no signs of a dangerous process. Discussed diagnosis with patient and/or guardian. Patient and/or guardian aware of their diagnosis, possible red flag symptoms to watch out for and need for close follow up. Patient and/or guardian understands verbal and written discharge instructions. Patient and/or guardian comfortable with plan and disposition.  Patient and/or guardian has a clear mental status at this time, good insight into illness (after discussion and teaching) and has clear judgment to make decisions regarding their care  This care was provided during an unprecedented National Emergency due to the Novel Coronavirus (COVID-19) pandemic. COVID-19 infections and transmission risks place heavy strains on healthcare resources.  As this pandemic evolves, our facility, providers, and staff strive to respond fluidly, to remain operational, and to provide care relative to available resources and information. Outcomes are unpredictable and treatments are without well-defined guidelines. Further, the impact of COVID-19 on all aspects of urgent care, including the impact to patients seeking care for reasons other than COVID-19, is unavoidable during this national emergency. At this time of the global pandemic, management of patients has significantly changed, even for non-COVID positive patients given high local and regional COVID volumes at this time requiring high healthcare system and resource utilization. The standard of care for management of both COVID suspected and non-COVID suspected patients continues to change rapidly at the local, regional, national, and global levels. This patient was worked up and treated to the best available but ever changing evidence and  resources  available at this current time.   Documentation was completed with the aid of voice recognition software. Transcription may contain typographical errors. Final Clinical Impressions(s) / UC Diagnoses   Final diagnoses:  Otalgia of left ear  Acute non-recurrent maxillary sinusitis   Discharge Instructions   None    ED Prescriptions    Medication Sig Dispense Auth. Provider   amoxicillin (AMOXIL) 875 MG tablet Take 1 tablet (875 mg total) by mouth 2 (two) times daily for 7 days. 14 tablet Hailey Sack, FNP   predniSONE (DELTASONE) 20 MG tablet Take 1 tablet (20 mg total) by mouth daily for 5 days. 5 tablet Hailey Sack, FNP   azelastine (ASTELIN) 0.1 % nasal spray Place 2 sprays into both nostrils 2 (two) times daily. Use in each nostril as directed 30 mL Hailey Sack, FNP     PDMP not reviewed this encounter.   Hailey Galvan,  01/10/21 1315

## 2021-03-18 ENCOUNTER — Ambulatory Visit: Payer: Self-pay | Admitting: Nurse Practitioner

## 2021-03-19 ENCOUNTER — Encounter: Payer: Self-pay | Admitting: Family

## 2021-03-19 ENCOUNTER — Ambulatory Visit: Payer: BC Managed Care – PPO | Admitting: Family

## 2021-03-19 ENCOUNTER — Other Ambulatory Visit: Payer: Self-pay

## 2021-03-19 VITALS — BP 128/80 | HR 77 | Temp 97.8°F | Resp 16 | Ht 64.0 in | Wt 153.0 lb

## 2021-03-19 DIAGNOSIS — Z1231 Encounter for screening mammogram for malignant neoplasm of breast: Secondary | ICD-10-CM

## 2021-03-19 DIAGNOSIS — K449 Diaphragmatic hernia without obstruction or gangrene: Secondary | ICD-10-CM | POA: Insufficient documentation

## 2021-03-19 DIAGNOSIS — N951 Menopausal and female climacteric states: Secondary | ICD-10-CM | POA: Diagnosis not present

## 2021-03-19 DIAGNOSIS — Z0000 Encounter for general adult medical examination without abnormal findings: Secondary | ICD-10-CM

## 2021-03-19 DIAGNOSIS — R399 Unspecified symptoms and signs involving the genitourinary system: Secondary | ICD-10-CM

## 2021-03-19 DIAGNOSIS — T7840XA Allergy, unspecified, initial encounter: Secondary | ICD-10-CM | POA: Insufficient documentation

## 2021-03-19 DIAGNOSIS — F321 Major depressive disorder, single episode, moderate: Secondary | ICD-10-CM

## 2021-03-19 DIAGNOSIS — E782 Mixed hyperlipidemia: Secondary | ICD-10-CM | POA: Diagnosis not present

## 2021-03-19 DIAGNOSIS — F909 Attention-deficit hyperactivity disorder, unspecified type: Secondary | ICD-10-CM | POA: Insufficient documentation

## 2021-03-19 DIAGNOSIS — Z780 Asymptomatic menopausal state: Secondary | ICD-10-CM

## 2021-03-19 DIAGNOSIS — F17200 Nicotine dependence, unspecified, uncomplicated: Secondary | ICD-10-CM

## 2021-03-19 DIAGNOSIS — K5000 Crohn's disease of small intestine without complications: Secondary | ICD-10-CM

## 2021-03-19 LAB — POCT URINALYSIS DIPSTICK
Bilirubin, UA: NEGATIVE
Blood, UA: NEGATIVE
Glucose, UA: NEGATIVE
Ketones, UA: NEGATIVE
Nitrite, UA: NEGATIVE
Protein, UA: POSITIVE — AB
Spec Grav, UA: <=1.005 — AB (ref 1.010–1.025)
Urobilinogen, UA: 1 E.U./dL
pH, UA: 6 (ref 5.0–8.0)

## 2021-03-19 NOTE — Progress Notes (Signed)
Provider: Waylin Dorko FNP-C   Hailey Reid, PA-C  Patient Care Team: Ellouise Newer as PCP - General (Physician Assistant) Deno Etienne, MD as Consulting Physician (Anesthesiology) Peri Maris, MD as Referring Physician (Student) Armbruster, [name], MD as Consulting Physician (Gastroenterology)  Extended Emergency Contact Information Primary Emergency Contact: [facility] Address: [address]          [place], [place] [address] Johnnette Litter of [place] Phone: [phone] Mobile Phone: [phone] Relation: Spouse  Code Status:  Full Code  Goals of care: Advanced Directive information Advanced Directives 03/19/2021  Does Patient Have a Medical Advance Directive? No  Would patient like information on creating a medical advance directive? No - Patient declined     Chief Complaint  Patient presents with   Establish Care    New Patient.     HPI:  Pt is a 65 y.o. female seen today to [facility] for medical management of chronic diseases.Has a medical history of Hyperlipidemia,ADHD,Crohn disease,major depression,Hiatal hernia,Allergies,Current cigarette smoker among other condition. Has had pain in on left ear was seen in Urgent Care 01/10/2021.she was treated with Augmentin,Azelastine and Prednisone for Otalgia and maxillary sinusitis.Has a follow up appointment with specialist.states still has pain.Nasal spray has been effective.  Goiter - has had hot flashes at night wonders if the thyroid is acting up again.would like labs checked.States previous provider recommended Endocrinology referral in the past.No swelling or mass noted.   She follows up with [facility] for Depression and ADHD.Denies worsening of symptoms.Ritalin, Bupropion and Cymbalta   Crohn's disease - reports no symptoms.states was dx by Gastroenterologist  No flare ups.  Migraine - Has episodes 2-3 per months.usually has nausea and blurry  vision.Fioricet effective.   Past Medical History:  Diagnosis Date   ADHD    Allergy    Crohn disease ([place])    Depression .   Hiatal hernia    Ileitis    Past Surgical History:  Procedure Laterality Date   ABDOMINAL HYSTERECTOMY     BREAST EXCISIONAL BIOPSY Right    BREAST SURGERY     Tumor Removal   CESAREAN SECTION     COLONOSCOPY  last 03/30/2018   at [facility]=polyps   KNEE SURGERY     TONSILLECTOMY      Allergies  Allergen Reactions   Etodolac Other (See Comments)    Sleepiness, feeling "out of it" Sleepiness, feeling "out of it"    Naproxen Other (See Comments)    "flu like symptoms"   Zanaflex [Tizanidine Hcl] Other (See Comments)    Hallucinations.    Allergies as of 03/19/2021       Reactions   Etodolac Other (See Comments)   Sleepiness, feeling "out of it" Sleepiness, feeling "out of it"   Naproxen Other (See Comments)   "flu like symptoms"   Zanaflex [tizanidine Hcl] Other (See Comments)   Hallucinations.        Medication List        Accurate as of March 19, 2021 11:26 AM. If you have any questions, ask your nurse or doctor.          STOP taking these medications    vitamin B-12 1000 MCG tablet Commonly known as: CYANOCOBALAMIN Stopped by: Sandrea Hughs, NP       TAKE these medications    atorvastatin 10 MG tablet Commonly known as: LIPITOR Take 10 mg by mouth daily. 6pm What changed: Another medication with the same name was removed. Continue taking this  medication, and follow the directions you see here. Changed by: Sandrea Hughs, NP   azelastine 0.1 % nasal spray Commonly known as: ASTELIN Place 2 sprays into both nostrils 2 (two) times daily. Use in each nostril as directed   BUPROPION HCL PO Take by mouth.   butalbital-acetaminophen-caffeine 50-325-40 MG tablet Commonly known as: FIORICET Take 1 tablet by mouth every 6 (six) hours as needed. **NEEDS APT FOR FURTHER REFILLS**   cyclobenzaprine 10 MG tablet Commonly  known as: FLEXERIL Take 10 mg by mouth as needed for muscle spasms. What changed: Another medication with the same name was removed. Continue taking this medication, and follow the directions you see here. Changed by: Sandrea Hughs, NP   DULoxetine 60 MG capsule Commonly known as: CYMBALTA Take 1 capsule (60 mg total) by mouth daily. Please schedule an office visit for further refills. Thank you   RITALIN PO Take 28.7 mg by mouth.        Review of Systems  Constitutional:  Negative for appetite change, chills, fatigue, fever and unexpected weight change.  HENT:  Positive for ear pain. Negative for congestion, dental problem, ear discharge, facial swelling, hearing loss, nosebleeds, postnasal drip, rhinorrhea, sinus pressure, sinus pain, sneezing, sore throat, tinnitus and trouble swallowing.        Chronic ear pain   Eyes:  Negative for pain, discharge, redness, itching and visual disturbance.  Respiratory:  Negative for cough, chest tightness, shortness of breath and wheezing.   Cardiovascular:  Negative for chest pain, palpitations and leg swelling.  Gastrointestinal:  Negative for abdominal distention, abdominal pain, blood in stool, constipation, diarrhea, nausea and vomiting.  Endocrine: Negative for cold intolerance, heat intolerance, polydipsia, polyphagia and polyuria.       Hot flashes   Genitourinary:  Negative for difficulty urinating, dysuria, flank pain, frequency and urgency.  Musculoskeletal:  Negative for arthralgias, back pain, gait problem, joint swelling, myalgias, neck pain and neck stiffness.       On and off spasm on right hip   Skin:  Negative for color change, pallor, rash and wound.  Neurological:  Positive for headaches. Negative for dizziness, syncope, speech difficulty, weakness, light-headedness and numbness.       Migraine   Hematological:  Does not bruise/bleed easily.  Psychiatric/Behavioral:  Negative for agitation, behavioral problems, confusion,  hallucinations, self-injury, sleep disturbance and suicidal ideas. The patient is not nervous/anxious.        ADHD    Immunization History  Administered Date(s) Administered   Influenza,inj,Quad PF,6+ Mos 05/29/2018   Influenza-Unspecified 06/23/2011, 06/20/2012, 06/08/2015, 05/22/2016, 07/12/2019, 07/23/2020   PFIZER(Purple Top)SARS-COV-2 Vaccination 11/07/2019, 11/28/2019   Pneumococcal Polysaccharide-23 11/16/2016   Tdap 02/19/2006, 11/16/2016   Zoster Recombinat (Shingrix) 08/30/2017   Pertinent  Health Maintenance Due  Topic Date Due   DEXA SCAN  Never done   PNA vac Low Risk Adult (1 of 2 - PCV13) 09/13/2020   MAMMOGRAM  10/28/2020   INFLUENZA VACCINE  03/22/2021   COLONOSCOPY (Pts 45-67yr Insurance coverage will need to be confirmed)  08/18/2029   PAP SMEAR-Modifier  Discontinued   Fall Risk  03/19/2021 04/20/2020 09/06/2018  Falls in the past year? 0 0 0  Number falls in past yr: 0 - -  Injury with Fall? 0 - -  Risk for fall due to : No Fall Risks - -  Follow up Falls evaluation completed Falls evaluation completed -   Functional Status Survey:    Vitals:   03/19/21 1103  BP:  128/80  Pulse: 77  Resp: 16  Temp: 97.8 F (36.6 C)  SpO2: 98%  Weight: 153 lb (69.4 kg)  Height: 5' 4"  (1.626 m)   Body mass index is 26.26 kg/m. Physical Exam Vitals reviewed.  Constitutional:      General: She is not in acute distress.    Appearance: Normal appearance. She is overweight. She is not ill-appearing or diaphoretic.  HENT:     Head: Normocephalic.     Right Ear: Tympanic membrane, ear canal and external ear normal. There is no impacted cerumen.     Left Ear: Tympanic membrane, ear canal and external ear normal. There is no impacted cerumen.     Nose: Nose normal. No congestion or rhinorrhea.     Mouth/Throat:     Mouth: Mucous membranes are moist.     Pharynx: Oropharynx is clear. No oropharyngeal exudate or posterior oropharyngeal erythema.  Eyes:     General: No  scleral icterus.       Right eye: No discharge.        Left eye: No discharge.     Extraocular Movements: Extraocular movements intact.     Conjunctiva/sclera: Conjunctivae normal.     Pupils: Pupils are equal, round, and reactive to light.  Neck:     Vascular: No carotid bruit.  Cardiovascular:     Rate and Rhythm: Normal rate and regular rhythm.     Pulses: Normal pulses.     Heart sounds: Normal heart sounds. No murmur heard.   No friction rub. No gallop.  Pulmonary:     Effort: Pulmonary effort is normal. No respiratory distress.     Breath sounds: Normal breath sounds. No wheezing, rhonchi or rales.  Chest:     Chest wall: No tenderness.  Abdominal:     General: Bowel sounds are normal. There is no distension.     Palpations: Abdomen is soft. There is no mass.     Tenderness: There is no abdominal tenderness. There is no right CVA tenderness, left CVA tenderness, guarding or rebound.  Musculoskeletal:        General: No swelling or tenderness. Normal range of motion.     Cervical back: Normal range of motion. No rigidity or tenderness.     Right lower leg: No edema.     Left lower leg: No edema.  Lymphadenopathy:     Cervical: No cervical adenopathy.  Skin:    General: Skin is warm and dry.     Coloration: Skin is not pale.     Findings: No bruising, erythema, lesion or rash.  Neurological:     Mental Status: She is alert and oriented to person, place, and time.     Cranial Nerves: No cranial nerve deficit.     Sensory: No sensory deficit.     Motor: No weakness.     Coordination: Coordination normal.     Gait: Gait normal.  Psychiatric:        Mood and Affect: Mood normal.        Speech: Speech normal.        Behavior: Behavior normal.        Thought Content: Thought content normal.        Judgment: Judgment normal.    Labs reviewed: Recent Labs    04/20/20 1413  NA 138  K 4.6  CL 99  CO2 23  GLUCOSE 114*  BUN 10  CREATININE 0.72  CALCIUM 10.0    Recent Labs  04/20/20 1413 05/25/20 1242  AST 39 30  ALT 49* 37*  ALKPHOS 155* 108  BILITOT 0.3 0.6  PROT 7.9 7.8  ALBUMIN 5.0* 4.7   Recent Labs    04/20/20 1413  WBC 8.8  HGB 13.4  HCT 39.0  MCV 92  PLT 322   Lab Results  Component Value Date   TSH 1.770 04/20/2020   Lab Results  Component Value Date   HGBA1C 5.5 04/24/2019   Lab Results  Component Value Date   CHOL 251 (H) 04/24/2019   HDL 78 04/24/2019   LDLCALC 147 (H) 04/24/2019   TRIG 148 04/24/2019   CHOLHDL 3.2 04/24/2019    Significant Diagnostic Results in last 30 days:  No results found.  Assessment/Plan 1. Encounter for medical examination to establish care Available medical records reviewed.Immunization updated. - CBC with Differential/Platelet; Future - CMP with eGFR([facility]); Future - TSH; Future - Lipid panel; Future  2. Mixed hyperlipidemia Latest LDL 147 not at goal.  - Lipid panel; Future  3. Hot flushes, perimenopausal Worst at night. Will check TSH level   4. Current moderate episode of major depressive disorder, unspecified whether recurrent (HCC) Mood stable. Continue on Bupropion and Cymbalta  - continue to follow up with [facility]  - TSH; Future  5. Attention deficit hyperactivity disorder (ADHD), unspecified ADHD type Continue on Ritalin managed by [facility]   6. Symptoms of urinary tract infection Afebrile. Reports urethra discharge - POC Urinalysis Dipstick indicates yellow clear urine with large 3+ Leukocytes but negative for Nitrites.will send to for culture then will treat with antibiotics if indicated. - Encouraged to increase water intake 6-8 glasses daily.  - Urine Culture  7. Current smoker Smokes three cigarettes per week has quit in the past but not ready.   8. Crohn's disease of small intestine without complication ([place]) Asymptomatic. Continue to follow up with Gastroenterology as directed.  - CBC with  Differential/Platelet; Future - CMP with eGFR([facility]); Future  9. Hiatal hernia Symptoms improved after surgery   10. Postmenopausal estrogen deficiency No recent fall or fracture. - continue weight bearing exercise. - Bone density ordered made aware imaging place will call for appointment - DG Bone Density; Future  11. Breast cancer screening by mammogram Asymptomatic  made aware [facility] will call for appointment - MM DIGITAL SCREENING BILATERAL; Future   Family/ staff Communication: Reviewed plan of care with patient verbalized Understanding   Labs/tests ordered:  - CBC with Differential/Platelet - CMP with eGFR([facility]) - TSH - Lipid panel  Next Appointment : 1 week for Annual Physical Examination and Fasting labs   Sandrea Hughs, NP

## 2021-03-20 LAB — URINE CULTURE
MICRO NUMBER:: 12181922
SPECIMEN QUALITY:: ADEQUATE

## 2021-03-23 ENCOUNTER — Other Ambulatory Visit: Payer: Self-pay

## 2021-03-23 DIAGNOSIS — R399 Unspecified symptoms and signs involving the genitourinary system: Secondary | ICD-10-CM

## 2021-03-24 ENCOUNTER — Other Ambulatory Visit: Payer: BC Managed Care – PPO

## 2021-03-24 ENCOUNTER — Other Ambulatory Visit: Payer: Self-pay

## 2021-03-24 DIAGNOSIS — F321 Major depressive disorder, single episode, moderate: Secondary | ICD-10-CM

## 2021-03-24 DIAGNOSIS — H6992 Unspecified Eustachian tube disorder, left ear: Secondary | ICD-10-CM | POA: Insufficient documentation

## 2021-03-24 DIAGNOSIS — F331 Major depressive disorder, recurrent, moderate: Secondary | ICD-10-CM | POA: Diagnosis not present

## 2021-03-24 DIAGNOSIS — F900 Attention-deficit hyperactivity disorder, predominantly inattentive type: Secondary | ICD-10-CM | POA: Diagnosis not present

## 2021-03-24 DIAGNOSIS — E782 Mixed hyperlipidemia: Secondary | ICD-10-CM | POA: Diagnosis not present

## 2021-03-24 DIAGNOSIS — H6982 Other specified disorders of Eustachian tube, left ear: Secondary | ICD-10-CM | POA: Diagnosis not present

## 2021-03-24 DIAGNOSIS — K5000 Crohn's disease of small intestine without complications: Secondary | ICD-10-CM | POA: Diagnosis not present

## 2021-03-24 DIAGNOSIS — Z0000 Encounter for general adult medical examination without abnormal findings: Secondary | ICD-10-CM

## 2021-03-24 DIAGNOSIS — J342 Deviated nasal septum: Secondary | ICD-10-CM | POA: Insufficient documentation

## 2021-03-24 DIAGNOSIS — R399 Unspecified symptoms and signs involving the genitourinary system: Secondary | ICD-10-CM | POA: Diagnosis not present

## 2021-03-24 DIAGNOSIS — H9190 Unspecified hearing loss, unspecified ear: Secondary | ICD-10-CM | POA: Diagnosis not present

## 2021-03-24 DIAGNOSIS — F902 Attention-deficit hyperactivity disorder, combined type: Secondary | ICD-10-CM | POA: Diagnosis not present

## 2021-03-24 DIAGNOSIS — Z79899 Other long term (current) drug therapy: Secondary | ICD-10-CM | POA: Diagnosis not present

## 2021-03-25 LAB — COMPLETE METABOLIC PANEL WITH GFR
AG Ratio: 1.5 (calc) (ref 1.0–2.5)
ALT: 45 U/L — ABNORMAL HIGH (ref 6–29)
AST: 32 U/L (ref 10–35)
Albumin: 4.7 g/dL (ref 3.6–5.1)
Alkaline phosphatase (APISO): 113 U/L (ref 37–153)
BUN: 11 mg/dL (ref 7–25)
CO2: 26 mmol/L (ref 20–32)
Calcium: 9.6 mg/dL (ref 8.6–10.4)
Chloride: 102 mmol/L (ref 98–110)
Creat: 0.77 mg/dL (ref 0.50–1.05)
Globulin: 3.1 g/dL (calc) (ref 1.9–3.7)
Glucose, Bld: 96 mg/dL (ref 65–99)
Potassium: 4.5 mmol/L (ref 3.5–5.3)
Sodium: 140 mmol/L (ref 135–146)
Total Bilirubin: 0.6 mg/dL (ref 0.2–1.2)
Total Protein: 7.8 g/dL (ref 6.1–8.1)
eGFR: 86 mL/min/{1.73_m2} (ref >=60)

## 2021-03-25 LAB — LIPID PANEL
Cholesterol: 288 mg/dL — ABNORMAL HIGH (ref <200)
HDL: 82 mg/dL (ref >=50)
LDL Cholesterol (Calc): 170 mg/dL (calc) — ABNORMAL HIGH
Non-HDL Cholesterol (Calc): 206 mg/dL (calc) — ABNORMAL HIGH (ref <130)
Total CHOL/HDL Ratio: 3.5 (calc) (ref <5.0)
Triglycerides: 203 mg/dL — ABNORMAL HIGH (ref <150)

## 2021-03-25 LAB — CBC WITH DIFFERENTIAL/PLATELET
Absolute Monocytes: 560 cells/uL (ref 200–950)
Basophils Absolute: 64 cells/uL (ref 0–200)
Basophils Relative: 0.8 %
Eosinophils Absolute: 288 cells/uL (ref 15–500)
Eosinophils Relative: 3.6 %
HCT: 40 % (ref 35.0–45.0)
Hemoglobin: 13.6 g/dL (ref 11.7–15.5)
Lymphs Abs: 2144 cells/uL (ref 850–3900)
MCH: 32 pg (ref 27.0–33.0)
MCHC: 34 g/dL (ref 32.0–36.0)
MCV: 94.1 fL (ref 80.0–100.0)
MPV: 9.7 fL (ref 7.5–12.5)
Monocytes Relative: 7 %
Neutro Abs: 4944 cells/uL (ref 1500–7800)
Neutrophils Relative %: 61.8 %
Platelets: 349 10*3/uL (ref 140–400)
RBC: 4.25 10*6/uL (ref 3.80–5.10)
RDW: 12.3 % (ref 11.0–15.0)
Total Lymphocyte: 26.8 %
WBC: 8 10*3/uL (ref 3.8–10.8)

## 2021-03-25 LAB — TSH: TSH: 1.69 mIU/L (ref 0.40–4.50)

## 2021-03-25 LAB — URINE CULTURE
MICRO NUMBER:: 12198483
SPECIMEN QUALITY:: ADEQUATE

## 2021-03-28 ENCOUNTER — Other Ambulatory Visit: Payer: Self-pay | Admitting: Family

## 2021-03-28 DIAGNOSIS — E782 Mixed hyperlipidemia: Secondary | ICD-10-CM

## 2021-03-28 MED ORDER — ATORVASTATIN CALCIUM 10 MG PO TABS: 10.0000 mg | ORAL_TABLET | Freq: Every day | ORAL | 3 refills | Status: DC

## 2021-03-29 ENCOUNTER — Other Ambulatory Visit: Payer: Self-pay

## 2021-03-29 ENCOUNTER — Encounter: Payer: Self-pay | Admitting: Family

## 2021-03-29 ENCOUNTER — Ambulatory Visit: Payer: BC Managed Care – PPO | Admitting: Family

## 2021-03-29 DIAGNOSIS — Z79899 Other long term (current) drug therapy: Secondary | ICD-10-CM

## 2021-03-29 DIAGNOSIS — E782 Mixed hyperlipidemia: Secondary | ICD-10-CM

## 2021-03-30 ENCOUNTER — Other Ambulatory Visit: Payer: Self-pay

## 2021-03-30 ENCOUNTER — Ambulatory Visit: Payer: BC Managed Care – PPO | Admitting: Family

## 2021-03-30 VITALS — BP 124/88 | HR 84 | Temp 97.1°F | Resp 16 | Ht 64.0 in | Wt 154.4 lb

## 2021-03-30 DIAGNOSIS — L84 Corns and callosities: Secondary | ICD-10-CM | POA: Diagnosis not present

## 2021-03-30 DIAGNOSIS — Z0001 Encounter for general adult medical examination with abnormal findings: Secondary | ICD-10-CM | POA: Diagnosis not present

## 2021-03-30 DIAGNOSIS — G43909 Migraine, unspecified, not intractable, without status migrainosus: Secondary | ICD-10-CM | POA: Diagnosis not present

## 2021-03-30 DIAGNOSIS — E782 Mixed hyperlipidemia: Secondary | ICD-10-CM

## 2021-03-30 DIAGNOSIS — F909 Attention-deficit hyperactivity disorder, unspecified type: Secondary | ICD-10-CM

## 2021-03-30 MED ORDER — BUTALBITAL-APAP-CAFFEINE 50-325-40 MG PO TABS: 1.0000 | ORAL_TABLET | Freq: Four times a day (QID) | ORAL | 0 refills | Status: DC | PRN

## 2021-03-30 NOTE — Patient Instructions (Addendum)
Health Maintenance, Female Adopting a healthy lifestyle and getting preventive care are important in promoting health and wellness. Ask your health care provider about: The right schedule for you to have regular tests and exams. Things you can do on your own to prevent diseases and keep yourself healthy. What should I know about diet, weight, and exercise? Eat a healthy diet  Eat a diet that includes plenty of vegetables, fruits, low-fat dairy products, and lean protein. Do not eat a lot of foods that are high in solid fats, added sugars, or sodium.  Maintain a healthy weight Body mass index (BMI) is used to identify weight problems. It estimates body fat based on height and weight. Your health care provider can help determineyour BMI and help you achieve or maintain a healthy weight. Get regular exercise Get regular exercise. This is one of the most important things you can do for your health. Most adults should: Exercise for at least 150 minutes each week. The exercise should increase your heart rate and make you sweat (moderate-intensity exercise). Do strengthening exercises at least twice a week. This is in addition to the moderate-intensity exercise. Spend less time sitting. Even light physical activity can be beneficial. Watch cholesterol and blood lipids Have your blood tested for lipids and cholesterol at 65 years of age, then havethis test every 5 years. Have your cholesterol levels checked more often if: Your lipid or cholesterol levels are high. You are older than 65 years of age. You are at high risk for heart disease. What should I know about cancer screening? Depending on your health history and family history, you may need to have cancer screening at various ages. This may include screening for: Breast cancer. Cervical cancer. Colorectal cancer. Skin cancer. Lung cancer. What should I know about heart disease, diabetes, and high blood pressure? Blood pressure and  heart disease High blood pressure causes heart disease and increases the risk of stroke. This is more likely to develop in people who have high blood pressure readings, are of African descent, or are overweight. Have your blood pressure checked: Every 3-5 years if you are 27-57 years of age. Every year if you are 61 years old or older. Diabetes Have regular diabetes screenings. This checks your fasting blood sugar level. Have the screening done: Once every three years after age 108 if you are at a normal weight and have a low risk for diabetes. More often and at a younger age if you are overweight or have a high risk for diabetes. What should I know about preventing infection? Hepatitis B If you have a higher risk for hepatitis B, you should be screened for this virus. Talk with your health care provider to find out if you are at risk forhepatitis B infection. Hepatitis C Testing is recommended for: Everyone born from [birth year] through 1965. Anyone with known risk factors for hepatitis C. Sexually transmitted infections (STIs) Get screened for STIs, including gonorrhea and chlamydia, if: You are sexually active and are younger than 65 years of age. You are older than 65 years of age and your health care provider tells you that you are at risk for this type of infection. Your sexual activity has changed since you were last screened, and you are at increased risk for chlamydia or gonorrhea. Ask your health care provider if you are at risk. Ask your health care provider about whether you are at high risk for HIV. Your health care provider may recommend a prescription medicine to  help prevent HIV infection. If you choose to take medicine to prevent HIV, you should first get tested for HIV. You should then be tested every 3 months for as long as you are taking the medicine. Pregnancy If you are about to stop having your period (premenopausal) and you may become pregnant, seek counseling before you get  pregnant. Take 400 to 800 micrograms (mcg) of folic acid every day if you become pregnant. Ask for birth control (contraception) if you want to prevent pregnancy. Osteoporosis and menopause Osteoporosis is a disease in which the bones lose minerals and strength with aging. This can result in bone fractures. If you are 65 years old or older, or if you are at risk for osteoporosis and fractures, ask your health care provider if you should: Be screened for bone loss. Take a calcium or vitamin D supplement to lower your risk of fractures. Be given hormone replacement therapy (HRT) to treat symptoms of menopause. Follow these instructions at home: Lifestyle Do not use any products that contain nicotine or tobacco, such as cigarettes, e-cigarettes, and chewing tobacco. If you need help quitting, ask your health care provider. Do not use street drugs. Do not share needles. Ask your health care provider for help if you need support or information about quitting drugs. Alcohol use Do not drink alcohol if: Your health care provider tells you not to drink. You are pregnant, may be pregnant, or are planning to become pregnant. If you drink alcohol: Limit how much you use to 0-1 drink a day. Limit intake if you are breastfeeding. Be aware of how much alcohol is in your drink. In the [place] one drink equals one 12 oz bottle of beer (355 mL), one 5 oz glass of wine (148 mL), or one 1 oz glass of hard liquor (44 mL). General instructions Schedule regular health, dental, and eye exams. Stay current with your vaccines. Tell your health care provider if: You often feel depressed. You have ever been abused or do not feel safe at home. Summary Adopting a healthy lifestyle and getting preventive care are important in promoting health and wellness. Follow your health care provider's instructions about healthy diet, exercising, and getting tested or screened for diseases. Follow your health care provider's  instructions on monitoring your cholesterol and blood pressure. This information is not intended to replace advice given to you by your health care provider. Make sure you discuss any questions you have with your healthcare provider. Document Revised: 08/01/2018 Document Reviewed: 08/01/2018 Elsevier Patient Education  2022 [place].     Why follow it? Research shows. Those who follow the Mediterranean diet have a reduced risk of heart disease  The diet is associated with a reduced incidence of Parkinson's and Alzheimer's diseases People following the diet may have longer life expectancies and lower rates of chronic diseases  The Dietary Guidelines for Americans recommends the Mediterranean diet as an eating plan to promote health and prevent disease  What Is the Mediterranean Diet?  Healthy eating plan based on typical foods and recipes of Mediterranean-style cooking The diet is primarily a plant based diet; these foods should make up a majority of meals   Starches - Plant based foods should make up a majority of meals - They are an important sources of vitamins, minerals, energy, antioxidants, and fiber - Choose whole grains, foods high in fiber and minimally processed items  - Typical grain sources include wheat, oats, barley, corn, brown rice, bulgar, farro, millet, polenta, couscous  -  Various types of beans include chickpeas, lentils, fava beans, black beans, white beans   Fruits  Veggies - Large quantities of antioxidant rich fruits & veggies; 6 or more servings  - Vegetables can be eaten raw or lightly drizzled with oil and cooked  - Vegetables common to the traditional Mediterranean Diet include: artichokes, arugula, beets, broccoli, brussel sprouts, cabbage, carrots, celery, collard greens, cucumbers, eggplant, kale, leeks, lemons, lettuce, mushrooms, okra, onions, peas, peppers, potatoes, pumpkin, radishes, rutabaga, shallots, spinach, sweet potatoes, turnips, zucchini - Fruits  common to the Mediterranean Diet include: apples, apricots, avocados, cherries, clementines, dates, figs, grapefruits, grapes, melons, nectarines, oranges, peaches, pears, pomegranates, strawberries, tangerines  Fats - Replace butter and margarine with healthy oils, such as olive oil, canola oil, and tahini  - Limit nuts to no more than a handful a day  - Nuts include walnuts, almonds, pecans, pistachios, pine nuts  - Limit or avoid candied, honey roasted or heavily salted nuts - Olives are central to the [employer] - can be eaten whole or used in a variety of dishes   Meats Protein - Limiting red meat: no more than a few times a month - When eating red meat: choose lean cuts and keep the portion to the size of deck of cards - Eggs: approx. 0 to 4 times a week  - Fish and lean poultry: at least 2 a week  - Healthy protein sources include, chicken, [place], lean beef, lamb - Increase intake of seafood such as tuna, salmon, trout, mackerel, shrimp, scallops - Avoid or limit high fat processed meats such as sausage and bacon  Dairy - Include moderate amounts of low fat dairy products  - Focus on healthy dairy such as fat free yogurt, skim milk, low or reduced fat cheese - Limit dairy products higher in fat such as whole or 2% milk, cheese, ice cream  Alcohol - Moderate amounts of red wine is ok  - No more than 5 oz daily for women (all ages) and men older than age 84  - No more than 10 oz of wine daily for men younger than 54  Other - Limit sweets and other desserts  - Use herbs and spices instead of salt to flavor foods  - Herbs and spices common to the traditional Mediterranean Diet include: basil, bay leaves, chives, cloves, cumin, fennel, garlic, lavender, marjoram, mint, oregano, parsley, pepper, rosemary, sage, savory, sumac, tarragon, thyme   It's not just a diet, it's a lifestyle:  The Mediterranean diet includes lifestyle factors typical of those in the region  Foods, drinks and  meals are best eaten with others and savored Daily physical activity is important for overall good health This could be strenuous exercise like running and aerobics This could also be more leisurely activities such as walking, housework, yard-work, or taking the stairs Moderation is the key; a balanced and healthy diet accommodates most foods and drinks Consider portion sizes and frequency of consumption of certain foods   Meal Ideas & Options:  Breakfast:  Whole wheat toast or whole wheat English muffins with peanut butter & hard boiled egg Steel cut oats topped with apples & cinnamon and skim milk  Fresh fruit: banana, strawberries, melon, berries, peaches  Smoothies: strawberries, bananas, greek yogurt, peanut butter Low fat greek yogurt with blueberries and granola  Egg white omelet with spinach and mushrooms Breakfast couscous: whole wheat couscous, apricots, skim milk, cranberries  Sandwiches:  Hummus and grilled vegetables (peppers, zucchini, squash) on whole wheat  bread   Grilled chicken on whole wheat pita with lettuce, tomatoes, cucumbers or tzatziki  [place] salad on whole wheat bread: tuna salad made with greek yogurt, olives, red peppers, capers, green onions Garlic rosemary lamb pita: lamb sauted with garlic, rosemary, salt & pepper; add lettuce, cucumber, greek yogurt to pita - flavor with lemon juice and black pepper  Seafood:  Mediterranean grilled salmon, seasoned with garlic, basil, parsley, lemon juice and black pepper Shrimp, lemon, and spinach whole-grain pasta salad made with low fat greek yogurt  Seared scallops with lemon orzo  Seared tuna steaks seasoned salt, pepper, coriander topped with tomato mixture of olives, tomatoes, olive oil, minced garlic, parsley, green onions and cappers  Meats:  Herbed greek chicken salad with kalamata olives, cucumber, feta  Red bell peppers stuffed with spinach, bulgur, lean ground beef (or lentils) & topped with feta   Kebabs:  skewers of chicken, tomatoes, onions, zucchini, squash  [place] burgers: made with red onions, mint, dill, lemon juice, feta cheese topped with roasted red peppers Vegetarian Cucumber salad: cucumbers, artichoke hearts, celery, red onion, feta cheese, tossed in olive oil & lemon juice  Hummus and whole grain pita points with a greek salad (lettuce, tomato, feta, olives, cucumbers, red onion) Lentil soup with celery, carrots made with vegetable broth, garlic, salt and pepper  Tabouli salad: parsley, bulgur, mint, scallions, cucumbers, tomato, radishes, lemon juice, olive oil, salt and pepper.

## 2021-03-30 NOTE — Progress Notes (Signed)
Provider: Marlowe Sax FNP-C   Mead Slane, Nelda Bucks, NP  Patient Care Team: Zeniya Lapidus, Nelda Bucks, NP as PCP - General (Family Medicine) Deno Etienne, MD as Consulting Physician (Anesthesiology) Peri Maris, MD as Referring Physician (Student) Armbruster, [name], MD as Consulting Physician (Gastroenterology)  Extended Emergency Contact Information Primary Emergency Contact: [facility] Address: [address]          [place], [place] [address] Johnnette Litter of [place] Phone: [phone] Mobile Phone: [phone] Relation: Spouse  Code Status:  Full Code  Goals of care: Advanced Directive information Advanced Directives 03/29/2021  Does Patient Have a Medical Advance Directive? No  Would patient like information on creating a medical advance directive? No - Patient declined     Chief Complaint  Patient presents with   Annual Exam    Physical     HPI:  Pt is a 65 y.o. female seen today for Annual Physical Examination and medical management of chronic diseases.  She complains of worsening dry skin on both heels.tend to wear open shoes and walks bare feet at home. Recent lipid panel showed elevated total cholesterol 233,TRG 203,and LDL 170.Has not been taking her atorvastatin for the past 3-4 months. Has chronic elevated ALT 45; 37; 49 ; 58 states drinks 2 glasses of wine every day in the evening.willing to cut down on drinking wine.States daughter will be moving in with her shortly while renovating her house both will support each other on cutting down wine use. She denies any abdominal pain or distension.  Recently had her PNA and COVID-19 vaccine at the pharmacy.States had swelling and pain on PNA site still tender but has improved.has used ice compressor to relief pain.   Has appointment scheduled for mammogram and Dexa scan 04/15/2021. Has hx of TAH no longer requires Pap smear.    Past Medical History:  Diagnosis Date   ADHD    Allergy    Crohn disease  ([place])    Depression .   Hiatal hernia    Ileitis    Past Surgical History:  Procedure Laterality Date   ABDOMINAL HYSTERECTOMY     BREAST EXCISIONAL BIOPSY Right    BREAST SURGERY     Tumor Removal   CESAREAN SECTION     COLONOSCOPY  last 03/30/2018   at [facility]=polyps   KNEE SURGERY     TONSILLECTOMY      Allergies  Allergen Reactions   Etodolac Other (See Comments)    Sleepiness, feeling "out of it" Sleepiness, feeling "out of it"    Naproxen Other (See Comments)    "flu like symptoms"   Zanaflex [Tizanidine Hcl] Other (See Comments)    Hallucinations.    Allergies as of 03/30/2021       Reactions   Etodolac Other (See Comments)   Sleepiness, feeling "out of it" Sleepiness, feeling "out of it"   Naproxen Other (See Comments)   "flu like symptoms"   Zanaflex [tizanidine Hcl] Other (See Comments)   Hallucinations.        Medication List        Accurate as of March 30, 2021  8:44 AM. If you have any questions, ask your nurse or doctor.          atorvastatin 10 MG tablet Commonly known as: LIPITOR Take 1 tablet (10 mg total) by mouth daily. 6pm   azelastine 0.1 % nasal spray Commonly known as: ASTELIN Place 2 sprays into both nostrils 2 (two) times daily. Use in each nostril as  directed   BUPROPION HCL PO Take 150 mg by mouth daily.   butalbital-acetaminophen-caffeine 50-325-40 MG tablet Commonly known as: FIORICET Take 1 tablet by mouth every 6 (six) hours as needed. **NEEDS APT FOR FURTHER REFILLS**   DULoxetine 60 MG capsule Commonly known as: CYMBALTA Take 1 capsule (60 mg total) by mouth daily. Please schedule an office visit for further refills. Thank you   RITALIN PO Take 28.7 mg by mouth.        Review of Systems  Constitutional:  Negative for appetite change, chills, fatigue, fever and unexpected weight change.  HENT:  Negative for congestion, dental problem, ear discharge, ear pain, facial swelling, hearing loss, nosebleeds, postnasal  drip, rhinorrhea, sinus pressure, sinus pain, sneezing, sore throat, tinnitus and trouble swallowing.   Eyes:  Positive for visual disturbance. Negative for pain, discharge, redness and itching.       Wears eye glasses for long distance .follows up with Opthalmology.   Respiratory:  Negative for cough, chest tightness, shortness of breath and wheezing.   Cardiovascular:  Negative for chest pain, palpitations and leg swelling.  Gastrointestinal:  Negative for abdominal distention, abdominal pain, blood in stool, constipation, diarrhea, nausea and vomiting.  Endocrine: Negative for cold intolerance, heat intolerance, polydipsia, polyphagia and polyuria.  Genitourinary:  Negative for difficulty urinating, dysuria, flank pain, frequency and urgency.  Musculoskeletal:  Negative for arthralgias, back pain, gait problem, joint swelling, myalgias, neck pain and neck stiffness.  Skin:  Negative for color change, pallor and rash.       Bilateral heel callus   Neurological:  Negative for dizziness, syncope, speech difficulty, weakness, light-headedness and numbness.       Chronic Migraine headache   Hematological:  Does not bruise/bleed easily.  Psychiatric/Behavioral:  Negative for agitation, behavioral problems, confusion, hallucinations, self-injury, sleep disturbance and suicidal ideas. The patient is not nervous/anxious.    Immunization History  Administered Date(s) Administered   Influenza,inj,Quad PF,6+ Mos 05/29/2018   Influenza-Unspecified 06/23/2011, 06/20/2012, 06/08/2015, 05/22/2016, 07/12/2019, 07/23/2020   PFIZER(Purple Top)SARS-COV-2 Vaccination 11/07/2019, 11/28/2019, 08/02/2020, 03/10/2021   Pneumococcal Polysaccharide-23 11/16/2016, 03/10/2021   Tdap 02/19/2006, 11/16/2016   Zoster Recombinat (Shingrix) 08/30/2017, 07/09/2019   Pertinent  Health Maintenance Due  Topic Date Due   DEXA SCAN  Never done   MAMMOGRAM  10/28/2020   INFLUENZA VACCINE  03/22/2021   PNA vac Low Risk Adult  (2 of 2 - PCV13) 03/10/2022   COLONOSCOPY (Pts 45-69yr Insurance coverage will need to be confirmed)  08/18/2029   PAP SMEAR-Modifier  Discontinued   Fall Risk  03/29/2021 03/19/2021 04/20/2020 09/06/2018  Falls in the past year? 0 0 0 0  Number falls in past yr: 0 0 - -  Injury with Fall? 0 0 - -  Risk for fall due to : No Fall Risks No Fall Risks - -  Follow up Falls evaluation completed Falls evaluation completed Falls evaluation completed -   Functional Status Survey:    Vitals:   03/30/21 0830  BP: 124/88  Pulse: 84  Resp: 16  Temp: (!) 97.1 F (36.2 C)  SpO2: 98%  Weight: 154 lb 6.4 oz (70 kg)  Height: _0  (1.626 m)   Body mass index is 26.5 kg/m. Physical Exam Vitals reviewed.  Constitutional:      General: She is not in acute distress.    Appearance: Normal appearance. She is overweight. She is not ill-appearing or diaphoretic.  HENT:     Head: Normocephalic.     Right Ear:  Tympanic membrane, ear canal and external ear normal. There is no impacted cerumen.     Left Ear: Tympanic membrane, ear canal and external ear normal. There is no impacted cerumen.     Nose: Nose normal. No congestion or rhinorrhea.     Mouth/Throat:     Mouth: Mucous membranes are moist.     Pharynx: Oropharynx is clear. No oropharyngeal exudate or posterior oropharyngeal erythema.  Eyes:     General: No scleral icterus.       Right eye: No discharge.        Left eye: No discharge.     Extraocular Movements: Extraocular movements intact.     Conjunctiva/sclera: Conjunctivae normal.     Pupils: Pupils are equal, round, and reactive to light.  Neck:     Vascular: No carotid bruit.  Cardiovascular:     Rate and Rhythm: Normal rate and regular rhythm.     Pulses: Normal pulses.     Heart sounds: Normal heart sounds. No murmur heard.   No friction rub. No gallop.  Pulmonary:     Effort: Pulmonary effort is normal. No respiratory distress.     Breath sounds: Normal breath sounds. No  wheezing, rhonchi or rales.  Chest:     Chest wall: No tenderness.  Abdominal:     General: Bowel sounds are normal. There is no distension.     Palpations: Abdomen is soft. There is no mass.     Tenderness: There is no abdominal tenderness. There is no right CVA tenderness, left CVA tenderness, guarding or rebound.  Musculoskeletal:        General: No swelling or tenderness. Normal range of motion.     Cervical back: Normal range of motion. No rigidity or tenderness.     Right lower leg: No edema.     Left lower leg: No edema.  Feet:     Right foot:     Skin integrity: Callus and fissure present. No erythema or warmth.     Left foot:     Skin integrity: Callus and fissure present. No erythema or warmth.  Lymphadenopathy:     Cervical: No cervical adenopathy.  Skin:    General: Skin is warm and dry.     Coloration: Skin is not pale.     Findings: No bruising, erythema, lesion or rash.  Neurological:     Mental Status: She is alert and oriented to person, place, and time.     Cranial Nerves: No cranial nerve deficit.     Sensory: No sensory deficit.     Motor: No weakness.     Coordination: Coordination normal.     Gait: Gait normal.  Psychiatric:        Mood and Affect: Mood normal.        Speech: Speech normal.        Behavior: Behavior normal.        Thought Content: Thought content normal.        Judgment: Judgment normal.    Labs reviewed: Recent Labs    04/20/20 1413 03/24/21 0821  NA 138 140  K 4.6 4.5  CL 99 102  CO2 23 26  GLUCOSE 114* 96  BUN 10 11  CREATININE 0.72 0.77  CALCIUM 10.0 9.6   Recent Labs    04/20/20 1413 05/25/20 1242 03/24/21 0821  AST 39 30 32  ALT 49* 37* 45*  ALKPHOS 155* 108  --   BILITOT 0.3 0.6 0.6  PROT 7.9  7.8 7.8  ALBUMIN 5.0* 4.7  --    Recent Labs    04/20/20 1413 03/24/21 0821  WBC 8.8 8.0  NEUTROABS  --  4,944  HGB 13.4 13.6  HCT 39.0 40.0  MCV 92 94.1  PLT 322 349   Lab Results  Component Value Date    TSH 1.69 03/24/2021   Lab Results  Component Value Date   HGBA1C 5.5 04/24/2019   Lab Results  Component Value Date   CHOL 288 (H) 03/24/2021   HDL 82 03/24/2021   LDLCALC 170 (H) 03/24/2021   TRIG 203 (H) 03/24/2021   CHOLHDL 3.5 03/24/2021    Significant Diagnostic Results in last 30 days:  No results found.  Assessment/Plan  1. Migraine without status migrainosus, not intractable, unspecified migraine type Chronic  Requires floricet every now and then.  - Oceanographer signed and UDT ordered  - Urine Drug Screen w/Alc, no confirm([facility]) - butalbital-acetaminophen-caffeine (FIORICET) 50-325-40 MG tablet; Take 1 tablet by mouth every 6 (six) hours as needed. **NEEDS APT FOR FURTHER REFILLS**  Dispense: 60 tablet; Refill: 0  2. Callus of heel Bilateral heel thick dry skin with fissure  - Advised to avoid wearing open shoes - soak feet with warm water and soap and gentle scrap with pumice stone  - Ambulatory referral to Podiatry  3. Encounter for general adult medical examination with abnormal findings Up to date with immunization. Medication and labs reviewed patient counselled regarding yearly exam, prevention of dental, diet, regular sustained exercise for at least 30 minutes x 3 /week, Upto date with COVID-19 vaccine continue  hand hygiene, mask and social distancing per CDC guidelines. Proper use of sun screen and protective clothing, recommended schedule for routine labs.  - CBC with Differential/Platelet; Future - CMP with eGFR([facility]); Future - TSH; Future  4. Mixed hyperlipidemia Chol 288,TRG 203,and LDL 170  - Dietary modification and exercise advised  - Has not taken atorvastatin for 3-4 months advised to resume  Atorvastatin 10 mg tablet daily  - Lipid panel; Future  5. Attention deficit hyperactivity disorder (ADHD), unspecified ADHD type Continue on Ritalin  Contract signed and UDT   Family/ staff Communication: Reviewed plan of care with patient  verbalized understanding   Labs/tests ordered: - CBC with Differential/Platelet - CMP with eGFR([facility]) - TSH - Lipid panel - Urine Drug Screen w/Alc, no confirm([facility])  Next Appointment : 6 months for medical management of chronic issues.Fasting Labs prior to visit.    Sandrea Hughs, NP

## 2021-04-01 LAB — DRUG MONITOR, PANEL 1, W/CONF, URINE
Amphetamine: NEGATIVE ng/mL (ref <250)
Amphetamines: NEGATIVE ng/mL (ref <500)
Barbiturates: NEGATIVE ng/mL (ref <300)
Benzodiazepines: NEGATIVE ng/mL (ref <100)
Cocaine Metabolite: NEGATIVE ng/mL (ref <150)
Creatinine: 111.5 mg/dL (ref >=20.0)
Marijuana Metabolite: NEGATIVE ng/mL (ref <20)
Methadone Metabolite: NEGATIVE ng/mL (ref <100)
Methamphetamine: NEGATIVE ng/mL (ref <250)
Opiates: NEGATIVE ng/mL (ref <100)
Oxidant: NEGATIVE ug/mL (ref <200)
Oxycodone: NEGATIVE ng/mL (ref <100)
Phencyclidine: NEGATIVE ng/mL (ref <25)
pH: 5.6 (ref 4.5–9.0)

## 2021-04-01 LAB — DM TEMPLATE

## 2021-04-15 ENCOUNTER — Other Ambulatory Visit: Payer: 59

## 2021-04-15 ENCOUNTER — Inpatient Hospital Stay: Admission: RE | Admit: 2021-04-15 | Payer: 59 | Source: Ambulatory Visit

## 2021-04-22 ENCOUNTER — Ambulatory Visit: Payer: BC Managed Care – PPO

## 2021-04-22 ENCOUNTER — Other Ambulatory Visit: Payer: BC Managed Care – PPO

## 2021-05-07 ENCOUNTER — Ambulatory Visit: Payer: BC Managed Care – PPO | Admitting: Podiatry

## 2021-05-07 ENCOUNTER — Other Ambulatory Visit: Payer: Self-pay

## 2021-05-07 ENCOUNTER — Encounter: Payer: Self-pay | Admitting: Podiatry

## 2021-05-07 DIAGNOSIS — M2010 Hallux valgus (acquired), unspecified foot: Secondary | ICD-10-CM

## 2021-05-07 DIAGNOSIS — R234 Changes in skin texture: Secondary | ICD-10-CM

## 2021-05-07 DIAGNOSIS — L84 Corns and callosities: Secondary | ICD-10-CM

## 2021-05-07 DIAGNOSIS — M2040 Other hammer toe(s) (acquired), unspecified foot: Secondary | ICD-10-CM | POA: Insufficient documentation

## 2021-05-07 MED ORDER — AMMONIUM LACTATE 12 % EX LOTN: 1.0000 "application " | TOPICAL_LOTION | CUTANEOUS | 5 refills | Status: DC | PRN

## 2021-05-07 NOTE — Progress Notes (Signed)
This patient presents the office with chief complaint of callus and cracking on both heels.  She says this condition has been present for years.  She says that she has used different tools to trim  her callus as well as lotions to her feet.  She says that this condition comes and goes.  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  She presents the office today to discuss this condition.  Vascular  Dorsalis pedis and posterior tibial pulses are palpable  B/L.  Capillary return  WNL.  Temperature gradient is  WNL.  Skin turgor  WNL  Sensorium  Senn Weinstein monofilament wire  WNL. Normal tactile sensation.  Nail Exam  Patient has normal nails with no evidence of bacterial or fungal infection.  Orthopedic  Exam  Muscle tone and muscle strength  WNL.  No limitations of motion feet  B/L.  No crepitus or joint effusion noted.  Foot type is unremarkable and digits show no abnormalities.  HAV  B/L.  Hammer toes  2-4  B/L.  Skin  No open lesions.  Normal skin texture and turgor. Callus dorsoplantar aspect of both heels.  Fissures noted at the callus area on both heels.    Callus with fissures both heels.    IE.  Discussed this condition with this patient.  Used dremel to thin her callus.  Prescribed amlactin for her heels.  RTC prn.  Gardiner Barefoot DPM

## 2021-05-11 ENCOUNTER — Other Ambulatory Visit: Payer: Self-pay | Admitting: *Deleted

## 2021-05-11 DIAGNOSIS — E782 Mixed hyperlipidemia: Secondary | ICD-10-CM

## 2021-05-11 MED ORDER — ATORVASTATIN CALCIUM 10 MG PO TABS: 10.0000 mg | ORAL_TABLET | Freq: Every day | ORAL | 1 refills | Status: DC

## 2021-05-11 NOTE — Telephone Encounter (Signed)
[facility] requested refill.

## 2021-06-22 ENCOUNTER — Other Ambulatory Visit: Payer: Self-pay | Admitting: Family

## 2021-06-22 NOTE — Telephone Encounter (Signed)
Medication is not in patient's current medication list. Is this ok to add to medication list and refill.  Please Advise.

## 2021-06-22 NOTE — Telephone Encounter (Signed)
Can you please verify diagnosis for valacyclovir then send valacyclovir 1000 mg tablet one by mouth twice daily to pharmacy

## 2021-06-23 MED ORDER — VALACYCLOVIR HCL 1 G PO TABS: 1000.0000 mg | ORAL_TABLET | Freq: Two times a day (BID) | ORAL | 3 refills | Status: DC | PRN

## 2021-06-23 NOTE — Telephone Encounter (Signed)
Patient stated that she takes it for [employer].  Patient is requesting Rx to be sent to [facility].  Pended and sent to [facility] for approval.

## 2021-06-24 DIAGNOSIS — F331 Major depressive disorder, recurrent, moderate: Secondary | ICD-10-CM | POA: Diagnosis not present

## 2021-06-24 DIAGNOSIS — F900 Attention-deficit hyperactivity disorder, predominantly inattentive type: Secondary | ICD-10-CM | POA: Diagnosis not present

## 2021-06-24 DIAGNOSIS — Z79899 Other long term (current) drug therapy: Secondary | ICD-10-CM | POA: Diagnosis not present

## 2021-07-18 ENCOUNTER — Other Ambulatory Visit: Payer: Self-pay | Admitting: Physician Assistant

## 2021-07-18 DIAGNOSIS — G43909 Migraine, unspecified, not intractable, without status migrainosus: Secondary | ICD-10-CM

## 2021-07-25 ENCOUNTER — Other Ambulatory Visit: Payer: Self-pay | Admitting: Family

## 2021-07-25 DIAGNOSIS — G43909 Migraine, unspecified, not intractable, without status migrainosus: Secondary | ICD-10-CM

## 2021-07-26 NOTE — Telephone Encounter (Signed)
RX last filled 03/30/21. Treatment agreement on file from 03/30/21

## 2021-08-03 ENCOUNTER — Other Ambulatory Visit: Payer: BC Managed Care – PPO

## 2021-08-25 ENCOUNTER — Other Ambulatory Visit: Payer: BC Managed Care – PPO

## 2021-09-23 DIAGNOSIS — Z79899 Other long term (current) drug therapy: Secondary | ICD-10-CM | POA: Diagnosis not present

## 2021-09-23 DIAGNOSIS — F902 Attention-deficit hyperactivity disorder, combined type: Secondary | ICD-10-CM | POA: Diagnosis not present

## 2021-09-24 ENCOUNTER — Other Ambulatory Visit: Payer: Self-pay

## 2021-09-24 DIAGNOSIS — F902 Attention-deficit hyperactivity disorder, combined type: Secondary | ICD-10-CM | POA: Diagnosis not present

## 2021-09-24 DIAGNOSIS — G43909 Migraine, unspecified, not intractable, without status migrainosus: Secondary | ICD-10-CM

## 2021-09-24 MED ORDER — BUTALBITAL-APAP-CAFFEINE 50-325-40 MG PO TABS: 1.0000 | ORAL_TABLET | Freq: Four times a day (QID) | ORAL | 0 refills | Status: DC | PRN

## 2021-09-24 NOTE — Telephone Encounter (Signed)
Patient send a rx request via MyChart for Fioricet tablets.

## 2021-10-12 ENCOUNTER — Ambulatory Visit: Payer: BC Managed Care – PPO | Admitting: Family

## 2021-11-04 DIAGNOSIS — M5451 Vertebrogenic low back pain: Secondary | ICD-10-CM | POA: Diagnosis not present

## 2021-11-22 DIAGNOSIS — M9903 Segmental and somatic dysfunction of lumbar region: Secondary | ICD-10-CM | POA: Diagnosis not present

## 2021-11-22 DIAGNOSIS — M9901 Segmental and somatic dysfunction of cervical region: Secondary | ICD-10-CM | POA: Diagnosis not present

## 2021-11-22 DIAGNOSIS — M5116 Intervertebral disc disorders with radiculopathy, lumbar region: Secondary | ICD-10-CM | POA: Diagnosis not present

## 2021-11-22 DIAGNOSIS — M530 Cervicocranial syndrome: Secondary | ICD-10-CM | POA: Diagnosis not present

## 2021-11-23 DIAGNOSIS — M9901 Segmental and somatic dysfunction of cervical region: Secondary | ICD-10-CM | POA: Diagnosis not present

## 2021-11-23 DIAGNOSIS — M9903 Segmental and somatic dysfunction of lumbar region: Secondary | ICD-10-CM | POA: Diagnosis not present

## 2021-11-23 DIAGNOSIS — M530 Cervicocranial syndrome: Secondary | ICD-10-CM | POA: Diagnosis not present

## 2021-11-23 DIAGNOSIS — M5116 Intervertebral disc disorders with radiculopathy, lumbar region: Secondary | ICD-10-CM | POA: Diagnosis not present

## 2021-11-25 DIAGNOSIS — M530 Cervicocranial syndrome: Secondary | ICD-10-CM | POA: Diagnosis not present

## 2021-11-25 DIAGNOSIS — M9903 Segmental and somatic dysfunction of lumbar region: Secondary | ICD-10-CM | POA: Diagnosis not present

## 2021-11-25 DIAGNOSIS — M5116 Intervertebral disc disorders with radiculopathy, lumbar region: Secondary | ICD-10-CM | POA: Diagnosis not present

## 2021-11-25 DIAGNOSIS — M9901 Segmental and somatic dysfunction of cervical region: Secondary | ICD-10-CM | POA: Diagnosis not present

## 2021-11-29 ENCOUNTER — Encounter: Payer: Self-pay | Admitting: Family

## 2021-11-29 ENCOUNTER — Telehealth (INDEPENDENT_AMBULATORY_CARE_PROVIDER_SITE_OTHER): Payer: BC Managed Care – PPO | Admitting: Family

## 2021-11-29 DIAGNOSIS — Z20822 Contact with and (suspected) exposure to covid-19: Secondary | ICD-10-CM | POA: Diagnosis not present

## 2021-11-29 DIAGNOSIS — U071 COVID-19: Secondary | ICD-10-CM

## 2021-11-29 DIAGNOSIS — J069 Acute upper respiratory infection, unspecified: Secondary | ICD-10-CM

## 2021-11-29 DIAGNOSIS — R058 Other specified cough: Secondary | ICD-10-CM

## 2021-11-29 MED ORDER — VITAMIN D3 50 MCG (2000 UT) PO CAPS: 2000.0000 [IU] | ORAL_CAPSULE | Freq: Every day | ORAL | 0 refills | Status: AC

## 2021-11-29 MED ORDER — HYDROCOD POLI-CHLORPHE POLI ER 10-8 MG/5ML PO SUER: 5.0000 mL | Freq: Two times a day (BID) | ORAL | 0 refills | Status: DC | PRN

## 2021-11-29 MED ORDER — ZINC GLUCONATE 50 MG PO TABS: 50.0000 mg | ORAL_TABLET | Freq: Every day | ORAL | 0 refills | Status: AC

## 2021-11-29 NOTE — Progress Notes (Signed)
?This service is provided via telemedicine ? ?No vital signs collected/recorded due to the encounter was a telemedicine visit.  ? ?Location of patient (ex: home, work):  Home. ? ?Patient consents to a telephone visit:  Yes ? ?Location of the provider (ex: office, home):  [employer]. ? ?Name of any referring provider:  Trea Carnegie, Nelda Bucks, NP  ? ?Names of all persons participating in the telemedicine service and their role in the encounter:  Patient, Heriberto Antigua, [place], [place], [place], NP.   ? ?Time spent on call: 8 minutes spent on the phone with Medical Assistant.  ? ? ? ?Provider: Marlowe Sax FNP-C ? ?Sarath Privott, Nelda Bucks, NP ? ?Patient Care Team: ?Angalena Cousineau, Nelda Bucks, NP as PCP - General (Family Medicine) ?Deno Etienne, MD as Consulting Physician (Anesthesiology) ?Peri Maris, MD as Referring Physician (Student) ?Armbruster, [name], MD as Consulting Physician (Gastroenterology) ? ?Extended Emergency Contact Information ?Primary Emergency Contact: Birdsall,Lawrence ?Address: [address] ?         [place], [place] [address] [place] ?Home Phone: [phone] ?Mobile Phone: [phone] ?Relation: Spouse ? ?Code Status:  Full Code  ?Goals of care: Advanced Directive information ? ?  11/29/2021  ?  2:25 PM  ?Advanced Directives  ?Does Patient Have a Medical Advance Directive? No  ?Would patient like information on creating a medical advance directive? No - Patient declined  ? ? ? ?Chief Complaint  ?Patient presents with  ? Acute Visit  ?  Patient complains of testing positive for Covid-19 on Saturday 11/27/2021. Patient symptoms were fever, coughing, fatigued, headaches, sore throat, diarrhea, body aches, and chills.  ? ? ?HPI:  ?Pt is a 66 y.o. female seen today for an acute visit for evaluation of COVID-19 positive.States was not feeling well on Friday 11/26/2021 checked for COVID-19 with a Home test Kit was negative but repeated the test on Saturday since she was feeling bad and  test was positive.She retested and Sunday and test was still positive. ?Had fever and chills on Saturday and Sunday. She feels tired with stuffed up nose.No loss of smell or taste. ?Does not recall being around anyone that was sick with COVID-19 but she does work with many People. ? ? ?Past Medical History:  ?Diagnosis Date  ? ADHD   ? Allergy   ? Crohn disease ([place])   ? Depression .  ? Hiatal hernia   ? Ileitis   ? ?Past Surgical History:  ?Procedure Laterality Date  ? ABDOMINAL HYSTERECTOMY    ? BREAST EXCISIONAL BIOPSY Right   ? BREAST SURGERY    ? Tumor Removal  ? CESAREAN SECTION    ? COLONOSCOPY  last 03/30/2018  ? at [facility]=polyps  ? KNEE SURGERY    ? TONSILLECTOMY    ? ? ?Allergies  ?Allergen Reactions  ? Etodolac Other (See Comments)  ?  Sleepiness, feeling "out of it" ?Sleepiness, feeling "out of it" ?  ? Tizanidine Hcl Other (See Comments)  ?  Hallucinations.  ? Naproxen Other (See Comments)  ?  "flu like symptoms"  ? Prednisone Other (See Comments)  ? ? ?Outpatient Encounter Medications as of 11/29/2021  ?Medication Sig  ? ammonium lactate (LAC-HYDRIN) 12 % lotion Apply 1 application topically as needed for dry skin.  ? atorvastatin (LIPITOR) 10 MG tablet Take 1 tablet (10 mg total) by mouth daily. 6pm  ? azelastine (ASTELIN) 0.1 % nasal spray Place 2 sprays into both nostrils 2 (two) times daily. Use in each nostril as directed  ?  BUPROPION HCL PO Take 150 mg by mouth daily.  ? butalbital-acetaminophen-caffeine (FIORICET) 50-325-40 MG tablet Take 1 tablet by mouth every 6 (six) hours as needed for headache.  ? Methylphenidate (COTEMPLA XR-ODT) 25.9 MG TBED Take 1 tablet by mouth in the morning.  ? valACYclovir (VALTREX) 1000 MG tablet Take 1 tablet (1,000 mg total) by mouth 2 (two) times daily as needed.  ? [DISCONTINUED] DULoxetine (CYMBALTA) 60 MG capsule Take 1 capsule (60 mg total) by mouth daily. Please schedule an office visit for further refills. Thank you  ? [DISCONTINUED] Hyoscyamine Sulfate SL  (LEVSIN/SL) 0.125 MG SUBL Place 1 tablet under the tongue every 6 (six) hours as needed. (Patient not taking: Reported on 08/13/2019)  ? [DISCONTINUED] methylphenidate 27 MG PO CR tablet Take 27 mg by mouth daily.  ? ?No facility-administered encounter medications on file as of 11/29/2021.  ? ? ?Review of Systems  ?Constitutional:  Positive for chills, fatigue and fever. Negative for appetite change and unexpected weight change.  ?HENT:  Positive for congestion. Negative for dental problem, ear discharge, ear pain, facial swelling, hearing loss, nosebleeds, postnasal drip, rhinorrhea, sinus pressure, sinus pain, sneezing, sore throat, tinnitus and trouble swallowing.   ?Eyes:  Negative for pain, discharge, redness, itching and visual disturbance.  ?Respiratory:  Positive for cough. Negative for chest tightness, shortness of breath and wheezing.   ?Cardiovascular:  Negative for chest pain, palpitations and leg swelling.  ?Gastrointestinal:  Negative for abdominal distention, abdominal pain, blood in stool, constipation, diarrhea, nausea and vomiting.  ?Genitourinary:  Negative for difficulty urinating, dysuria, flank pain, frequency and urgency.  ?Musculoskeletal:  Negative for arthralgias, back pain, gait problem, joint swelling, myalgias, neck pain and neck stiffness.  ?Skin:  Negative for color change, pallor and rash.  ?Neurological:  Negative for dizziness, weakness, light-headedness, numbness and headaches.  ?Hematological:  Does not bruise/bleed easily.  ? ?Immunization History  ?Administered Date(s) Administered  ? Influenza,inj,Quad PF,6+ Mos 05/29/2018  ? Influenza-Unspecified 06/23/2011, 06/20/2012, 06/08/2015, 05/22/2016, 07/12/2019, 07/23/2020  ? PFIZER(Purple Top)SARS-COV-2 Vaccination 11/07/2019, 11/28/2019, 08/02/2020, 03/10/2021  ? Pneumococcal Polysaccharide-23 11/16/2016, 03/10/2021  ? Tdap 02/19/2006, 11/16/2016  ? Zoster Recombinat (Shingrix) 08/30/2017, 07/09/2019  ? ?Pertinent  Health  Maintenance Due  ?Topic Date Due  ? DEXA SCAN  Never done  ? MAMMOGRAM  10/28/2020  ? INFLUENZA VACCINE  03/22/2022  ? COLONOSCOPY (Pts 45-21yr Insurance coverage will need to be confirmed)  08/18/2029  ? ? ?  07/07/2020  ?  1:24 PM 01/10/2021  ? 12:30 PM 03/19/2021  ? 11:07 AM 03/29/2021  ?  4:39 PM 11/29/2021  ?  2:25 PM  ?Fall Risk  ?Falls in the past year?   0 0 0  ?Was there an injury with Fall?   0 0 0  ?Fall Risk Category Calculator   0 0 0  ?Fall Risk Category   Low Low Low  ?Patient Fall Risk Level Low fall risk Low fall risk Low fall risk Low fall risk Low fall risk  ?Patient at Risk for Falls Due to   No Fall Risks No Fall Risks No Fall Risks  ?Fall risk Follow up   Falls evaluation completed Falls evaluation completed Falls evaluation completed  ? ?Functional Status Survey: ?  ? ?There were no vitals filed for this visit. ?There is no height or weight on file to calculate BMI. ?Physical Exam ?Constitutional:   ?   General: She is not in acute distress. ?   Appearance: She is not ill-appearing.  ?Pulmonary:  ?  Effort: Pulmonary effort is normal. No respiratory distress.  ?Neurological:  ?   Mental Status: She is alert and oriented to person, place, and time.  ?Psychiatric:     ?   Mood and Affect: Mood normal.     ?   Behavior: Behavior normal.  ? ? ?Labs reviewed: ?Recent Labs  ?  03/24/21 ?0821  ?NA 140  ?K 4.5  ?CL 102  ?CO2 26  ?GLUCOSE 96  ?BUN 11  ?CREATININE 0.77  ?CALCIUM 9.6  ? ?Recent Labs  ?  03/24/21 ?0821  ?AST 32  ?ALT 45*  ?BILITOT 0.6  ?PROT 7.8  ? ?Recent Labs  ?  03/24/21 ?0821  ?WBC 8.0  ?NEUTROABS 4,944  ?HGB 13.6  ?HCT 40.0  ?MCV 94.1  ?PLT 349  ? ?Lab Results  ?Component Value Date  ? TSH 1.69 03/24/2021  ? ?Lab Results  ?Component Value Date  ? HGBA1C 5.5 04/24/2019  ? ?Lab Results  ?Component Value Date  ? CHOL 288 (H) 03/24/2021  ? HDL 82 03/24/2021  ? LDLCALC 170 (H) 03/24/2021  ? TRIG 203 (H) 03/24/2021  ? CHOLHDL 3.5 03/24/2021  ? ? ?Significant Diagnostic Results in last 30  days:  ?No results found. ? ?Assessment/Plan ?1. COVID-19 ?Had chills and fever on Saturday and Sunday but none today ?-COVID test positive on Saturday and Sunday ?-Antiviral use such as Paxlovid but patient declines

## 2021-11-30 DIAGNOSIS — Z20822 Contact with and (suspected) exposure to covid-19: Secondary | ICD-10-CM

## 2021-11-30 DIAGNOSIS — U071 COVID-19: Secondary | ICD-10-CM

## 2021-11-30 MED ORDER — HYDROCOD POLI-CHLORPHE POLI ER 10-8 MG/5ML PO SUER: 5.0000 mL | Freq: Two times a day (BID) | ORAL | 0 refills | Status: DC | PRN

## 2021-11-30 NOTE — Telephone Encounter (Signed)
Pended Rx and sent to [facility] for approval.  ?

## 2021-11-30 NOTE — Telephone Encounter (Signed)
Patient notified

## 2021-11-30 NOTE — Telephone Encounter (Signed)
Duplicate Message

## 2021-12-15 DIAGNOSIS — M5451 Vertebrogenic low back pain: Secondary | ICD-10-CM | POA: Diagnosis not present

## 2021-12-16 DIAGNOSIS — M545 Low back pain, unspecified: Secondary | ICD-10-CM | POA: Diagnosis not present

## 2021-12-16 DIAGNOSIS — M5451 Vertebrogenic low back pain: Secondary | ICD-10-CM | POA: Diagnosis not present

## 2021-12-20 DIAGNOSIS — B001 Herpesviral vesicular dermatitis: Secondary | ICD-10-CM

## 2021-12-20 MED ORDER — ACYCLOVIR 5 % EX OINT: 1.0000 "application " | TOPICAL_OINTMENT | CUTANEOUS | 0 refills | Status: DC

## 2021-12-20 NOTE — Telephone Encounter (Signed)
[name], ?Glad to hear you are feeling better.I have send Acyclovir 5% ointment to [facility] on [address].  ?

## 2021-12-20 NOTE — Telephone Encounter (Signed)
Message sent to Ngetich, Nelda Bucks, NP ?

## 2021-12-23 DIAGNOSIS — F900 Attention-deficit hyperactivity disorder, predominantly inattentive type: Secondary | ICD-10-CM | POA: Diagnosis not present

## 2021-12-23 DIAGNOSIS — Z79899 Other long term (current) drug therapy: Secondary | ICD-10-CM | POA: Diagnosis not present

## 2021-12-28 DIAGNOSIS — M5451 Vertebrogenic low back pain: Secondary | ICD-10-CM | POA: Diagnosis not present

## 2021-12-30 DIAGNOSIS — B001 Herpesviral vesicular dermatitis: Secondary | ICD-10-CM

## 2021-12-30 MED ORDER — ACYCLOVIR 5 % EX OINT: 1.0000 "application " | TOPICAL_OINTMENT | CUTANEOUS | 0 refills | Status: DC

## 2021-12-30 NOTE — Telephone Encounter (Signed)
Pended Rx and Pharmacy Changed.  ?Sent to [facility] for approval due to [place]. ([place] out of office) ?

## 2022-01-03 DIAGNOSIS — H4312 Vitreous hemorrhage, left eye: Secondary | ICD-10-CM | POA: Diagnosis not present

## 2022-01-05 DIAGNOSIS — H35363 Drusen (degenerative) of macula, bilateral: Secondary | ICD-10-CM | POA: Diagnosis not present

## 2022-01-05 DIAGNOSIS — H43392 Other vitreous opacities, left eye: Secondary | ICD-10-CM | POA: Diagnosis not present

## 2022-01-05 DIAGNOSIS — H43813 Vitreous degeneration, bilateral: Secondary | ICD-10-CM | POA: Diagnosis not present

## 2022-01-05 DIAGNOSIS — H3589 Other specified retinal disorders: Secondary | ICD-10-CM | POA: Diagnosis not present

## 2022-01-05 DIAGNOSIS — H4312 Vitreous hemorrhage, left eye: Secondary | ICD-10-CM | POA: Diagnosis not present

## 2022-01-05 DIAGNOSIS — H35423 Microcystoid degeneration of retina, bilateral: Secondary | ICD-10-CM | POA: Diagnosis not present

## 2022-01-06 DIAGNOSIS — M5451 Vertebrogenic low back pain: Secondary | ICD-10-CM | POA: Diagnosis not present

## 2022-01-10 DIAGNOSIS — M5451 Vertebrogenic low back pain: Secondary | ICD-10-CM | POA: Diagnosis not present

## 2022-01-27 DIAGNOSIS — M47816 Spondylosis without myelopathy or radiculopathy, lumbar region: Secondary | ICD-10-CM | POA: Diagnosis not present

## 2022-02-03 NOTE — Telephone Encounter (Signed)
Received fax from [facility] stating they DENIED the Zovirax Ointment. Forwarded Denial to [employer] [facility].

## 2022-02-10 DIAGNOSIS — M5451 Vertebrogenic low back pain: Secondary | ICD-10-CM | POA: Diagnosis not present

## 2022-02-17 DIAGNOSIS — M5451 Vertebrogenic low back pain: Secondary | ICD-10-CM | POA: Diagnosis not present

## 2022-02-20 DIAGNOSIS — R102 Pelvic and perineal pain: Secondary | ICD-10-CM | POA: Diagnosis not present

## 2022-03-01 ENCOUNTER — Other Ambulatory Visit: Payer: Self-pay | Admitting: Family

## 2022-03-01 DIAGNOSIS — G43909 Migraine, unspecified, not intractable, without status migrainosus: Secondary | ICD-10-CM

## 2022-03-01 MED ORDER — BUTALBITAL-APAP-CAFFEINE 50-325-40 MG PO TABS: 1.0000 | ORAL_TABLET | Freq: Four times a day (QID) | ORAL | 5 refills | Status: DC | PRN

## 2022-03-01 MED ORDER — BUTALBITAL-APAP-CAFFEINE 50-325-40 MG PO TABS: 1.0000 | ORAL_TABLET | Freq: Four times a day (QID) | ORAL | 0 refills | Status: DC | PRN

## 2022-03-01 NOTE — Telephone Encounter (Signed)
Please schedule for Routine visit appointment with same day fasting labs.

## 2022-03-01 NOTE — Telephone Encounter (Signed)
Rx last refill (03/30/21), 30/0 refill. Treatment agreement on file dated 03/30/21. Patient does not have [facility] appointment.    Please advise.

## 2022-03-01 NOTE — Addendum Note (Signed)
Addended by: Bing Matter A on: 03/01/2022 02:49 PM   Modules accepted: Orders

## 2022-03-01 NOTE — Addendum Note (Signed)
Addended byMarlowe Sax C on: 03/01/2022 04:20 PM   Modules accepted: Orders

## 2022-03-18 ENCOUNTER — Ambulatory Visit: Payer: BC Managed Care – PPO | Admitting: Nurse Practitioner

## 2022-03-25 ENCOUNTER — Ambulatory Visit: Payer: BC Managed Care – PPO | Admitting: Nurse Practitioner

## 2022-04-19 DIAGNOSIS — F900 Attention-deficit hyperactivity disorder, predominantly inattentive type: Secondary | ICD-10-CM | POA: Diagnosis not present

## 2022-04-19 DIAGNOSIS — Z79899 Other long term (current) drug therapy: Secondary | ICD-10-CM | POA: Diagnosis not present

## 2022-04-21 DIAGNOSIS — Z79899 Other long term (current) drug therapy: Secondary | ICD-10-CM | POA: Diagnosis not present

## 2022-04-29 DIAGNOSIS — M2560 Stiffness of unspecified joint, not elsewhere classified: Secondary | ICD-10-CM | POA: Diagnosis not present

## 2022-04-29 DIAGNOSIS — Z79899 Other long term (current) drug therapy: Secondary | ICD-10-CM | POA: Diagnosis not present

## 2022-04-29 DIAGNOSIS — M25559 Pain in unspecified hip: Secondary | ICD-10-CM | POA: Diagnosis not present

## 2022-04-29 DIAGNOSIS — M353 Polymyalgia rheumatica: Secondary | ICD-10-CM | POA: Diagnosis not present

## 2022-04-29 LAB — HEPATIC FUNCTION PANEL
ALT: 29 U/L (ref 7–35)
AST: 27 (ref 13–35)
Alkaline Phosphatase: 126 — AB (ref 25–125)
Bilirubin, Total: 0.7

## 2022-04-29 LAB — BASIC METABOLIC PANEL
BUN: 9 (ref 4–21)
CO2: 21 (ref 13–22)
Chloride: 99 (ref 99–108)
Creatinine: 0.8 (ref 0.5–1.1)
Glucose: 104
Potassium: 5 mEq/L (ref 3.5–5.1)
Sodium: 139 (ref 137–147)

## 2022-04-29 LAB — CBC: RBC: 4 (ref 3.87–5.11)

## 2022-04-29 LAB — CBC AND DIFFERENTIAL
HCT: 37 (ref 36–46)
Hemoglobin: 13 (ref 12.0–16.0)
Neutrophils Absolute: 6.1
Platelets: 352 10*3/uL (ref 150–400)
WBC: 8.9

## 2022-04-29 LAB — POCT ERYTHROCYTE SEDIMENTATION RATE, NON-AUTOMATED: Sed Rate: 7

## 2022-04-29 LAB — COMPREHENSIVE METABOLIC PANEL
Albumin: 5 (ref 3.5–5.0)
Calcium: 10.4 (ref 8.7–10.7)
Globulin: 2.7
eGFR: 79

## 2022-05-09 ENCOUNTER — Other Ambulatory Visit: Payer: Self-pay | Admitting: Family

## 2022-05-09 DIAGNOSIS — G43909 Migraine, unspecified, not intractable, without status migrainosus: Secondary | ICD-10-CM

## 2022-05-09 MED ORDER — BUTALBITAL-APAP-CAFFEINE 50-325-40 MG PO TABS: 1.0000 | ORAL_TABLET | Freq: Four times a day (QID) | ORAL | 0 refills | Status: DC | PRN

## 2022-05-09 NOTE — Telephone Encounter (Signed)
Patient requested refill.  Epic LR: 03/01/2022  Pended Rx and sent to [facility] for approval.

## 2022-05-14 ENCOUNTER — Other Ambulatory Visit: Payer: Self-pay | Admitting: Family

## 2022-05-14 DIAGNOSIS — E782 Mixed hyperlipidemia: Secondary | ICD-10-CM

## 2022-05-16 NOTE — Telephone Encounter (Signed)
Patient has request refill on medication Atorvastatin 4m. Patient medication has end date. Medication pend and sent to PCP Ngetich, DNelda Bucks NP for approval.

## 2022-05-26 ENCOUNTER — Encounter: Payer: Self-pay | Admitting: Adult Health

## 2022-05-26 ENCOUNTER — Ambulatory Visit (INDEPENDENT_AMBULATORY_CARE_PROVIDER_SITE_OTHER): Payer: BC Managed Care – PPO | Admitting: Adult Health

## 2022-05-26 VITALS — BP 132/88 | HR 73 | Temp 97.1°F | Ht 64.0 in | Wt 142.2 lb

## 2022-05-26 DIAGNOSIS — G43909 Migraine, unspecified, not intractable, without status migrainosus: Secondary | ICD-10-CM | POA: Diagnosis not present

## 2022-05-26 DIAGNOSIS — K5000 Crohn's disease of small intestine without complications: Secondary | ICD-10-CM

## 2022-05-26 DIAGNOSIS — E782 Mixed hyperlipidemia: Secondary | ICD-10-CM | POA: Diagnosis not present

## 2022-05-26 DIAGNOSIS — D464 Refractory anemia, unspecified: Secondary | ICD-10-CM | POA: Diagnosis not present

## 2022-05-26 DIAGNOSIS — F988 Other specified behavioral and emotional disorders with onset usually occurring in childhood and adolescence: Secondary | ICD-10-CM

## 2022-05-26 DIAGNOSIS — Z0000 Encounter for general adult medical examination without abnormal findings: Secondary | ICD-10-CM

## 2022-05-26 DIAGNOSIS — E785 Hyperlipidemia, unspecified: Secondary | ICD-10-CM

## 2022-05-26 NOTE — Telephone Encounter (Signed)
Labs abstracted to chart.

## 2022-05-26 NOTE — Progress Notes (Signed)
[facility] clinic  Provider:  Durenda Age DNP  Code Status:  Full Code  Goals of Care:     11/29/2021    2:25 PM  Advanced Directives  Does Patient Have a Medical Advance Directive? No  Would patient like information on creating a medical advance directive? No - Patient declined     Chief Complaint  Patient presents with   Annual Exam    Patient presents today for a physical     HPI: Patient is a 66 y.o. female seen today for a physical exam.   Mixed hyperlipidemia -  takes Lipitor  Crohn's disease of small intestine without complication ([place]) - has occasional abdominal cramps  RA (refractory anemia) ([place]) -  follows up with Rheumatology  Migraine without status migrainosus, not intractable, unspecified migraine type   Past Medical History:  Diagnosis Date   ADHD    Allergy    Crohn disease ([place])    Depression .   Hiatal hernia    Ileitis     Past Surgical History:  Procedure Laterality Date   ABDOMINAL HYSTERECTOMY     BREAST EXCISIONAL BIOPSY Right    BREAST SURGERY     Tumor Removal   CESAREAN SECTION     COLONOSCOPY  last 03/30/2018   at [facility]=polyps   KNEE SURGERY     TONSILLECTOMY      Allergies  Allergen Reactions   Etodolac Other (See Comments)    Sleepiness, feeling "out of it" Sleepiness, feeling "out of it"    Tizanidine Hcl Other (See Comments)    Hallucinations.   Naproxen Other (See Comments)    "flu like symptoms"   Prednisone Other (See Comments)    Outpatient Encounter Medications as of 05/26/2022  Medication Sig   ammonium lactate (LAC-HYDRIN) 12 % lotion Apply 1 application topically as needed for dry skin.   atorvastatin (LIPITOR) 10 MG tablet TAKE 1 TABLET DAILY AT 6 P.M.   azelastine (ASTELIN) 0.1 % nasal spray Place 2 sprays into both nostrils 2 (two) times daily. Use in each nostril as directed   BUPROPION HCL PO Take 150 mg by mouth daily.   butalbital-acetaminophen-caffeine (FIORICET) 50-325-40 MG tablet Take 1  tablet by mouth every 6 (six) hours as needed for headache.   Methylphenidate (COTEMPLA XR-ODT) 25.9 MG TBED Take 1 tablet by mouth in the morning.   valACYclovir (VALTREX) 1000 MG tablet Take 1 tablet (1,000 mg total) by mouth 2 (two) times daily as needed.   chlorpheniramine-HYDROcodone (TUSSIONEX PENNKINETIC ER) 10-8 MG/5ML Take 5 mLs by mouth every 12 (twelve) hours as needed for cough. (Patient not taking: Reported on 05/26/2022)   [DISCONTINUED] acyclovir ointment (ZOVIRAX) 5 % Apply 1 application. topically every 3 (three) hours.   [DISCONTINUED] Hyoscyamine Sulfate SL (LEVSIN/SL) 0.125 MG SUBL Place 1 tablet under the tongue every 6 (six) hours as needed. (Patient not taking: Reported on 08/13/2019)   No facility-administered encounter medications on file as of 05/26/2022.    Review of Systems:  Review of Systems  Constitutional:  Negative for appetite change, chills, fatigue and fever.  HENT:  Negative for congestion, hearing loss, rhinorrhea and sore throat.   Eyes: Negative.   Respiratory:  Negative for cough, shortness of breath and wheezing.   Cardiovascular:  Negative for chest pain, palpitations and leg swelling.  Gastrointestinal:  Positive for abdominal pain. Negative for constipation, diarrhea, nausea and vomiting.       Occasional abdominal cramps  Genitourinary:  Negative for dysuria.  Musculoskeletal:  Negative for arthralgias, back pain and myalgias.  Skin:  Negative for color change, rash and wound.  Neurological:  Negative for dizziness, weakness and headaches.  Psychiatric/Behavioral:  Negative for behavioral problems. The patient is not nervous/anxious.     Health Maintenance  Topic Date Due   DEXA SCAN  Never done   MAMMOGRAM  10/28/2020   COVID-19 Vaccine (5 - Pfizer risk series) 05/05/2021   Pneumonia Vaccine 34+ Years old (2 - PCV) 03/10/2022   INFLUENZA VACCINE  03/22/2022   TETANUS/TDAP  11/17/2026   COLONOSCOPY (Pts 45-24yr Insurance coverage will  need to be confirmed)  08/18/2029   Hepatitis C Screening  Completed   Zoster Vaccines- Shingrix  Completed   HPV VACCINES  Aged Out    Physical Exam: Vitals:   05/26/22 0832  BP: 132/88  Pulse: 73  Temp: (!) 97.1 F (36.2 C)  SpO2: 99%  Weight: 142 lb 3.2 oz (64.5 kg)  Height: 5' 4"  (1.626 m)   Body mass index is 24.41 kg/m. Physical Exam Constitutional:      General: She is not in acute distress.    Appearance: Normal appearance.  HENT:     Head: Normocephalic and atraumatic.     Nose: Nose normal.     Mouth/Throat:     Mouth: Mucous membranes are moist.  Eyes:     Conjunctiva/sclera: Conjunctivae normal.  Cardiovascular:     Rate and Rhythm: Normal rate and regular rhythm.  Pulmonary:     Effort: Pulmonary effort is normal.     Breath sounds: Normal breath sounds.  Abdominal:     General: Bowel sounds are normal.     Palpations: Abdomen is soft.  Musculoskeletal:        General: Normal range of motion.     Cervical back: Normal range of motion.  Skin:    General: Skin is warm and dry.  Neurological:     General: No focal deficit present.     Mental Status: She is alert and oriented to person, place, and time.  Psychiatric:        Mood and Affect: Mood normal.        Behavior: Behavior normal.        Thought Content: Thought content normal.        Judgment: Judgment normal.     Labs reviewed: Basic Metabolic Panel: No results for input(s): "NA", "K", "CL", "CO2", "GLUCOSE", "BUN", "CREATININE", "CALCIUM", "MG", "PHOS", "TSH" in the last 8760 hours. Liver Function Tests: No results for input(s): "AST", "ALT", "ALKPHOS", "BILITOT", "PROT", "ALBUMIN" in the last 8760 hours. No results for input(s): "LIPASE", "AMYLASE" in the last 8760 hours. No results for input(s): "AMMONIA" in the last 8760 hours. CBC: No results for input(s): "WBC", "NEUTROABS", "HGB", "HCT", "MCV", "PLT" in the last 8760 hours. Lipid Panel: No results for input(s): "CHOL", "HDL",  "LDLCALC", "TRIG", "CHOLHDL", "LDLDIRECT" in the last 8760 hours. Lab Results  Component Value Date   HGBA1C 5.5 04/24/2019    Procedures since last visit: No results found.  Assessment/Plan  1. Mixed hyperlipidemia - continue Lipitor - Lipid Panel; Future  2. Crohn's disease of small intestine without complication ([place] -  has occasional abdominal cramps -  stable, not on medications - Lipid Panel; Future - TSH; Future  3. RA (refractory anemia) ([facility]) -  follows up with rheumatology  4. Migraine without status migrainosus, not intractable, unspecified migraine type -   takes Fioricet PRN  5. Attention deficit disorder, unspecified hyperactivity presence -  continue Bupropion and Methylphenidate  6. Visit for preventive health examination -   tsh and lipid panel -  follow up in 1 year   Labs/tests ordered:   Lipid panel and tsh  Next appt:  as needed

## 2022-05-26 NOTE — Patient Instructions (Signed)
Preventive Care 65 Years and Older, Female Preventive care refers to lifestyle choices and visits with your health care provider that can promote health and wellness. Preventive care visits are also called wellness exams. What can I expect for my preventive care visit? Counseling Your health care provider may ask you questions about your: Medical history, including: Past medical problems. Family medical history. Pregnancy and menstrual history. History of falls. Current health, including: Memory and ability to understand (cognition). Emotional well-being. Home life and relationship well-being. Sexual activity and sexual health. Lifestyle, including: Alcohol, nicotine or tobacco, and drug use. Access to firearms. Diet, exercise, and sleep habits. Work and work environment. Sunscreen use. Safety issues such as seatbelt and bike helmet use. Physical exam Your health care provider will check your: Height and weight. These may be used to calculate your BMI (body mass index). BMI is a measurement that tells if you are at a healthy weight. Waist circumference. This measures the distance around your waistline. This measurement also tells if you are at a healthy weight and may help predict your risk of certain diseases, such as type 2 diabetes and high blood pressure. Heart rate and blood pressure. Body temperature. Skin for abnormal spots. What immunizations do I need?  Vaccines are usually given at various ages, according to a schedule. Your health care provider will recommend vaccines for you based on your age, medical history, and lifestyle or other factors, such as travel or where you work. What tests do I need? Screening Your health care provider may recommend screening tests for certain conditions. This may include: Lipid and cholesterol levels. Hepatitis C test. Hepatitis B test. HIV (human immunodeficiency virus) test. STI (sexually transmitted infection) testing, if you are at  risk. Lung cancer screening. Colorectal cancer screening. Diabetes screening. This is done by checking your blood sugar (glucose) after you have not eaten for a while (fasting). Mammogram. Talk with your health care provider about how often you should have regular mammograms. BRCA-related cancer screening. This may be done if you have a family history of breast, ovarian, tubal, or peritoneal cancers. Bone density scan. This is done to screen for osteoporosis. Talk with your health care provider about your test results, treatment options, and if necessary, the need for more tests. Follow these instructions at home: Eating and drinking  Eat a diet that includes fresh fruits and vegetables, whole grains, lean protein, and low-fat dairy products. Limit your intake of foods with high amounts of sugar, saturated fats, and salt. Take vitamin and mineral supplements as recommended by your health care provider. Do not drink alcohol if your health care provider tells you not to drink. If you drink alcohol: Limit how much you have to 0-1 drink a day. Know how much alcohol is in your drink. In the [place] one drink equals one 12 oz bottle of beer (355 mL), one 5 oz glass of wine (148 mL), or one 1 oz glass of hard liquor (44 mL). Lifestyle Brush your teeth every morning and night with fluoride toothpaste. Floss one time each day. Exercise for at least 30 minutes 5 or more days each week. Do not use any products that contain nicotine or tobacco. These products include cigarettes, chewing tobacco, and vaping devices, such as e-cigarettes. If you need help quitting, ask your health care provider. Do not use drugs. If you are sexually active, practice safe sex. Use a condom or other form of protection in order to prevent STIs. Take aspirin only as told by   your health care provider. Make sure that you understand how much to take and what form to take. Work with your health care provider to find out whether it  is safe and beneficial for you to take aspirin daily. Ask your health care provider if you need to take a cholesterol-lowering medicine (statin). Find healthy ways to manage stress, such as: Meditation, yoga, or listening to music. Journaling. Talking to a trusted person. Spending time with friends and family. Minimize exposure to UV radiation to reduce your risk of skin cancer. Safety Always wear your seat belt while driving or riding in a vehicle. Do not drive: If you have been drinking alcohol. Do not ride with someone who has been drinking. When you are tired or distracted. While texting. If you have been using any mind-altering substances or drugs. Wear a helmet and other protective equipment during sports activities. If you have firearms in your house, make sure you follow all gun safety procedures. What's next? Visit your health care provider once a year for an annual wellness visit. Ask your health care provider how often you should have your eyes and teeth checked. Stay up to date on all vaccines. This information is not intended to replace advice given to you by your health care provider. Make sure you discuss any questions you have with your health care provider. Document Revised: 02/03/2021 Document Reviewed: 02/03/2021 Elsevier Patient Education  [employer].

## 2022-05-26 NOTE — Telephone Encounter (Signed)
Abstracted labs to chart.

## 2022-06-02 ENCOUNTER — Ambulatory Visit: Payer: BC Managed Care – PPO | Admitting: Orthopedic Surgery

## 2022-06-05 DIAGNOSIS — S92421A Displaced fracture of distal phalanx of right great toe, initial encounter for closed fracture: Secondary | ICD-10-CM | POA: Diagnosis not present

## 2022-06-13 DIAGNOSIS — S92424A Nondisplaced fracture of distal phalanx of right great toe, initial encounter for closed fracture: Secondary | ICD-10-CM | POA: Diagnosis not present

## 2022-06-13 DIAGNOSIS — M79671 Pain in right foot: Secondary | ICD-10-CM | POA: Diagnosis not present

## 2022-06-16 DIAGNOSIS — Z1231 Encounter for screening mammogram for malignant neoplasm of breast: Secondary | ICD-10-CM | POA: Diagnosis not present

## 2022-06-16 LAB — HM MAMMOGRAPHY

## 2022-06-27 DIAGNOSIS — M2560 Stiffness of unspecified joint, not elsewhere classified: Secondary | ICD-10-CM | POA: Diagnosis not present

## 2022-06-27 DIAGNOSIS — Z79899 Other long term (current) drug therapy: Secondary | ICD-10-CM | POA: Diagnosis not present

## 2022-06-27 DIAGNOSIS — M25559 Pain in unspecified hip: Secondary | ICD-10-CM | POA: Diagnosis not present

## 2022-06-27 DIAGNOSIS — M353 Polymyalgia rheumatica: Secondary | ICD-10-CM | POA: Diagnosis not present

## 2022-07-07 ENCOUNTER — Encounter: Payer: Self-pay | Admitting: Family

## 2022-07-13 DIAGNOSIS — S92424D Nondisplaced fracture of distal phalanx of right great toe, subsequent encounter for fracture with routine healing: Secondary | ICD-10-CM | POA: Diagnosis not present

## 2022-07-13 DIAGNOSIS — S92421A Displaced fracture of distal phalanx of right great toe, initial encounter for closed fracture: Secondary | ICD-10-CM | POA: Diagnosis not present

## 2022-07-20 DIAGNOSIS — Z79899 Other long term (current) drug therapy: Secondary | ICD-10-CM | POA: Diagnosis not present

## 2022-07-20 DIAGNOSIS — F331 Major depressive disorder, recurrent, moderate: Secondary | ICD-10-CM | POA: Diagnosis not present

## 2022-07-20 DIAGNOSIS — F900 Attention-deficit hyperactivity disorder, predominantly inattentive type: Secondary | ICD-10-CM | POA: Diagnosis not present

## 2022-07-25 ENCOUNTER — Other Ambulatory Visit: Payer: Self-pay

## 2022-07-25 DIAGNOSIS — R058 Other specified cough: Secondary | ICD-10-CM

## 2022-07-25 DIAGNOSIS — U071 COVID-19: Secondary | ICD-10-CM

## 2022-07-25 DIAGNOSIS — J069 Acute upper respiratory infection, unspecified: Secondary | ICD-10-CM

## 2022-07-25 DIAGNOSIS — Z20822 Contact with and (suspected) exposure to covid-19: Secondary | ICD-10-CM

## 2022-07-25 NOTE — Telephone Encounter (Signed)
Patient is requesting a medication usually given for temporary use. I will send request to Ngetich, Nelda Bucks, NP for further review

## 2022-07-25 NOTE — Telephone Encounter (Signed)
From: [name] To: Office of Sandrea Hughs, NP Sent: 07/25/2022 1:23 PM EST Subject: Medication Renewal Request  Refills have been requested for the following medications:   chlorpheniramine-HYDROcodone (TUSSIONEX PENNKINETIC ER) 10-8 MG/5ML Webb Silversmith C Ngetich]  Preferred pharmacy: [facility], [address] Delivery method: Arlyss Gandy

## 2022-07-26 ENCOUNTER — Encounter: Payer: Self-pay | Admitting: Adult Health

## 2022-07-26 ENCOUNTER — Other Ambulatory Visit: Payer: Self-pay | Admitting: Family

## 2022-07-26 DIAGNOSIS — J069 Acute upper respiratory infection, unspecified: Secondary | ICD-10-CM

## 2022-07-26 DIAGNOSIS — R051 Acute cough: Secondary | ICD-10-CM

## 2022-07-26 DIAGNOSIS — U071 COVID-19: Secondary | ICD-10-CM

## 2022-07-26 DIAGNOSIS — R058 Other specified cough: Secondary | ICD-10-CM

## 2022-07-26 MED ORDER — HYDROCOD POLI-CHLORPHE POLI ER 10-8 MG/5ML PO SUER: 5.0000 mL | Freq: Two times a day (BID) | ORAL | 0 refills | Status: DC | PRN

## 2022-07-26 NOTE — Telephone Encounter (Signed)
Request was Pended for approval Yesterday.   Pended Rx and sent to [facility] for approval.

## 2022-07-26 NOTE — Telephone Encounter (Signed)
Script already approved today.

## 2022-08-02 DIAGNOSIS — M353 Polymyalgia rheumatica: Secondary | ICD-10-CM | POA: Diagnosis not present

## 2022-08-02 DIAGNOSIS — M2560 Stiffness of unspecified joint, not elsewhere classified: Secondary | ICD-10-CM | POA: Diagnosis not present

## 2022-08-02 DIAGNOSIS — M79671 Pain in right foot: Secondary | ICD-10-CM | POA: Diagnosis not present

## 2022-08-02 DIAGNOSIS — M25559 Pain in unspecified hip: Secondary | ICD-10-CM | POA: Diagnosis not present

## 2022-08-02 DIAGNOSIS — Z79899 Other long term (current) drug therapy: Secondary | ICD-10-CM | POA: Diagnosis not present

## 2022-08-02 NOTE — Telephone Encounter (Signed)
Rx was sent on 07/26/2022 by Ngetich, Nelda Bucks, NP

## 2022-08-03 ENCOUNTER — Other Ambulatory Visit: Payer: Self-pay | Admitting: Obstetrics and Gynecology

## 2022-08-03 DIAGNOSIS — Z09 Encounter for follow-up examination after completed treatment for conditions other than malignant neoplasm: Secondary | ICD-10-CM

## 2022-08-05 ENCOUNTER — Other Ambulatory Visit: Payer: Self-pay | Admitting: Rheumatology

## 2022-08-05 DIAGNOSIS — Z79899 Other long term (current) drug therapy: Secondary | ICD-10-CM

## 2022-08-05 DIAGNOSIS — Z7952 Long term (current) use of systemic steroids: Secondary | ICD-10-CM

## 2022-08-05 DIAGNOSIS — R5381 Other malaise: Secondary | ICD-10-CM

## 2022-08-05 LAB — BASIC METABOLIC PANEL
BUN: 9 (ref 4–21)
CO2: 25 — AB (ref 13–22)
Chloride: 99 (ref 99–108)
Creatinine: 0.8 (ref 0.5–1.1)
Glucose: 95
Potassium: 4.8 mEq/L (ref 3.5–5.1)
Sodium: 137 (ref 137–147)

## 2022-08-05 LAB — HEPATIC FUNCTION PANEL
ALT: 20 U/L (ref 7–35)
AST: 21 (ref 13–35)
Alkaline Phosphatase: 126 — AB (ref 25–125)
Bilirubin, Total: 0.2

## 2022-08-05 LAB — COMPREHENSIVE METABOLIC PANEL
Albumin: 4.8 (ref 3.5–5.0)
Calcium: 9.6 (ref 8.7–10.7)
Globulin: 2.5
eGFR: 82

## 2022-08-10 ENCOUNTER — Encounter: Payer: Self-pay | Admitting: Adult Health

## 2022-08-11 ENCOUNTER — Encounter: Payer: Self-pay | Admitting: Family

## 2022-08-11 ENCOUNTER — Ambulatory Visit: Payer: BC Managed Care – PPO | Admitting: Family

## 2022-08-11 VITALS — BP 148/92 | HR 74 | Temp 97.9°F | Ht 65.0 in | Wt 147.0 lb

## 2022-08-11 DIAGNOSIS — J3489 Other specified disorders of nose and nasal sinuses: Secondary | ICD-10-CM | POA: Diagnosis not present

## 2022-08-11 DIAGNOSIS — R0981 Nasal congestion: Secondary | ICD-10-CM

## 2022-08-11 DIAGNOSIS — J0101 Acute recurrent maxillary sinusitis: Secondary | ICD-10-CM

## 2022-08-11 MED ORDER — LORATADINE 10 MG PO TABS: 10.0000 mg | ORAL_TABLET | Freq: Every day | ORAL | 0 refills | Status: DC

## 2022-08-11 MED ORDER — AMOXICILLIN-POT CLAVULANATE 875-125 MG PO TABS: 1.0000 | ORAL_TABLET | Freq: Two times a day (BID) | ORAL | 0 refills | Status: DC

## 2022-08-11 NOTE — Progress Notes (Signed)
Provider: Marlowe Sax FNP-C  Averiana Clouatre, Nelda Bucks, NP  Patient Care Team: Sindi Beckworth, Nelda Bucks, NP as PCP - General (Family Medicine) Deno Etienne, MD as Consulting Physician (Anesthesiology) Peri Maris, MD as Referring Physician (Student) Armbruster, [name], MD as Consulting Physician (Gastroenterology)  Extended Emergency Contact Information Primary Emergency Contact: [facility] Address: [address]          [place], [place] [address] Johnnette Litter of [place] Phone: [phone] Mobile Phone: [phone] Relation: Spouse  Code Status:  Full Code  Goals of care: Advanced Directive information    11/29/2021    2:25 PM  Advanced Directives  Does Patient Have a Medical Advance Directive? No  Would patient like information on creating a medical advance directive? No - Patient declined     Chief Complaint  Patient presents with   Acute Visit    Headache x 2 weeks, patient has tried Advil for relief and migraine medication.     HPI:  Pt is a 66 y.o. female seen today for an acute visit for evaluation of nasal congestion and headache.Has had sinus headache,pressure for couple of days. Has more pain when she bends forward.One week ago which improved then sinus headache and congestion started.  She denies any fever,chills,cough,fatigue,body aches,runny nose,chest tightness,chest pain,palpitation or shortness of breath.     Past Medical History:  Diagnosis Date   ADHD    Allergy    Crohn disease ([place])    Depression .   Hiatal hernia    Ileitis    Past Surgical History:  Procedure Laterality Date   ABDOMINAL HYSTERECTOMY     BREAST EXCISIONAL BIOPSY Right    BREAST SURGERY     Tumor Removal   CESAREAN SECTION     COLONOSCOPY  last 03/30/2018   at [facility]=polyps   KNEE SURGERY     TONSILLECTOMY      Allergies  Allergen Reactions   Etodolac Other (See Comments)    Sleepiness, feeling "out of it" Sleepiness, feeling "out of it"    Tizanidine  Hcl Other (See Comments)    Hallucinations.   Naproxen Other (See Comments)    "flu like symptoms"   Prednisone Other (See Comments)    Outpatient Encounter Medications as of 08/11/2022  Medication Sig   ammonium lactate (LAC-HYDRIN) 12 % lotion Apply 1 application topically as needed for dry skin.   atorvastatin (LIPITOR) 10 MG tablet TAKE 1 TABLET DAILY AT 6 P.M.   azelastine (ASTELIN) 0.1 % nasal spray Place 2 sprays into both nostrils 2 (two) times daily. Use in each nostril as directed   BUPROPION HCL PO Take 150 mg by mouth daily.   butalbital-acetaminophen-caffeine (FIORICET) 50-325-40 MG tablet Take 1 tablet by mouth every 6 (six) hours as needed for headache.   chlorpheniramine-HYDROcodone (TUSSIONEX) 10-8 MG/5ML Take 5 mLs by mouth every 12 (twelve) hours as needed for cough.   valACYclovir (VALTREX) 1000 MG tablet Take 1 tablet (1,000 mg total) by mouth 2 (two) times daily as needed.   [DISCONTINUED] Hyoscyamine Sulfate SL (LEVSIN/SL) 0.125 MG SUBL Place 1 tablet under the tongue every 6 (six) hours as needed. (Patient not taking: Reported on 08/13/2019)   [DISCONTINUED] Methylphenidate (COTEMPLA XR-ODT) 25.9 MG TBED Take 1 tablet by mouth in the morning.   No facility-administered encounter medications on file as of 08/11/2022.    Review of Systems  Constitutional:  Negative for appetite change, chills, fatigue, fever and unexpected weight change.  HENT:  Positive for congestion, rhinorrhea, sinus pressure  and sinus pain. Negative for dental problem, ear discharge, ear pain, facial swelling, hearing loss, nosebleeds, postnasal drip, sneezing, sore throat, tinnitus and trouble swallowing.   Eyes:  Negative for pain, discharge, redness, itching and visual disturbance.  Respiratory:  Negative for cough, chest tightness, shortness of breath and wheezing.   Cardiovascular:  Negative for chest pain, palpitations and leg swelling.  Gastrointestinal:  Negative for abdominal  distention, abdominal pain, blood in stool, constipation, diarrhea, nausea and vomiting.  Endocrine: Negative for cold intolerance, heat intolerance, polydipsia, polyphagia and polyuria.  Genitourinary:  Negative for difficulty urinating, dysuria, flank pain, frequency and urgency.  Musculoskeletal:  Negative for arthralgias, back pain, gait problem, joint swelling, myalgias, neck pain and neck stiffness.  Skin:  Negative for color change, pallor, rash and wound.  Neurological:  Positive for headaches. Negative for dizziness, syncope, speech difficulty, weakness, light-headedness and numbness.  Hematological:  Does not bruise/bleed easily.  Psychiatric/Behavioral:  Negative for agitation, behavioral problems, confusion, hallucinations, self-injury, sleep disturbance and suicidal ideas. The patient is not nervous/anxious.     Immunization History  Administered Date(s) Administered   Influenza,inj,Quad PF,6+ Mos 05/29/2018   Influenza-Unspecified 06/23/2011, 06/20/2012, 06/08/2015, 05/22/2016, 07/12/2019, 07/23/2020   PFIZER(Purple Top)SARS-COV-2 Vaccination 11/07/2019, 11/28/2019, 08/02/2020, 03/10/2021   Pneumococcal Polysaccharide-23 11/16/2016, 03/10/2021   Tdap 02/19/2006, 11/16/2016   Zoster Recombinat (Shingrix) 08/30/2017, 07/09/2019   Pertinent  Health Maintenance Due  Topic Date Due   DEXA SCAN  Never done   INFLUENZA VACCINE  03/22/2022   MAMMOGRAM  06/16/2024   COLONOSCOPY (Pts 45-72yr Insurance coverage will need to be confirmed)  08/18/2029      03/19/2021   11:07 AM 03/29/2021    4:39 PM 11/29/2021    2:25 PM 05/26/2022    8:36 AM 08/11/2022    9:22 AM  Fall Risk  Falls in the past year? 0 0 0 0 1  Was there an injury with Fall? 0 0 0 0 1  Was there an injury with Fall? - Comments     Broke big toe  Fall Risk Category Calculator 0 0 0 0 2  Fall Risk Category Low Low Low Low Moderate  Patient Fall Risk Level Low fall risk Low fall risk Low fall risk Low fall risk  Moderate fall risk  Patient at Risk for Falls Due to No Fall Risks No Fall Risks No Fall Risks No Fall Risks History of fall(s)  Fall risk Follow up Falls evaluation completed Falls evaluation completed Falls evaluation completed  Falls evaluation completed   Functional Status Survey:    Vitals:   08/11/22 0920  BP: (!) 148/92  Pulse: 74  Temp: 97.9 F (36.6 C)  TempSrc: Temporal  SpO2: 97%  Weight: 147 lb (66.7 kg)  Height: 5' 5"  (1.651 m)   Body mass index is 24.46 kg/m. Physical Exam  Labs reviewed: Recent Labs    04/29/22 0000  NA 139  K 5.0  CL 99  CO2 21  BUN 9  CREATININE 0.8  CALCIUM 10.4   Recent Labs    04/29/22 0000  AST 27  ALT 29  ALKPHOS 126*  ALBUMIN 5.0   Recent Labs    04/29/22 0000  WBC 8.9  NEUTROABS 6.10  HGB 13.0  HCT 37  PLT 352   Lab Results  Component Value Date   TSH 1.69 03/24/2021   Lab Results  Component Value Date   HGBA1C 5.5 04/24/2019   Lab Results  Component Value Date   CHOL 288 (H)  03/24/2021   HDL 82 03/24/2021   LDLCALC 170 (H) 03/24/2021   TRIG 203 (H) 03/24/2021   CHOLHDL 3.5 03/24/2021    Significant Diagnostic Results in last 30 days:  No results found.  Assessment/Plan 1. Acute recurrent maxillary sinusitis Left frontal and maxillary sinus tender to percussion. -Continue with saline rinse as needed -Start on loratadine as below -Will treat with Augmentin currently taking probiotic. - amoxicillin-clavulanate (AUGMENTIN) 875-125 MG tablet; Take 1 tablet by mouth 2 (two) times daily.  Dispense: 20 tablet; Refill: 0 -Advised to notify provider if symptoms worsen or fail to improve  2. Nasal congestion with rhinorrhea -Continue with saline rinse -Start on Claritin as below - loratadine (CLARITIN) 10 MG tablet; Take 1 tablet (10 mg total) by mouth daily for 14 days.  Dispense: 14 tablet; Refill: 0  Family/ staff Communication: Reviewed plan of care with patient verbalized  understanding  Labs/tests ordered: None   Next Appointment: Return in about 2 weeks (around 08/25/2022) for Fasting labs .   Sandrea Hughs, NP

## 2022-08-12 ENCOUNTER — Other Ambulatory Visit: Payer: Self-pay | Admitting: Family

## 2022-08-12 DIAGNOSIS — J0101 Acute recurrent maxillary sinusitis: Secondary | ICD-10-CM

## 2022-08-12 MED ORDER — DOXYCYCLINE HYCLATE 100 MG PO TABS: 100.0000 mg | ORAL_TABLET | Freq: Two times a day (BID) | ORAL | 0 refills | Status: AC

## 2022-08-12 NOTE — Progress Notes (Signed)
Doxycycline 100 mg tablet one by mouth twice daily x 7 days prescription send to pharmacy.

## 2022-08-24 ENCOUNTER — Other Ambulatory Visit: Payer: Self-pay | Admitting: Family

## 2022-08-24 DIAGNOSIS — R051 Acute cough: Secondary | ICD-10-CM

## 2022-08-25 NOTE — Telephone Encounter (Signed)
Patient has request refill on medication Chlorpheniramine Hydrocodone. Patient medication last refilled 07/26/2022. Patient has Opioid Contract on file dated 04/07/2021 for Fioricet. Patient has labs scheduled for 08/30/2022. Update Contract added to patient appointment notes due to patient not having upcoming in office visit. Medication pend and sent to Seth Bake, NP due to PCP Ngetich, Nelda Bucks, NP being out of office.

## 2022-08-30 ENCOUNTER — Other Ambulatory Visit: Payer: BC Managed Care – PPO

## 2022-08-30 DIAGNOSIS — K5000 Crohn's disease of small intestine without complications: Secondary | ICD-10-CM

## 2022-08-30 DIAGNOSIS — E782 Mixed hyperlipidemia: Secondary | ICD-10-CM

## 2022-09-08 DIAGNOSIS — M25559 Pain in unspecified hip: Secondary | ICD-10-CM | POA: Diagnosis not present

## 2022-09-08 DIAGNOSIS — M2550 Pain in unspecified joint: Secondary | ICD-10-CM | POA: Diagnosis not present

## 2022-09-08 DIAGNOSIS — M2560 Stiffness of unspecified joint, not elsewhere classified: Secondary | ICD-10-CM | POA: Diagnosis not present

## 2022-09-08 DIAGNOSIS — M791 Myalgia, unspecified site: Secondary | ICD-10-CM | POA: Diagnosis not present

## 2022-09-30 ENCOUNTER — Ambulatory Visit: Payer: BC Managed Care – PPO | Admitting: Family

## 2022-09-30 ENCOUNTER — Encounter: Payer: Self-pay | Admitting: Family

## 2022-09-30 VITALS — BP 121/78 | HR 73 | Temp 96.6°F | Resp 18 | Ht 65.0 in | Wt 146.1 lb

## 2022-09-30 DIAGNOSIS — R52 Pain, unspecified: Secondary | ICD-10-CM | POA: Diagnosis not present

## 2022-09-30 MED ORDER — GABAPENTIN 300 MG PO CAPS: 300.0000 mg | ORAL_CAPSULE | Freq: Two times a day (BID) | ORAL | 3 refills | Status: DC

## 2022-09-30 NOTE — Progress Notes (Signed)
Provider: Marlowe Sax FNP-C  Rubie Ficco, Nelda Bucks, NP  Patient Care Team: Svea Pusch, Nelda Bucks, NP as PCP - General (Family Medicine) Deno Etienne, MD as Consulting Physician (Anesthesiology) Peri Maris, MD as Referring Physician (Student) Armbruster, [name], MD as Consulting Physician (Gastroenterology)  Extended Emergency Contact Information Primary Emergency Contact: [facility] Address: [address]          [place], [place] [address] Johnnette Litter of [place] Phone: [phone] Mobile Phone: [phone] Relation: Spouse  Code Status:  Full Code  Goals of care: Advanced Directive information    11/29/2021    2:25 PM  Advanced Directives  Does Patient Have a Medical Advance Directive? No  Would patient like information on creating a medical advance directive? No - Patient declined     Chief Complaint  Patient presents with   Medical Management of Chronic Issues    Pain in the back and legs and all over the body.Rheumatologist has asked to come in to see Delyla Sandeen C, NP     HPI:  Pt is a 67 y.o. female seen today for an acute visit for evaluation of pain on the legs,knees,upper/lower back and shoulders.states was seen by orthopedic who referred her to rheumatologist.But was told not arthritic pain. On chart review,patient was seen 08/29/2022 by [facility] for arthralgia of multiple joints,mylagia,hip ,back pain and joint stiffness.Lab work done were unremarkable including negative  Rheumatoid factor 13.1    Past Medical History:  Diagnosis Date   ADHD    Allergy    Crohn disease ([place])    Depression .   Hiatal hernia    Ileitis    Past Surgical History:  Procedure Laterality Date   ABDOMINAL HYSTERECTOMY     BREAST EXCISIONAL BIOPSY Right    BREAST SURGERY     Tumor Removal   CESAREAN SECTION     COLONOSCOPY  last 03/30/2018   at [facility]=polyps   KNEE SURGERY     TONSILLECTOMY      Allergies  Allergen  Reactions   Etodolac Other (See Comments)    Sleepiness, feeling "out of it" Sleepiness, feeling "out of it"    Tizanidine Hcl Other (See Comments)    Hallucinations.   Naproxen Other (See Comments)    "flu like symptoms"   Prednisone Other (See Comments)    Outpatient Encounter Medications as of 09/30/2022  Medication Sig   ammonium lactate (LAC-HYDRIN) 12 % lotion Apply 1 application topically as needed for dry skin.   atorvastatin (LIPITOR) 10 MG tablet TAKE 1 TABLET DAILY AT 6 P.M.   azelastine (ASTELIN) 0.1 % nasal spray Place 2 sprays into both nostrils 2 (two) times daily. Use in each nostril as directed   BUPROPION HCL PO Take 150 mg by mouth daily.   butalbital-acetaminophen-caffeine (FIORICET) 50-325-40 MG tablet Take 1 tablet by mouth every 6 (six) hours as needed for headache.   chlorpheniramine-HYDROcodone (TUSSIONEX) 10-8 MG/5ML TAKE 5 mls BY MOUTH EVERY TWELVE HOURS AS NEEDED FOR cough   valACYclovir (VALTREX) 1000 MG tablet Take 1 tablet (1,000 mg total) by mouth 2 (two) times daily as needed.   [DISCONTINUED] Hyoscyamine Sulfate SL (LEVSIN/SL) 0.125 MG SUBL Place 1 tablet under the tongue every 6 (six) hours as needed.   [DISCONTINUED] loratadine (CLARITIN) 10 MG tablet Take 1 tablet (10 mg total) by mouth daily for 14 days.   No facility-administered encounter medications on file as of 09/30/2022.    Review of Systems  Constitutional:  Negative for  appetite change, chills, fatigue, fever and unexpected weight change.  HENT:  Negative for congestion, dental problem, ear discharge, ear pain, facial swelling, hearing loss, nosebleeds, postnasal drip, rhinorrhea, sinus pressure, sinus pain, sneezing, sore throat, tinnitus and trouble swallowing.   Eyes:  Negative for pain, discharge, redness, itching and visual disturbance.  Respiratory:  Negative for cough, chest tightness, shortness of breath and wheezing.   Cardiovascular:  Negative for chest pain, palpitations and leg  swelling.  Gastrointestinal:  Negative for abdominal distention, abdominal pain, blood in stool, constipation, diarrhea, nausea and vomiting.  Endocrine: Negative for cold intolerance, heat intolerance, polydipsia, polyphagia and polyuria.  Genitourinary:  Negative for difficulty urinating, dysuria, flank pain, frequency and urgency.  Musculoskeletal:  Positive for arthralgias, back pain and myalgias. Negative for gait problem, joint swelling, neck pain and neck stiffness.       Reports pain on hips,lower back ,anterior thigh,groin ,calf muscle and shoulders.   Skin:  Negative for color change, pallor, rash and wound.  Neurological:  Negative for dizziness, syncope, speech difficulty, weakness, light-headedness, numbness and headaches.  Hematological:  Does not bruise/bleed easily.  Psychiatric/Behavioral:  Negative for agitation, behavioral problems, confusion, hallucinations, self-injury, sleep disturbance and suicidal ideas. The patient is not nervous/anxious.     Immunization History  Administered Date(s) Administered   Influenza,inj,Quad PF,6+ Mos 05/29/2018   Influenza-Unspecified 06/23/2011, 06/20/2012, 06/08/2015, 05/22/2016, 07/12/2019, 07/23/2020   PFIZER(Purple Top)SARS-COV-2 Vaccination 11/07/2019, 11/28/2019, 08/02/2020, 03/10/2021   Pneumococcal Polysaccharide-23 11/16/2016, 03/10/2021   Tdap 02/19/2006, 11/16/2016   Zoster Recombinat (Shingrix) 08/30/2017, 07/09/2019   Pertinent  Health Maintenance Due  Topic Date Due   DEXA SCAN  Never done   INFLUENZA VACCINE  03/22/2022   MAMMOGRAM  06/16/2024   COLONOSCOPY (Pts 45-39yr Insurance coverage will need to be confirmed)  08/18/2029      03/29/2021    4:39 PM 11/29/2021    2:25 PM 05/26/2022    8:36 AM 08/11/2022    9:22 AM 09/30/2022    2:36 PM  Fall Risk  Falls in the past year? 0 0 0 1 0  Was there an injury with Fall? 0 0 0 1 0  Was there an injury with Fall? - Comments    Broke big toe   Fall Risk Category  Calculator 0 0 0 2 0  Fall Risk Category (Retired) Low Low Low Moderate   (RETIRED) Patient Fall Risk Level Low fall risk Low fall risk Low fall risk Moderate fall risk   Patient at Risk for Falls Due to No Fall Risks No Fall Risks No Fall Risks History of fall(s) History of fall(s)  Fall risk Follow up Falls evaluation completed Falls evaluation completed  Falls evaluation completed Falls evaluation completed   Functional Status Survey:    Vitals:   09/30/22 1437  BP: 121/78  Pulse: 73  Resp: 18  Temp: (!) 96.6 F (35.9 C)  SpO2: 97%  Weight: 146 lb 2 oz (66.3 kg)  Height: 5' 5"$  (1.651 m)   Body mass index is 24.32 kg/m. Physical Exam Vitals reviewed.  Constitutional:      General: She is not in acute distress.    Appearance: Normal appearance. She is normal weight. She is not ill-appearing or diaphoretic.  HENT:     Head: Normocephalic.     Right Ear: Tympanic membrane, ear canal and external ear normal. There is no impacted cerumen.     Left Ear: Tympanic membrane, ear canal and external ear normal. There is no impacted cerumen.  Nose: Nose normal. No congestion or rhinorrhea.     Mouth/Throat:     Mouth: Mucous membranes are moist.     Pharynx: Oropharynx is clear. No oropharyngeal exudate or posterior oropharyngeal erythema.  Eyes:     General: No scleral icterus.       Right eye: No discharge.        Left eye: No discharge.     Extraocular Movements: Extraocular movements intact.     Conjunctiva/sclera: Conjunctivae normal.     Pupils: Pupils are equal, round, and reactive to light.  Neck:     Vascular: No carotid bruit.  Cardiovascular:     Rate and Rhythm: Normal rate and regular rhythm.     Pulses: Normal pulses.     Heart sounds: Normal heart sounds. No murmur heard.    No friction rub. No gallop.  Pulmonary:     Effort: Pulmonary effort is normal. No respiratory distress.     Breath sounds: Normal breath sounds. No wheezing, rhonchi or rales.  Chest:      Chest wall: No tenderness.  Abdominal:     General: Bowel sounds are normal. There is no distension.     Palpations: Abdomen is soft. There is no mass.     Tenderness: There is no abdominal tenderness. There is no right CVA tenderness, left CVA tenderness, guarding or rebound.  Musculoskeletal:        General: No swelling. Normal range of motion.     Cervical back: Normal range of motion. No rigidity or tenderness.     Right lower leg: No edema.     Left lower leg: No edema.     Comments: Tender on multiple joints but no swelling or erythema  Lymphadenopathy:     Cervical: No cervical adenopathy.  Skin:    General: Skin is warm and dry.     Coloration: Skin is not pale.     Findings: No bruising, erythema, lesion or rash.  Neurological:     Mental Status: She is alert and oriented to person, place, and time.     Cranial Nerves: No cranial nerve deficit.     Sensory: No sensory deficit.     Motor: No weakness.     Coordination: Coordination normal.     Gait: Gait normal.  Psychiatric:        Mood and Affect: Mood normal.        Speech: Speech normal.        Behavior: Behavior normal.     Labs reviewed: Recent Labs    04/29/22 0000  NA 139  K 5.0  CL 99  CO2 21  BUN 9  CREATININE 0.8  CALCIUM 10.4   Recent Labs    04/29/22 0000  AST 27  ALT 29  ALKPHOS 126*  ALBUMIN 5.0   Recent Labs    04/29/22 0000  WBC 8.9  NEUTROABS 6.10  HGB 13.0  HCT 37  PLT 352   Lab Results  Component Value Date   TSH 1.69 03/24/2021   Lab Results  Component Value Date   HGBA1C 5.5 04/24/2019   Lab Results  Component Value Date   CHOL 288 (H) 03/24/2021   HDL 82 03/24/2021   LDLCALC 170 (H) 03/24/2021   TRIG 203 (H) 03/24/2021   CHOLHDL 3.5 03/24/2021    Significant Diagnostic Results in last 30 days:  No results found.  Assessment/Plan Generalized pain Reports pain on multiple sites on the legs,knees,upper/lower back and shoulders.she was  referred to  Orthopedic but thought possible RA so was referred to Rheumatologist.RF was negative.  Tender on multiple joints but without any swelling or erythema. Discussed referral to pain management clinic for further evaluation of pain - continue on Gabapentin  - Ambulatory referral to Pain Clinic  Family/ staff Communication: Reviewed plan of care with patient verbalized understanding.   Labs/tests ordered: None   Next Appointment: Return if symptoms worsen or fail to improve.   Sandrea Hughs, NP

## 2022-10-03 NOTE — Telephone Encounter (Signed)
Will have lab work abstracted to your chart.Looks like Thyroid and Lipid was not checked.Please schedule a lab appointment to draw TSH and Lipid panel.

## 2022-10-11 ENCOUNTER — Other Ambulatory Visit: Payer: BC Managed Care – PPO

## 2022-10-12 ENCOUNTER — Other Ambulatory Visit: Payer: BC Managed Care – PPO

## 2022-10-12 DIAGNOSIS — E782 Mixed hyperlipidemia: Secondary | ICD-10-CM | POA: Diagnosis not present

## 2022-10-12 DIAGNOSIS — K5000 Crohn's disease of small intestine without complications: Secondary | ICD-10-CM | POA: Diagnosis not present

## 2022-10-13 DIAGNOSIS — Z79891 Long term (current) use of opiate analgesic: Secondary | ICD-10-CM | POA: Diagnosis not present

## 2022-10-13 DIAGNOSIS — M545 Low back pain, unspecified: Secondary | ICD-10-CM | POA: Diagnosis not present

## 2022-10-13 LAB — TSH: TSH: 2.16 mIU/L (ref 0.40–4.50)

## 2022-10-13 LAB — LIPID PANEL
Cholesterol: 233 mg/dL — ABNORMAL HIGH (ref <200)
HDL: 84 mg/dL (ref >=50)
LDL Cholesterol (Calc): 117 mg/dL (calc) — ABNORMAL HIGH
Non-HDL Cholesterol (Calc): 149 mg/dL (calc) — ABNORMAL HIGH (ref <130)
Total CHOL/HDL Ratio: 2.8 (calc) (ref <5.0)
Triglycerides: 205 mg/dL — ABNORMAL HIGH (ref <150)

## 2022-10-13 NOTE — Progress Notes (Signed)
-    cholesterol 233, down from 288 a year ago (normal <200) -  triglycerides 205, almost same as 203 a year ago (normal <150) -  LDL 117, down from 170 a year ago (normal <100) -  continue Atorvastatin, restrict dietary fats and avoid alcohol

## 2022-10-21 DIAGNOSIS — Z79891 Long term (current) use of opiate analgesic: Secondary | ICD-10-CM | POA: Diagnosis not present

## 2022-10-21 DIAGNOSIS — F331 Major depressive disorder, recurrent, moderate: Secondary | ICD-10-CM | POA: Diagnosis not present

## 2022-10-21 DIAGNOSIS — F900 Attention-deficit hyperactivity disorder, predominantly inattentive type: Secondary | ICD-10-CM | POA: Diagnosis not present

## 2022-10-21 DIAGNOSIS — Z5181 Encounter for therapeutic drug level monitoring: Secondary | ICD-10-CM | POA: Diagnosis not present

## 2022-10-21 DIAGNOSIS — Z79899 Other long term (current) drug therapy: Secondary | ICD-10-CM | POA: Diagnosis not present

## 2022-11-09 ENCOUNTER — Other Ambulatory Visit (HOSPITAL_BASED_OUTPATIENT_CLINIC_OR_DEPARTMENT_OTHER): Payer: Self-pay | Admitting: Physician Assistant

## 2022-11-09 ENCOUNTER — Ambulatory Visit (HOSPITAL_BASED_OUTPATIENT_CLINIC_OR_DEPARTMENT_OTHER)
Admission: RE | Admit: 2022-11-09 | Discharge: 2022-11-09 | Disposition: A | Discharge status: Home / Self Care | Payer: BC Managed Care – PPO | Source: Ambulatory Visit | Attending: Physician Assistant | Admitting: Physician Assistant

## 2022-11-09 DIAGNOSIS — M542 Cervicalgia: Secondary | ICD-10-CM | POA: Insufficient documentation

## 2022-11-11 DIAGNOSIS — M545 Low back pain, unspecified: Secondary | ICD-10-CM | POA: Diagnosis not present

## 2022-11-23 NOTE — Telephone Encounter (Signed)
Message routed to Ngetich, Dinah C, NP  

## 2022-12-07 ENCOUNTER — Ambulatory Visit: Payer: BC Managed Care – PPO | Admitting: Family

## 2022-12-07 ENCOUNTER — Encounter: Payer: Self-pay | Admitting: Family

## 2022-12-07 VITALS — BP 120/82 | HR 66 | Temp 97.8°F | Resp 16 | Ht 65.0 in | Wt 145.2 lb

## 2022-12-07 DIAGNOSIS — M542 Cervicalgia: Secondary | ICD-10-CM

## 2022-12-07 DIAGNOSIS — M25651 Stiffness of right hip, not elsewhere classified: Secondary | ICD-10-CM | POA: Diagnosis not present

## 2022-12-07 DIAGNOSIS — R52 Pain, unspecified: Secondary | ICD-10-CM

## 2022-12-07 DIAGNOSIS — M25652 Stiffness of left hip, not elsewhere classified: Secondary | ICD-10-CM

## 2022-12-07 DIAGNOSIS — M5030 Other cervical disc degeneration, unspecified cervical region: Secondary | ICD-10-CM

## 2022-12-07 NOTE — Progress Notes (Signed)
Provider: Richarda Blade FNP-C  Ulric Salzman, Donalee Citrin, NP  Patient Care Team: Eldridge Marcott, Donalee Citrin, NP as PCP - General (Family Medicine) Johny Drilling, MD as Consulting Physician (Anesthesiology) Minus Breeding, MD as Referring Physician (Student) Armbruster, Willaim Rayas, MD as Consulting Physician (Gastroenterology)  Extended Emergency Contact Information Primary Emergency Contact: [facility] Address: [address]          [place], [place] [address] Darden Amber of [place] Home Phone: [phone] Mobile Phone: [phone] Relation: Spouse  Code Status: Full Code  Goals of care: Advanced Directive information    11/29/2021    2:25 PM  Advanced Directives  Does Patient Have a Medical Advance Directive? No  Would patient like information on creating a medical advance directive? No - Patient declined     Chief Complaint  Patient presents with   Follow-up    Discuss previous visits with other doctors.     HPI:  Pt is a 67 y.o. female seen today for an acute visit for evaluation of neck pain,generalized pain and joint stiffness.states was seen by pain management specialist pain patch ordered but broke out with a rash on upper arm application site.Patch was discontinued and Bupropion was ordered but states did not work for her.she notified pain specialist    Past Medical History:  Diagnosis Date   ADHD    Allergy    Crohn disease    Depression .   Hiatal hernia    Ileitis    Past Surgical History:  Procedure Laterality Date   ABDOMINAL HYSTERECTOMY     BREAST EXCISIONAL BIOPSY Right    BREAST SURGERY     Tumor Removal   CESAREAN SECTION     COLONOSCOPY  last 03/30/2018   at [facility]=polyps   KNEE SURGERY     TONSILLECTOMY      Allergies  Allergen Reactions   Etodolac Other (See Comments)    Sleepiness, feeling "out of it" Sleepiness, feeling "out of it"    Tizanidine Hcl Other (See Comments)    Hallucinations.   Naproxen Other (See Comments)    "flu  like symptoms"   Prednisone Other (See Comments)    Outpatient Encounter Medications as of 12/07/2022  Medication Sig   ammonium lactate (LAC-HYDRIN) 12 % lotion Apply 1 application topically as needed for dry skin.   atorvastatin (LIPITOR) 10 MG tablet TAKE 1 TABLET DAILY AT 6 P.M.   azelastine (ASTELIN) 0.1 % nasal spray Place 2 sprays into both nostrils 2 (two) times daily. Use in each nostril as directed   BUPROPION HCL PO Take 150 mg by mouth daily.   butalbital-acetaminophen-caffeine (FIORICET) 50-325-40 MG tablet Take 1 tablet by mouth every 6 (six) hours as needed for headache.   gabapentin (NEURONTIN) 300 MG capsule Take 1 capsule (300 mg total) by mouth 2 (two) times daily.   valACYclovir (VALTREX) 1000 MG tablet Take 1 tablet (1,000 mg total) by mouth 2 (two) times daily as needed.   chlorpheniramine-HYDROcodone (TUSSIONEX) 10-8 MG/5ML TAKE 5 mls BY MOUTH EVERY TWELVE HOURS AS NEEDED FOR cough (Patient not taking: Reported on 12/07/2022)   [DISCONTINUED] Hyoscyamine Sulfate SL (LEVSIN/SL) 0.125 MG SUBL Place 1 tablet under the tongue every 6 (six) hours as needed.   No facility-administered encounter medications on file as of 12/07/2022.    Review of Systems  Constitutional:  Negative for appetite change, chills, fatigue, fever and unexpected weight change.  HENT:  Negative for congestion, dental problem, ear discharge, ear pain, facial swelling, hearing loss, nosebleeds, postnasal  drip, rhinorrhea, sinus pressure, sinus pain, sneezing, sore throat, tinnitus and trouble swallowing.   Eyes:  Negative for pain, discharge, redness, itching and visual disturbance.  Respiratory:  Negative for cough, chest tightness, shortness of breath and wheezing.   Cardiovascular:  Negative for chest pain, palpitations and leg swelling.  Gastrointestinal:  Negative for abdominal distention, abdominal pain, blood in stool, constipation, diarrhea, nausea and vomiting.  Endocrine: Negative for cold  intolerance, heat intolerance, polydipsia, polyphagia and polyuria.  Genitourinary:  Negative for difficulty urinating, dysuria, flank pain, frequency and urgency.  Musculoskeletal:  Positive for arthralgias, myalgias and neck pain. Negative for back pain, gait problem, joint swelling and neck stiffness.       Generalized aches and stiffness on both hips   Skin:  Negative for color change, pallor, rash and wound.  Neurological:  Negative for dizziness, syncope, speech difficulty, weakness, light-headedness, numbness and headaches.  Hematological:  Does not bruise/bleed easily.  Psychiatric/Behavioral:  Negative for agitation, behavioral problems, confusion, hallucinations, self-injury, sleep disturbance and suicidal ideas. The patient is not nervous/anxious.     Immunization History  Administered Date(s) Administered   Influenza,inj,Quad PF,6+ Mos 05/29/2018   Influenza-Unspecified 06/23/2011, 06/20/2012, 06/08/2015, 05/22/2016, 07/12/2019, 07/23/2020   PFIZER(Purple Top)SARS-COV-2 Vaccination 11/07/2019, 11/28/2019, 08/02/2020, 03/10/2021   Pneumococcal Polysaccharide-23 11/16/2016, 03/10/2021   Tdap 02/19/2006, 11/16/2016   Zoster Recombinat (Shingrix) 08/30/2017, 07/09/2019   Pertinent  Health Maintenance Due  Topic Date Due   DEXA SCAN  Never done   INFLUENZA VACCINE  03/23/2023   MAMMOGRAM  06/16/2024   COLONOSCOPY (Pts 45-36yrs Insurance coverage will need to be confirmed)  08/18/2029      03/29/2021    4:39 PM 11/29/2021    2:25 PM 05/26/2022    8:36 AM 08/11/2022    9:22 AM 09/30/2022    2:36 PM  Fall Risk  Falls in the past year? 0 0 0 1 0  Was there an injury with Fall? 0 0 0 1 0  Was there an injury with Fall? - Comments    Broke big toe   Fall Risk Category Calculator 0 0 0 2 0  Fall Risk Category (Retired) Low Low Low Moderate   (RETIRED) Patient Fall Risk Level Low fall risk Low fall risk Low fall risk Moderate fall risk   Patient at Risk for Falls Due to No Fall Risks  No Fall Risks No Fall Risks History of fall(s) History of fall(s)  Fall risk Follow up Falls evaluation completed Falls evaluation completed  Falls evaluation completed Falls evaluation completed   Functional Status Survey: Is the patient deaf or have difficulty hearing?: No Does the patient have difficulty seeing, even when wearing glasses/contacts?: No Does the patient have difficulty concentrating, remembering, or making decisions?: No Does the patient have difficulty walking or climbing stairs?: No Does the patient have difficulty dressing or bathing?: No Does the patient have difficulty doing errands alone such as visiting a doctor's office or shopping?: No  Vitals:   12/07/22 1112  BP: 120/82  Pulse: 66  Resp: 16  Temp: 97.8 F (36.6 C)  TempSrc: Temporal  SpO2: 99%  Weight: 145 lb 3.2 oz (65.9 kg)  Height: 5\' 5"  (1.651 m)   Body mass index is 24.16 kg/m. Physical Exam Vitals reviewed.  Constitutional:      General: She is not in acute distress.    Appearance: Normal appearance. She is normal weight. She is not ill-appearing or diaphoretic.  HENT:     Head: Normocephalic.  Right Ear: Tympanic membrane, ear canal and external ear normal. There is no impacted cerumen.     Left Ear: Tympanic membrane, ear canal and external ear normal. There is no impacted cerumen.     Nose: Nose normal. No congestion or rhinorrhea.     Mouth/Throat:     Mouth: Mucous membranes are moist.     Pharynx: Oropharynx is clear. No oropharyngeal exudate or posterior oropharyngeal erythema.  Eyes:     General: No scleral icterus.       Right eye: No discharge.        Left eye: No discharge.     Extraocular Movements: Extraocular movements intact.     Conjunctiva/sclera: Conjunctivae normal.     Pupils: Pupils are equal, round, and reactive to light.  Neck:     Vascular: No carotid bruit.  Cardiovascular:     Rate and Rhythm: Normal rate and regular rhythm.     Pulses: Normal pulses.      Heart sounds: Normal heart sounds. No murmur heard.    No friction rub. No gallop.  Pulmonary:     Effort: Pulmonary effort is normal. No respiratory distress.     Breath sounds: Normal breath sounds. No wheezing, rhonchi or rales.  Chest:     Chest wall: No tenderness.  Abdominal:     General: Bowel sounds are normal. There is no distension.     Palpations: Abdomen is soft. There is no mass.     Tenderness: There is no abdominal tenderness. There is no right CVA tenderness, left CVA tenderness, guarding or rebound.  Musculoskeletal:        General: No swelling or tenderness. Normal range of motion.     Cervical back: Normal range of motion. No rigidity or tenderness.     Right lower leg: No edema.     Left lower leg: No edema.     Comments: Arthritis changes on fingers but no joint swelling   Tender to palpation over scattered areas  Lymphadenopathy:     Cervical: No cervical adenopathy.  Skin:    General: Skin is warm and dry.     Coloration: Skin is not pale.     Findings: No bruising, erythema, lesion or rash.  Neurological:     Mental Status: She is alert and oriented to person, place, and time.     Cranial Nerves: No cranial nerve deficit.     Sensory: No sensory deficit.     Motor: No weakness.     Coordination: Coordination normal.     Gait: Gait normal.  Psychiatric:        Mood and Affect: Mood normal.        Speech: Speech normal.        Behavior: Behavior normal.        Thought Content: Thought content normal.        Judgment: Judgment normal.     Labs reviewed: Recent Labs    04/29/22 0000 08/05/22 0000  NA 139 137  K 5.0 4.8  CL 99 99  CO2 21 25*  BUN 9 9  CREATININE 0.8 0.8  CALCIUM 10.4 9.6   Recent Labs    04/29/22 0000 08/05/22 0000  AST 27 21  ALT 29 20  ALKPHOS 126* 126*  ALBUMIN 5.0 4.8   Recent Labs    04/29/22 0000  WBC 8.9  NEUTROABS 6.10  HGB 13.0  HCT 37  PLT 352   Lab Results  Component Value Date  TSH 2.16  10/12/2022   Lab Results  Component Value Date   HGBA1C 5.5 04/24/2019   Lab Results  Component Value Date   CHOL 233 (H) 10/12/2022   HDL 84 10/12/2022   LDLCALC 117 (H) 10/12/2022   TRIG 205 (H) 10/12/2022   CHOLHDL 2.8 10/12/2022    Significant Diagnostic Results in last 30 days:  DG Cervical Spine 2 or 3 views  Result Date: 11/11/2022 CLINICAL DATA:  Cervicalgia EXAM: CERVICAL SPINE - 2-3 VIEW COMPARISON:  None Available. FINDINGS: No fracture, dislocation or subluxation. No spondylolisthesis. No osteolytic or osteoblastic changes. Prevertebral and cervical cranial soft tissues are unremarkable. Degenerative disc disease noted with disc space narrowing and marginal osteophytes at C4-5 and C5-6. IMPRESSION: Degenerative changes. No acute osseous abnormalities. Electronically Signed   By: Layla Maw M.D.   On: 11/11/2022 20:49    Assessment/Plan 1. Cervicalgia Chronic  Recent X-ray indicates degenerative disease  Will refer to Neurosurgery as below  PT for joint stiffness  - continue current pain regimen - Ambulatory referral to Neurosurgery - Ambulatory referral to Physical Therapy  2. DDD (degenerative disc disease), cervical Noted on recent cervical X-ray  - Ambulatory referral to Neurosurgery - Ambulatory referral to Physical Therapy  3. Generalized pain Unclear etiology requested testing for Lyme disease. - B. burgdorfi Antibody  4. Joint stiffness of both hips Chronic  - will rule out other  - Ambulatory referral to Physical Therapy - B. burgdorfi Antibody  5. Generalized body aches Continue current pain management with  - B. burgdorfi Antibody    Family/ staff Communication: Reviewed plan of care with patient  Labs/tests ordered:  - B. burgdorfi Antibody   Next Appointment: Return if symptoms worsen or fail to improve.   Caesar Bookman, NP

## 2022-12-08 LAB — B. BURGDORFI ANTIBODIES: B burgdorferi Ab IgG+IgM: <0.9 index

## 2022-12-11 ENCOUNTER — Other Ambulatory Visit: Payer: Self-pay | Admitting: Family

## 2022-12-11 DIAGNOSIS — G43909 Migraine, unspecified, not intractable, without status migrainosus: Secondary | ICD-10-CM

## 2022-12-12 ENCOUNTER — Encounter (HOSPITAL_BASED_OUTPATIENT_CLINIC_OR_DEPARTMENT_OTHER): Payer: Self-pay

## 2022-12-12 ENCOUNTER — Ambulatory Visit (HOSPITAL_BASED_OUTPATIENT_CLINIC_OR_DEPARTMENT_OTHER): Payer: BC Managed Care – PPO | Admitting: Physical Therapy

## 2022-12-12 NOTE — Telephone Encounter (Signed)
Pharmacy requested refill.  ?Pended Rx and sent to Dinah for approval.  ?

## 2022-12-13 DIAGNOSIS — M47816 Spondylosis without myelopathy or radiculopathy, lumbar region: Secondary | ICD-10-CM | POA: Diagnosis not present

## 2022-12-13 DIAGNOSIS — M545 Low back pain, unspecified: Secondary | ICD-10-CM | POA: Diagnosis not present

## 2022-12-19 DIAGNOSIS — M47816 Spondylosis without myelopathy or radiculopathy, lumbar region: Secondary | ICD-10-CM | POA: Diagnosis not present

## 2023-01-12 ENCOUNTER — Other Ambulatory Visit: Payer: Self-pay

## 2023-01-12 ENCOUNTER — Ambulatory Visit (HOSPITAL_BASED_OUTPATIENT_CLINIC_OR_DEPARTMENT_OTHER): Payer: BC Managed Care – PPO | Attending: Family | Admitting: Physical Therapy

## 2023-01-12 ENCOUNTER — Encounter (HOSPITAL_BASED_OUTPATIENT_CLINIC_OR_DEPARTMENT_OTHER): Payer: Self-pay | Admitting: Physical Therapy

## 2023-01-12 ENCOUNTER — Other Ambulatory Visit: Payer: Self-pay | Admitting: Family

## 2023-01-12 DIAGNOSIS — M25652 Stiffness of left hip, not elsewhere classified: Secondary | ICD-10-CM | POA: Diagnosis not present

## 2023-01-12 DIAGNOSIS — M6281 Muscle weakness (generalized): Secondary | ICD-10-CM | POA: Insufficient documentation

## 2023-01-12 DIAGNOSIS — M25551 Pain in right hip: Secondary | ICD-10-CM | POA: Insufficient documentation

## 2023-01-12 DIAGNOSIS — M25552 Pain in left hip: Secondary | ICD-10-CM | POA: Diagnosis not present

## 2023-01-12 DIAGNOSIS — M25651 Stiffness of right hip, not elsewhere classified: Secondary | ICD-10-CM | POA: Insufficient documentation

## 2023-01-12 DIAGNOSIS — M542 Cervicalgia: Secondary | ICD-10-CM | POA: Diagnosis not present

## 2023-01-12 DIAGNOSIS — E782 Mixed hyperlipidemia: Secondary | ICD-10-CM

## 2023-01-12 MED ORDER — ATORVASTATIN CALCIUM 20 MG PO TABS: 20.0000 mg | ORAL_TABLET | Freq: Every day | ORAL | 1 refills | Status: DC

## 2023-01-12 NOTE — Therapy (Signed)
OUTPATIENT PHYSICAL THERAPY LOWER EXTREMITY EVALUATION   Patient Name: Hailey Galvan MRN: [id number] DOB:06-26-1956, 67 y.o., female Today's Date: 01/12/2023  END OF SESSION:  PT End of Session - 01/12/23 1523     Visit Number 1    Number of Visits 13    Date for PT Re-Evaluation 04/12/23    Authorization Type BC[employer]    PT Start Time 1530    PT Stop Time 1610    PT Time Calculation (min) 40 min    Activity Tolerance Patient tolerated treatment well    Behavior During Therapy WFL for tasks assessed/performed             Past Medical History:  Diagnosis Date   ADHD    Allergy    Crohn disease (HCC)    Depression .   Hiatal hernia    Ileitis    Past Surgical History:  Procedure Laterality Date   ABDOMINAL HYSTERECTOMY     BREAST EXCISIONAL BIOPSY Right    BREAST SURGERY     Tumor Removal   CESAREAN SECTION     COLONOSCOPY  last 03/30/2018   at [facility]=polyps   KNEE SURGERY     TONSILLECTOMY     Patient Active Problem List   Diagnosis Date Noted   Heel callus 05/07/2021   Skin fissures 05/07/2021   Hav (hallux abducto valgus), unspecified laterality 05/07/2021   Hammer toe, acquired 05/07/2021   Eustachian tube dysfunction, left 03/24/2021   Nasal septal deviation 03/24/2021   Perceived hearing changes 03/24/2021   ADHD    Allergy    Hiatal hernia    Crohn's disease (HCC) 04/16/2019   Breast mass, right 10/18/2018   Healthcare maintenance 09/06/2018   Ileitis 09/06/2018   Pain in both hands 08/03/2015   Neuralgia and neuritis, unspecified 12/05/2012   S/P arthroscopic knee surgery 10/01/2012   Hyperlipidemia 11/15/2011    PCP: Ngetich, Donalee Citrin, NP   REFERRING PROVIDER:  Ngetich, Donalee Citrin, NP     REFERRING DIAG:  Diagnosis  M54.2 (ICD-10-CM) - Cervicalgia  M25.651,M25.652 (ICD-10-CM) - Joint stiffness of both hips    THERAPY DIAG:  Cervicalgia  Bilateral hip pain  Muscle weakness (generalized)  Rationale for Evaluation and Treatment:  Rehabilitation  ONSET DATE: 2023  SUBJECTIVE:   SUBJECTIVE STATEMENT: Pt states that it started with R hip/lumbar pain. Now she has lumbar, groin, and bilateral thigh pain. She also bilateral shoulder that seems related. She will sometimes have trouble getting out of a chair due to the hip pain. Pt states that walking will cause back and hip fatigue, < 20 mins. Getting in and out of the car causes stiffness and pain. Pt is not able to walk with exercise. Was not exercising prior to pain. Pt had previous PT for her back- did stretching mostly but hurts to do, no real relief. Did not do specific strengthening. Back rotations does create lower lumbar pain. Pt denies NT but does have progressive weakness into the LE over time.   Shoulder/neck pain started within the last 3 months. Pt feels like she lacks the strength into the UE. Denies specific MOI. Denies NT. Denies cancer red flags.    PERTINENT HISTORY: -L meniscus repair and scope 2014 - April 23 lumbar injections; facet  PAIN:  Are you having pain? Yes: NPRS scale: 4/10 Pain location: back, bilateral thigh, and into calves Pain description: dull, radiating Aggravating factors: sitting and then transfers, bending, squatting, reaching,  Relieving factors: meds, resting, back brace , heating  pad   Are you having pain? Yes: NPRS scale: 5/10 Pain location: bilat shoulders and neck Pain description: achey, radiating into arms Aggravating factors: lifting, constantly there  Relieving factors: heating pad  PRECAUTIONS: None  WEIGHT BEARING RESTRICTIONS: No  FALLS:  Has patient fallen in last 6 months? Yes 1, caught toe  LIVING ENVIRONMENT: Lives with: lives with their spouse Lives in: House/apartment Stairs: Yes, 2 story and 3 to enter  Has following equipment at home: None  OCCUPATION: IT/desk job- 6 hours a day   Leisure: taking care of grandchildren  <25lbs of lifting at work   PLOF: Independent  PATIENT GOALS: " more  mobile and less pain"    OBJECTIVE:   DIAGNOSTIC FINDINGS:   IMPRESSION: Degenerative changes. No acute osseous abnormalities.  Imaging that documents the presence of facet disease and excludes other causes of pain.       PATIENT SURVEYS:  FOTO 49 59 @ DC 12 pts MCII  COGNITION: Overall cognitive status: Within functional limits for tasks assessed     SENSATION: WFL   MUSCLE LENGTH: Hamstrings: WFL  POSTURE: rounded shoulders, forward head, increased thoracic kyphosis, and posterior pelvic tilt   CERVICAL ROM: WFL in all planes with recreation of pain in all directions   LOWER EXTREMITY ROM:  Active ROM Right eval Left eval  Hip flexion 120 120  Hip extension    Hip abduction 30 30  Hip adduction    Hip internal rotation 30 30  Hip external rotation 35 35   (Blank rows = not tested)  LUMBAR ROM: p! throughout  Active  A/PROM  eval  Flexion 100  Extension 75%  Right lateral flexion 75%  Left lateral flexion 75%  Right rotation 80%  Left rotation 80%   (Blank rows = not tested)   LOWER EXTREMITY MMT: below age related norms  MMT Right eval Left eval  Hip flexion 36.7 lbs 41.0  Hip extension    Hip abduction 40.0 33.9  Hip adduction    Hip internal rotation    Hip external rotation    Knee flexion    Knee extension 45.2 49.2  Ankle dorsiflexion    Ankle plantarflexion    Ankle inversion    Ankle eversion     (Blank rows = not tested)  LOWER EXTREMITY SPECIAL TESTS:  Hip special tests: Luisa Hart (FABER) test: positive , Trendelenburg test: negative, SI distraction test: negative, and Hip scouring test: negative  FUNCTIONAL TESTS:  5xSTS: 14.3s   GAIT: Distance walked: 25ft Assistive device utilized: None Level of assistance: Complete Independence Comments: WFL on level indoor surface   TODAY'S TREATMENT:                                                                                                                               DATE: 5/23   Exercises - Standing Shoulder Horizontal Abduction with Resistance  - 3 x daily - 7 x weekly -  1 sets - 10 reps - Standing Shoulder Diagonal Horizontal Abduction 60/120 Degrees with Resistance  - 3 x daily - 7 x weekly - 1 sets - 10 reps - Single Arm Doorway Pec Stretch at 90 Degrees Abduction  - 2 x daily - 7 x weekly - 1 sets - 3 reps - 30 hold - Sit to Stand with Resistance Around Legs  - 1 x daily - 7 x weekly - 2 sets - 10 reps - Jefferson Curl  - 1 x daily - 7 x weekly - 2 sets - 5 reps    PATIENT EDUCATION:  Education details: MOI, diagnosis, prognosis, anatomy, mental health, sleep hygiene, exercise progression, DOMS expectations, muscle firing,  envelope of function, HEP, POC  Person educated: Patient Education method: Explanation, Demonstration, Tactile cues, Verbal cues, and Handouts Education comprehension: verbalized understanding, returned demonstration, verbal cues required, tactile cues required, and needs further education  HOME EXERCISE PROGRAM:  Access Code: [email or web address] URL: [email or web address] Date: 01/12/2023 Prepared by: Zebedee Iba  ASSESSMENT:  CLINICAL IMPRESSION: Patient is a 67 y.o. female who was seen today for physical therapy evaluation and treatment for c/c of chronic upper quarter and LBP. Pt's s/s appear consistent with potential lumbar radiculopathy and generalized posterior neck and shoulder pain. Clinical testing does not recreate radicular pain though pt does complain of distal symptoms. Pt appears consistent with imaging showing normal aging changing in conjunction with sedentary lifestyle. Pt's pain is moderately sensitive and irritable with movement but does not increase with exercise. Pt's is more strength limited at this time. Plan to continue with general scapular and LE exercise and functional capacity at future sessions. Pt would benefit from continued skilled therapy in order to reach goals and maximize functional UE  and lumbar strength and ROM for full return to PLOF.    OBJECTIVE IMPAIRMENTS: decreased endurance, decreased mobility, difficulty walking, decreased ROM, decreased strength, hypomobility, impaired flexibility, improper body mechanics, postural dysfunction, and pain.   ACTIVITY LIMITATIONS: carrying, lifting, bending, sitting, squatting, sleeping, stairs, transfers, reach over head, and locomotion level  PARTICIPATION LIMITATIONS: cleaning, laundry, driving, shopping, community activity, occupation, and yard work  PERSONAL FACTORS: Age, Fitness, Time since onset of injury/illness/exacerbation, and 1-2 comorbidities:    are also affecting patient's functional outcome.   REHAB POTENTIAL: Good  CLINICAL DECISION MAKING: Evolving/moderate complexity  EVALUATION COMPLEXITY: Moderate    SHORT TERM GOALS: Target date: 02/23/2023  Pt will become independent with HEP in order to demonstrate synthesis of PT education.   Goal status: INITIAL  2.  Pt will report at least 2 pt reduction on NPRS scale for pain in order to demonstrate functional improvement with household activity, self care, and ADL.   Goal status: INITIAL  3.  Pt will score at least 12 pt increase on FOTO to demonstrate functional improvement in MCII and pt perceived function.     Goal status: INITIAL   LONG TERM GOALS: Target date: 04/06/2023   Pt  will become independent with final HEP in order to demonstrate synthesis of PT education.  Goal status: INITIAL  2.  Pt will score >/= 59 on FOTO to demonstrate improvement in perceived hip/lumbar function.   Goal status: INITIAL  3.  Pt will be able to demonstrate/report ability to walk >15 mins without pain in order to demonstrate functional improvement and tolerance to exercise and community mobility.   Goal status: INITIAL  4.  Pt will be able to perform 5XSTS in under 12s  in order to demonstrate  functional improvement above the cut off score for adults.   Goal  status: INITIAL    PLAN:  PT FREQUENCY: 1-2x/week  PT DURATION: 12 weeks  PLANNED INTERVENTIONS: Therapeutic exercises, Therapeutic activity, Neuromuscular re-education, Balance training, Gait training, Patient/Family education, Self Care, Joint mobilization, Joint manipulation, Stair training, Orthotic/Fit training, DME instructions, Aquatic Therapy, Dry Needling, Electrical stimulation, Spinal manipulation, Spinal mobilization, Cryotherapy, Moist heat, scar mobilization, Splintting, Taping, Vasopneumatic device, Traction, Ultrasound, Ionotophoresis 4mg /ml Dexamethasone, Manual therapy, and Re-evaluation  PLAN FOR NEXT SESSION: nustep, rowing, leg press, sidestepping, bridging   Zebedee Iba, PT 01/12/2023, 4:53 PM

## 2023-01-12 NOTE — Progress Notes (Signed)
Atorvastatin 20 mg tablet daily [facility].

## 2023-01-13 ENCOUNTER — Encounter (HOSPITAL_BASED_OUTPATIENT_CLINIC_OR_DEPARTMENT_OTHER): Payer: Self-pay | Admitting: Physical Therapy

## 2023-01-25 DIAGNOSIS — Z79899 Other long term (current) drug therapy: Secondary | ICD-10-CM | POA: Diagnosis not present

## 2023-01-25 DIAGNOSIS — F900 Attention-deficit hyperactivity disorder, predominantly inattentive type: Secondary | ICD-10-CM | POA: Diagnosis not present

## 2023-01-29 ENCOUNTER — Other Ambulatory Visit: Payer: Self-pay | Admitting: Family

## 2023-01-29 DIAGNOSIS — G43909 Migraine, unspecified, not intractable, without status migrainosus: Secondary | ICD-10-CM

## 2023-02-02 ENCOUNTER — Ambulatory Visit (HOSPITAL_BASED_OUTPATIENT_CLINIC_OR_DEPARTMENT_OTHER): Payer: BC Managed Care – PPO | Attending: Family | Admitting: Physical Therapy

## 2023-02-02 ENCOUNTER — Encounter (HOSPITAL_BASED_OUTPATIENT_CLINIC_OR_DEPARTMENT_OTHER): Payer: Self-pay | Admitting: Physical Therapy

## 2023-02-02 DIAGNOSIS — M542 Cervicalgia: Secondary | ICD-10-CM | POA: Insufficient documentation

## 2023-02-02 DIAGNOSIS — M6281 Muscle weakness (generalized): Secondary | ICD-10-CM | POA: Insufficient documentation

## 2023-02-02 DIAGNOSIS — M25552 Pain in left hip: Secondary | ICD-10-CM | POA: Diagnosis not present

## 2023-02-02 DIAGNOSIS — M25551 Pain in right hip: Secondary | ICD-10-CM | POA: Diagnosis not present

## 2023-02-02 NOTE — Therapy (Signed)
OUTPATIENT PHYSICAL THERAPY LOWER EXTREMITY TREATMENT   Patient Name: Hailey Galvan MRN: [id number] DOB:1956/01/19, 67 y.o., female Today's Date: 02/02/2023  END OF SESSION:  PT End of Session - 02/02/23 1621     Visit Number 2    Number of Visits 13    Date for PT Re-Evaluation 04/12/23    Authorization Type [employer]BS    PT Start Time 1617    PT Stop Time 1658    PT Time Calculation (min) 41 min    Activity Tolerance Patient tolerated treatment well    Behavior During Therapy WFL for tasks assessed/performed             Past Medical History:  Diagnosis Date   ADHD    Allergy    Crohn disease (HCC)    Depression .   Hiatal hernia    Ileitis    Past Surgical History:  Procedure Laterality Date   ABDOMINAL HYSTERECTOMY     BREAST EXCISIONAL BIOPSY Right    BREAST SURGERY     Tumor Removal   CESAREAN SECTION     COLONOSCOPY  last 03/30/2018   at [facility]=polyps   KNEE SURGERY     TONSILLECTOMY     Patient Active Problem List   Diagnosis Date Noted   Heel callus 05/07/2021   Skin fissures 05/07/2021   Hav (hallux abducto valgus), unspecified laterality 05/07/2021   Hammer toe, acquired 05/07/2021   Eustachian tube dysfunction, left 03/24/2021   Nasal septal deviation 03/24/2021   Perceived hearing changes 03/24/2021   ADHD    Allergy    Hiatal hernia    Crohn's disease (HCC) 04/16/2019   Breast mass, right 10/18/2018   Healthcare maintenance 09/06/2018   Ileitis 09/06/2018   Pain in both hands 08/03/2015   Neuralgia and neuritis, unspecified 12/05/2012   S/P arthroscopic knee surgery 10/01/2012   Hyperlipidemia 11/15/2011    PCP: Ngetich, Donalee Citrin, NP   REFERRING PROVIDER:  Ngetich, Donalee Citrin, NP     REFERRING DIAG:  Diagnosis  M54.2 (ICD-10-CM) - Cervicalgia  M25.651,M25.652 (ICD-10-CM) - Joint stiffness of both hips    THERAPY DIAG:  Cervicalgia  Muscle weakness (generalized)  Bilateral hip pain  Rationale for Evaluation and Treatment:  Rehabilitation  ONSET DATE: 2023  SUBJECTIVE:   SUBJECTIVE STATEMENT:  Pt states pain feels similar. Pt still feels difficulty with getting up and down stairs. Pt has been walking a little for exercise- about 30-45 mins broken up.     Eval:  Pt states that it started with R hip/lumbar pain. Now she has lumbar, groin, and bilateral thigh pain. She also bilateral shoulder that seems related. She will sometimes have trouble getting out of a chair due to the hip pain. Pt states that walking will cause back and hip fatigue, < 20 mins. Getting in and out of the car causes stiffness and pain. Pt is not able to walk with exercise. Was not exercising prior to pain. Pt had previous PT for her back- did stretching mostly but hurts to do, no real relief. Did not do specific strengthening. Back rotations does create lower lumbar pain. Pt denies NT but does have progressive weakness into the LE over time.   Shoulder/neck pain started within the last 3 months. Pt feels like she lacks the strength into the UE. Denies specific MOI. Denies NT. Denies cancer red flags.    PERTINENT HISTORY: -L meniscus repair and scope 2014 - April 23 lumbar injections; facet  PAIN:  Are you having  pain? Yes: NPRS scale: 4/10 Pain location: back, bilateral thigh, and into calves Pain description: dull, radiating Aggravating factors: sitting and then transfers, bending, squatting, reaching,  Relieving factors: meds, resting, back brace , heating pad   Are you having pain? Yes: NPRS scale: 5/10 Pain location: bilat shoulders and neck Pain description: achey, radiating into arms Aggravating factors: lifting, constantly there  Relieving factors: heating pad  PRECAUTIONS: None  WEIGHT BEARING RESTRICTIONS: No  FALLS:  Has patient fallen in last 6 months? Yes 1, caught toe  LIVING ENVIRONMENT: Lives with: lives with their spouse Lives in: House/apartment Stairs: Yes, 2 story and 3 to enter  Has following  equipment at home: None  OCCUPATION: IT/desk job- 6 hours a day   Leisure: taking care of grandchildren  <25lbs of lifting at work   PLOF: Independent  PATIENT GOALS: " more mobile and less pain"    OBJECTIVE:   DIAGNOSTIC FINDINGS:   IMPRESSION: Degenerative changes. No acute osseous abnormalities.  Imaging that documents the presence of facet disease and excludes other causes of pain.       PATIENT SURVEYS:  FOTO 49 59 @ DC 12 pts MCII   POSTURE: rounded shoulders, forward head, increased thoracic kyphosis, and posterior pelvic tilt   CERVICAL ROM: WFL in all planes with recreation of pain in all directions   LOWER EXTREMITY ROM:  Active ROM Right eval Left eval  Hip flexion 120 120  Hip extension    Hip abduction 30 30  Hip adduction    Hip internal rotation 30 30  Hip external rotation 35 35   (Blank rows = not tested)  LUMBAR ROM: p! throughout  Active  A/PROM  eval  Flexion 100  Extension 75%  Right lateral flexion 75%  Left lateral flexion 75%  Right rotation 80%  Left rotation 80%   (Blank rows = not tested)   LOWER EXTREMITY MMT: below age related norms  MMT Right eval Left eval  Hip flexion 36.7 lbs 41.0  Hip extension    Hip abduction 40.0 33.9  Hip adduction    Hip internal rotation    Hip external rotation    Knee flexion    Knee extension 45.2 49.2  Ankle dorsiflexion    Ankle plantarflexion    Ankle inversion    Ankle eversion     (Blank rows = not tested)  LOWER EXTREMITY SPECIAL TESTS:  Hip special tests: Luisa Hart (FABER) test: positive , Trendelenburg test: negative, SI distraction test: negative, and Hip scouring test: negative  FUNCTIONAL TESTS:  5xSTS: 14.3s   GAIT: Distance walked: 37ft Assistive device utilized: None Level of assistance: Complete Independence Comments: WFL on level indoor surface   TODAY'S TREATMENT:                                                                                                                               DATE:  6/13  STM: bilateral QL R>L; R  hip lateral rotators and HS  Nustep Lvl 5 5 min Jefferson curl 3x5 5lbs  Bridge with GTB ABD 10ft 2x laps Supine HS stretch 5s 10x Child's pose 5s 10x Blue TB row 3x10   5/23   Exercises - Standing Shoulder Horizontal Abduction with Resistance  - 3 x daily - 7 x weekly - 1 sets - 10 reps - Standing Shoulder Diagonal Horizontal Abduction 60/120 Degrees with Resistance  - 3 x daily - 7 x weekly - 1 sets - 10 reps - Single Arm Doorway Pec Stretch at 90 Degrees Abduction  - 2 x daily - 7 x weekly - 1 sets - 3 reps - 30 hold - Sit to Stand with Resistance Around Legs  - 1 x daily - 7 x weekly - 2 sets - 10 reps - Jefferson Curl  - 1 x daily - 7 x weekly - 2 sets - 5 reps    PATIENT EDUCATION:  Education details: anatomy, mental health, sleep hygiene, exercise progression, DOMS expectations, muscle firing,  envelope of function, HEP, POC  Person educated: Patient Education method: Explanation, Demonstration, Tactile cues, Verbal cues, and Handouts Education comprehension: verbalized understanding, returned demonstration, verbal cues required, tactile cues required, and needs further education  HOME EXERCISE PROGRAM:  Access Code: [email or web address] (prefers electronic)  URL: [email or web address] Date: 01/12/2023 Prepared by: Zebedee Iba  ASSESSMENT:  CLINICAL IMPRESSION: Pt with very good tolerance to progression of HEP at today's session. Pt did require a small amount of manual therapy due to R lateral hip pain but pt responded well to stretching at both the lumbar and posterior thigh. No increase in pain during session. Given pt report of shoulder improvement, session focused on LE largely but plan to trial cervical mobs to reduce shoulder pain and discomfort in addition to progressive mobility and strength of the LE. Pt would benefit from continued skilled therapy in order to reach goals and maximize  functional UE and lumbar strength and ROM for full return to PLOF.    OBJECTIVE IMPAIRMENTS: decreased endurance, decreased mobility, difficulty walking, decreased ROM, decreased strength, hypomobility, impaired flexibility, improper body mechanics, postural dysfunction, and pain.   ACTIVITY LIMITATIONS: carrying, lifting, bending, sitting, squatting, sleeping, stairs, transfers, reach over head, and locomotion level  PARTICIPATION LIMITATIONS: cleaning, laundry, driving, shopping, community activity, occupation, and yard work  PERSONAL FACTORS: Age, Fitness, Time since onset of injury/illness/exacerbation, and 1-2 comorbidities:    are also affecting patient's functional outcome.   REHAB POTENTIAL: Good  CLINICAL DECISION MAKING: Evolving/moderate complexity  EVALUATION COMPLEXITY: Moderate    SHORT TERM GOALS: Target date: 02/23/2023  Pt will become independent with HEP in order to demonstrate synthesis of PT education.   Goal status: INITIAL  2.  Pt will report at least 2 pt reduction on NPRS scale for pain in order to demonstrate functional improvement with household activity, self care, and ADL.   Goal status: INITIAL  3.  Pt will score at least 12 pt increase on FOTO to demonstrate functional improvement in MCII and pt perceived function.     Goal status: INITIAL   LONG TERM GOALS: Target date: 04/06/2023   Pt  will become independent with final HEP in order to demonstrate synthesis of PT education.  Goal status: INITIAL  2.  Pt will score >/= 59 on FOTO to demonstrate improvement in perceived hip/lumbar function.   Goal status: INITIAL  3.  Pt will be able to demonstrate/report ability to walk >15 mins without pain in order to  demonstrate functional improvement and tolerance to exercise and community mobility.   Goal status: INITIAL  4.  Pt will be able to perform 5XSTS in under 12s  in order to demonstrate functional improvement above the cut off score for  adults.   Goal status: INITIAL    PLAN:  PT FREQUENCY: 1-2x/week  PT DURATION: 12 weeks  PLANNED INTERVENTIONS: Therapeutic exercises, Therapeutic activity, Neuromuscular re-education, Balance training, Gait training, Patient/Family education, Self Care, Joint mobilization, Joint manipulation, Stair training, Orthotic/Fit training, DME instructions, Aquatic Therapy, Dry Needling, Electrical stimulation, Spinal manipulation, Spinal mobilization, Cryotherapy, Moist heat, scar mobilization, Splintting, Taping, Vasopneumatic device, Traction, Ultrasound, Ionotophoresis 4mg /ml Dexamethasone, Manual therapy, and Re-evaluation  PLAN FOR NEXT SESSION: nustep, cervical mobs/traction, RDL, adductor lunge stretch   Zebedee Iba, PT 02/02/2023, 5:13 PM

## 2023-02-09 ENCOUNTER — Ambulatory Visit (HOSPITAL_BASED_OUTPATIENT_CLINIC_OR_DEPARTMENT_OTHER): Payer: BC Managed Care – PPO | Admitting: Physical Therapy

## 2023-02-16 ENCOUNTER — Encounter (HOSPITAL_BASED_OUTPATIENT_CLINIC_OR_DEPARTMENT_OTHER): Payer: BC Managed Care – PPO | Admitting: Physical Therapy

## 2023-02-22 ENCOUNTER — Encounter (HOSPITAL_BASED_OUTPATIENT_CLINIC_OR_DEPARTMENT_OTHER): Payer: BC Managed Care – PPO | Admitting: Physical Therapy

## 2023-02-27 DIAGNOSIS — M533 Sacrococcygeal disorders, not elsewhere classified: Secondary | ICD-10-CM | POA: Diagnosis not present

## 2023-03-02 ENCOUNTER — Encounter (HOSPITAL_BASED_OUTPATIENT_CLINIC_OR_DEPARTMENT_OTHER): Payer: BC Managed Care – PPO | Admitting: Physical Therapy

## 2023-03-02 ENCOUNTER — Encounter (HOSPITAL_BASED_OUTPATIENT_CLINIC_OR_DEPARTMENT_OTHER): Payer: Self-pay

## 2023-03-09 ENCOUNTER — Encounter (HOSPITAL_BASED_OUTPATIENT_CLINIC_OR_DEPARTMENT_OTHER): Payer: BC Managed Care – PPO | Admitting: Physical Therapy

## 2023-03-13 ENCOUNTER — Other Ambulatory Visit: Payer: Self-pay | Admitting: *Deleted

## 2023-03-13 DIAGNOSIS — G43909 Migraine, unspecified, not intractable, without status migrainosus: Secondary | ICD-10-CM

## 2023-03-13 MED ORDER — BUTALBITAL-APAP-CAFFEINE 50-325-40 MG PO TABS
1.0000 | ORAL_TABLET | Freq: Four times a day (QID) | ORAL | 0 refills | Status: AC | PRN
Start: 2023-03-13 — End: ?

## 2023-03-13 NOTE — Telephone Encounter (Signed)
Please schedule follow up appointment in September,2024.

## 2023-03-13 NOTE — Telephone Encounter (Signed)
Patient requested refill.  Epic LR: 01/30/2023  Pended Rx and sent to [facility] for approval.

## 2023-03-14 ENCOUNTER — Encounter: Payer: Self-pay | Admitting: *Deleted

## 2023-03-20 DIAGNOSIS — M533 Sacrococcygeal disorders, not elsewhere classified: Secondary | ICD-10-CM | POA: Diagnosis not present

## 2023-04-20 DIAGNOSIS — M545 Low back pain, unspecified: Secondary | ICD-10-CM | POA: Diagnosis not present

## 2023-04-20 DIAGNOSIS — Z79891 Long term (current) use of opiate analgesic: Secondary | ICD-10-CM | POA: Diagnosis not present

## 2023-05-18 DIAGNOSIS — Z79891 Long term (current) use of opiate analgesic: Secondary | ICD-10-CM | POA: Diagnosis not present

## 2023-05-18 DIAGNOSIS — M47816 Spondylosis without myelopathy or radiculopathy, lumbar region: Secondary | ICD-10-CM | POA: Diagnosis not present

## 2023-05-23 ENCOUNTER — Other Ambulatory Visit: Payer: Self-pay | Admitting: Family

## 2023-05-23 DIAGNOSIS — G43909 Migraine, unspecified, not intractable, without status migrainosus: Secondary | ICD-10-CM

## 2023-05-23 NOTE — Telephone Encounter (Signed)
Patient is requesting a refill of the following medications: Requested Prescriptions   Pending Prescriptions Disp Refills   butalbital-acetaminophen-caffeine (FIORICET) 50-325-40 MG tablet [Pharmacy Med Name: Butalbital-APAP-Caffeine 50-325-40 MG Oral Tablet] 30 tablet 0    Sig: TAKE 1 TABLET BY MOUTH EVERY 6 HOURS AS NEEDED FOR HEADACHE    Date of last refill:03/13/2023  Refill amount: 0  Treatment agreement date: 03/30/2021

## 2023-05-30 ENCOUNTER — Ambulatory Visit: Payer: BC Managed Care – PPO | Admitting: Family

## 2023-05-30 VITALS — BP 128/80 | HR 76 | Temp 97.9°F | Resp 16 | Ht 65.0 in | Wt 148.0 lb

## 2023-05-30 DIAGNOSIS — L602 Onychogryphosis: Secondary | ICD-10-CM

## 2023-05-30 MED ORDER — DOXYCYCLINE HYCLATE 100 MG PO TABS
100.0000 mg | ORAL_TABLET | Freq: Two times a day (BID) | ORAL | 0 refills | Status: AC
Start: 2023-05-30 — End: 2023-06-06

## 2023-05-30 NOTE — Patient Instructions (Signed)
Ingrown Toenail  An ingrown toenail occurs when the corner or sides of a toenail grow into the surrounding skin. This causes discomfort and pain. The big toe is most commonly affected, but any of the toes can be affected. If an ingrown toenail is not treated, it can become infected. What are the causes? This condition may be caused by: Wearing shoes that are too small or tight. An injury, such as stubbing your toe or having your toe stepped on. Improper cutting or care of your toenails. Having nail or foot abnormalities that were present from birth (congenital abnormalities), such as having a nail that is too big for your toe. What increases the risk? The following factors may make you more likely to develop ingrown toenails: Age. Nails tend to get thicker with age, so ingrown nails are more common among older people. Cutting your toenails incorrectly, such as cutting them very short or cutting them unevenly. An ingrown toenail is more likely to get infected if you have: Diabetes. Blood flow (circulation) problems. What are the signs or symptoms? Symptoms of an ingrown toenail may include: Pain, soreness, or tenderness. Redness. Swelling. Hardening of the skin that surrounds the toenail. Signs that an ingrown toenail may be infected include: Fluid or pus. Symptoms that get worse. How is this diagnosed? Ingrown toenails may be diagnosed based on: Your symptoms and medical history. A physical exam. Labs or tests. If you have fluid or blood coming from your toenail, a sample may be collected to test for the specific type of bacteria that is causing the infection. How is this treated? Treatment depends on the severity of your symptoms. You may be able to care for your toenail at home. If you have an infection, you may be prescribed antibiotic medicines. If you have fluid or pus draining from your toenail, your health care provider may drain it. If you have trouble walking, you may be  given crutches to use. If you have a severe or infected ingrown toenail, you may need a procedure to remove part or all of the nail. Follow these instructions at home: Foot care  Check your wound every day for signs of infection, or as often as told by your health care provider. Check for: More redness, swelling, or pain. More fluid or blood. Warmth. Pus or a bad smell. Do not pick at your toenail or try to remove it yourself. Soak your foot in warm, soapy water. Do this for 20 minutes, 3 times a day, or as often as told by your health care provider. This helps to keep your toe clean and your skin soft. Wear shoes that fit well and are not too tight. Your health care provider may recommend that you wear open-toed shoes while you heal. Trim your toenails regularly and carefully. Cut your toenails straight across to prevent injury to the skin at the corners of the toenail. Do not cut your nails in a curved shape. Keep your feet clean and dry to help prevent infection. General instructions Take over-the-counter and prescription medicines only as told by your health care provider. If you were prescribed an antibiotic, take it as told by your health care provider. Do not stop taking the antibiotic even if you start to feel better. If your health care provider told you to use crutches to help you move around, use them as instructed. Return to your normal activities as told by your health care provider. Ask your health care provider what activities are safe for you.  Keep all follow-up visits. This is important. Contact a health care provider if: You have more redness, swelling, pain, or other symptoms that do not improve with treatment. You have fluid, blood, or pus coming from your toenail. You have a red streak on your skin that starts at your foot and spreads up your leg. You have a fever. Summary An ingrown toenail occurs when the corner or sides of a toenail grow into the surrounding skin.  This causes discomfort and pain. The big toe is most commonly affected, but any of the toes can be affected. If an ingrown toenail is not treated, it can become infected. Fluid or pus draining from your toenail is a sign of infection. Your health care provider may need to drain it. You may be given antibiotics to treat the infection. Trimming your toenails regularly and properly can help you prevent an ingrown toenail. This information is not intended to replace advice given to you by your health care provider. Make sure you discuss any questions you have with your health care provider. Document Revised: 12/08/2020 Document Reviewed: 12/08/2020 Elsevier Patient Education  2024 [employer].

## 2023-05-30 NOTE — Progress Notes (Signed)
Provider: Richarda Blade FNP-C  Hailey Galvan, Hailey Citrin, NP  Patient Care Team: Junah Yam, Hailey Citrin, NP as PCP - General (Family Medicine) Johny Drilling, MD as Consulting Physician (Anesthesiology) Minus Breeding, MD as Referring Physician (Student) Armbruster, Willaim Rayas, MD as Consulting Physician (Gastroenterology)  Extended Emergency Contact Information Primary Emergency Contact: [facility] Address: [address]          [place], [place] [address] D[place] of [place] Home Phone: [phone] Mobile Phone: [phone] Relation: Spouse  Code Status:  Full Code  Goals of care: Advanced Directive information    05/30/2023    3:42 PM  Advanced Directives  Does Patient Have a Medical Advance Directive? No     Chief Complaint  Patient presents with   Acute Visit    Ingrown toenail    HPI:  Pt is a 67 y.o. female seen today for an acute visit for evaluation of right great toe ingrown toenail.states side of the toenail was bleeding.Has been soaking with Epsom salt and cleaning with Hydrogen peroxide. She denies any fever or chills.states toe very tender to touch.  Past Medical History:  Diagnosis Date   ADHD    Allergy    Crohn disease (HCC)    Depression .   Hiatal hernia    Ileitis    Past Surgical History:  Procedure Laterality Date   ABDOMINAL HYSTERECTOMY     BREAST EXCISIONAL BIOPSY Right    BREAST SURGERY     Tumor Removal   CESAREAN SECTION     COLONOSCOPY  last 03/30/2018   at [facility]=polyps   KNEE SURGERY     TONSILLECTOMY      Allergies  Allergen Reactions   Etodolac Other (See Comments)    Sleepiness, feeling "out of it" Sleepiness, feeling "out of it"    Tizanidine Hcl Other (See Comments)    Hallucinations.   Naproxen Other (See Comments)    "flu like symptoms"   Prednisone Other (See Comments)    Outpatient Encounter Medications as of 05/30/2023  Medication Sig   ammonium lactate (LAC-HYDRIN) 12 % lotion Apply 1 application  topically as needed for dry skin.   atorvastatin (LIPITOR) 20 MG tablet Take 1 tablet (20 mg total) by mouth daily.   BUPROPION HCL PO Take 150 mg by mouth daily.   butalbital-acetaminophen-caffeine (FIORICET) 50-325-40 MG tablet TAKE 1 TABLET BY MOUTH EVERY 6 HOURS AS NEEDED FOR HEADACHE   chlorpheniramine-HYDROcodone (TUSSIONEX) 10-8 MG/5ML TAKE 5 mls BY MOUTH EVERY TWELVE HOURS AS NEEDED FOR cough   valACYclovir (VALTREX) 1000 MG tablet Take 1 tablet (1,000 mg total) by mouth 2 (two) times daily as needed.   azelastine (ASTELIN) 0.1 % nasal spray Place 2 sprays into both nostrils 2 (two) times daily. Use in each nostril as directed   gabapentin (NEURONTIN) 300 MG capsule Take 1 capsule (300 mg total) by mouth 2 (two) times daily. (Patient not taking: Reported on 01/12/2023)   [DISCONTINUED] Hyoscyamine Sulfate SL (LEVSIN/SL) 0.125 MG SUBL Place 1 tablet under the tongue every 6 (six) hours as needed.   No facility-administered encounter medications on file as of 05/30/2023.    Review of Systems  Constitutional:  Negative for appetite change, chills, fatigue, fever and unexpected weight change.  Eyes:  Negative for pain, discharge, redness, itching and visual disturbance.  Musculoskeletal:  Negative for arthralgias, back pain, gait problem and joint swelling.  Skin:  Negative for color change, pallor and wound.  Neurological:  Negative for dizziness, weakness, light-headedness, numbness and headaches.  Immunization History  Administered Date(s) Administered   Influenza, High Dose Seasonal PF 05/09/2023   Influenza,inj,Quad PF,6+ Mos 05/29/2018   Influenza-Unspecified 06/23/2011, 06/20/2012, 06/08/2015, 05/22/2016, 07/12/2019, 07/23/2020   PFIZER(Purple Top)SARS-COV-2 Vaccination 11/07/2019, 11/28/2019, 08/02/2020, 03/10/2021   Pneumococcal Polysaccharide-23 11/16/2016, 03/10/2021   Tdap 02/19/2006, 11/16/2016   Zoster Recombinant(Shingrix) 08/30/2017, 07/09/2019   Pertinent  Health  Maintenance Due  Topic Date Due   DEXA SCAN  Never done   MAMMOGRAM  06/16/2024   Colonoscopy  08/18/2029   INFLUENZA VACCINE  Completed      11/29/2021    2:25 PM 05/26/2022    8:36 AM 08/11/2022    9:22 AM 09/30/2022    2:36 PM 05/30/2023    3:42 PM  Fall Risk  Falls in the past year? 0 0 1 0 0  Was there an injury with Fall? 0 0 1 0   Was there an injury with Fall? - Comments   Broke big toe    Fall Risk Category Calculator 0 0 2 0   Fall Risk Category (Retired) Low Low Moderate    (RETIRED) Patient Fall Risk Level Low fall risk Low fall risk Moderate fall risk    Patient at Risk for Falls Due to No Fall Risks No Fall Risks History of fall(s) History of fall(s)   Fall risk Follow up Falls evaluation completed  Falls evaluation completed Falls evaluation completed    Functional Status Survey:    Vitals:   05/30/23 1543  BP: 128/80  Pulse: 76  Resp: 16  Temp: 97.9 F (36.6 C)  SpO2: 97%  Weight: 148 lb (67.1 kg)  Height: 5\' 5"  (1.651 m)   Body mass index is 24.63 kg/m. Physical Exam Vitals reviewed.  Constitutional:      General: She is not in acute distress.    Appearance: Normal appearance. She is normal weight. She is not ill-appearing or diaphoretic.  HENT:     Head: Normocephalic.  Cardiovascular:     Rate and Rhythm: Normal rate and regular rhythm.     Pulses: Normal pulses.     Heart sounds: Normal heart sounds. No murmur heard.    No friction rub. No gallop.  Pulmonary:     Effort: Pulmonary effort is normal. No respiratory distress.     Breath sounds: Normal breath sounds. No wheezing, rhonchi or rales.  Chest:     Chest wall: No tenderness.  Musculoskeletal:        General: No swelling or tenderness. Normal range of motion.     Right lower leg: No edema.     Left lower leg: No edema.  Feet:     Right foot:     Skin integrity: Skin integrity normal.     Toenail Condition: Right toenails are ingrown.     Left foot:     Skin integrity: Skin  integrity normal.     Comments: Right great lateral toenail slight erythema and tender to touch. Skin:    General: Skin is warm and dry.     Coloration: Skin is not pale.     Findings: No erythema or rash.  Neurological:     Mental Status: She is alert and oriented to person, place, and time.     Cranial Nerves: No cranial nerve deficit.     Sensory: No sensory deficit.     Motor: No weakness.     Coordination: Coordination normal.     Gait: Gait normal.  Psychiatric:  Mood and Affect: Mood normal.        Speech: Speech normal.        Behavior: Behavior normal.        Thought Content: Thought content normal.        Judgment: Judgment normal.     Labs reviewed: Recent Labs    08/05/22 0000  NA 137  K 4.8  CL 99  CO2 25*  BUN 9  CREATININE 0.8  CALCIUM 9.6   Recent Labs    08/05/22 0000  AST 21  ALT 20  ALKPHOS 126*  ALBUMIN 4.8   No results for input(s): "WBC", "NEUTROABS", "HGB", "HCT", "MCV", "PLT" in the last 8760 hours. Lab Results  Component Value Date   TSH 2.16 10/12/2022   Lab Results  Component Value Date   HGBA1C 5.5 04/24/2019   Lab Results  Component Value Date   CHOL 233 (H) 10/12/2022   HDL 84 10/12/2022   LDLCALC 117 (H) 10/12/2022   TRIG 205 (H) 10/12/2022   CHOLHDL 2.8 10/12/2022    Significant Diagnostic Results in last 30 days:  No results found.  Assessment/Plan   Overgrown toenails Afebrile  Right great lateral toenail slight erythema and tender to touch.start on doxycycline as below.SE discussed. - will referral to podiatrist to evaluate toenail.  - doxycycline (VIBRA-TABS) 100 MG tablet; Take 1 tablet (100 mg total) by mouth 2 (two) times daily for 7 days.  Dispense: 14 tablet; Refill: 0 - Ambulatory referral to Podiatry  Family/ staff Communication: Reviewed plan of care with patient verbalized understanding   Labs/tests ordered: None   Next Appointment: Return if symptoms worsen or fail to improve.   Caesar Bookman, NP

## 2023-06-05 ENCOUNTER — Encounter: Payer: Self-pay | Admitting: Podiatry

## 2023-06-05 ENCOUNTER — Ambulatory Visit (INDEPENDENT_AMBULATORY_CARE_PROVIDER_SITE_OTHER): Payer: BC Managed Care – PPO | Admitting: Podiatry

## 2023-06-05 DIAGNOSIS — L03031 Cellulitis of right toe: Secondary | ICD-10-CM | POA: Insufficient documentation

## 2023-06-05 NOTE — Progress Notes (Signed)
This patient presents to the office stating she had a painful ingrown toenail which was treated by her PCP with antibiotics and local wound care 2 weeks ago.  She says she has no pain redness or swelling at infectious site.  Ingrown toenail is now healing.  She presents to the office for definitive evaluation and treatment.  Vascular  Dorsalis pedis and posterior tibial pulses are palpable  B/L.  Capillary return  WNL.  Temperature gradient is  WNL.  Skin turgor  WNL  Sensorium  Senn Weinstein monofilament wire  WNL. Normal tactile sensation.  Nail Exam  Patient has normal nails with no evidence of bacterial or fungal infection. Healing paronychia medial border right hallux.  Orthopedic  Exam  Muscle tone and muscle strength  WNL.  No limitations of motion feet  B/L.  No crepitus or joint effusion noted.  Foot type is unremarkable and digits show no abnormalities.  Bony prominences are unremarkable.  Skin  No open lesions.  Normal skin texture and turgor  Healing paronychia right hallux.  ROV.  Discussed this condition with this patient.  We could let it declare itself or remove the medial aspect of nail and cauterize the nail bed.  She opted to allow the nail to continue to heal and will call this office for permanent correction if this problem persists.  Helane Gunther DPM

## 2023-06-07 DIAGNOSIS — M47816 Spondylosis without myelopathy or radiculopathy, lumbar region: Secondary | ICD-10-CM | POA: Diagnosis not present

## 2023-06-19 ENCOUNTER — Other Ambulatory Visit: Payer: Self-pay | Admitting: Family

## 2023-06-19 DIAGNOSIS — E782 Mixed hyperlipidemia: Secondary | ICD-10-CM

## 2023-06-30 ENCOUNTER — Other Ambulatory Visit: Payer: Self-pay | Admitting: Nurse Practitioner

## 2023-06-30 DIAGNOSIS — M47816 Spondylosis without myelopathy or radiculopathy, lumbar region: Secondary | ICD-10-CM

## 2023-07-11 ENCOUNTER — Encounter: Payer: Self-pay | Admitting: Nurse Practitioner

## 2023-07-13 DIAGNOSIS — L720 Epidermal cyst: Secondary | ICD-10-CM | POA: Diagnosis not present

## 2023-07-13 DIAGNOSIS — L570 Actinic keratosis: Secondary | ICD-10-CM | POA: Diagnosis not present

## 2023-07-17 ENCOUNTER — Ambulatory Visit
Admission: RE | Admit: 2023-07-17 | Discharge: 2023-07-17 | Disposition: A | Discharge status: Home / Self Care | Payer: BC Managed Care – PPO | Source: Ambulatory Visit | Attending: Nurse Practitioner | Admitting: Nurse Practitioner

## 2023-07-17 DIAGNOSIS — M47816 Spondylosis without myelopathy or radiculopathy, lumbar region: Secondary | ICD-10-CM

## 2023-07-17 DIAGNOSIS — M5416 Radiculopathy, lumbar region: Secondary | ICD-10-CM | POA: Diagnosis not present

## 2023-07-31 DIAGNOSIS — F900 Attention-deficit hyperactivity disorder, predominantly inattentive type: Secondary | ICD-10-CM | POA: Diagnosis not present

## 2023-07-31 DIAGNOSIS — Z79899 Other long term (current) drug therapy: Secondary | ICD-10-CM | POA: Diagnosis not present

## 2023-08-03 ENCOUNTER — Other Ambulatory Visit: Payer: Self-pay | Admitting: Family

## 2023-08-03 DIAGNOSIS — R051 Acute cough: Secondary | ICD-10-CM

## 2023-08-03 DIAGNOSIS — G43909 Migraine, unspecified, not intractable, without status migrainosus: Secondary | ICD-10-CM

## 2023-08-03 NOTE — Telephone Encounter (Signed)
Patient is requesting a refill of the following medications: Requested Prescriptions   Pending Prescriptions Disp Refills   butalbital-acetaminophen-caffeine (FIORICET) 50-325-40 MG tablet [Pharmacy Med Name: Butalbital-APAP-Caffeine 50-325-40 MG Oral Tablet] 30 tablet 0    Sig: TAKE 1 TABLET BY MOUTH EVERY 6 HOURS AS NEEDED FOR HEADACHE    Date of last refill:05/23/2023  Refill amount: 30 tablets 0 refills

## 2023-08-03 NOTE — Telephone Encounter (Signed)
Request was sent to the provider

## 2023-08-08 DIAGNOSIS — F902 Attention-deficit hyperactivity disorder, combined type: Secondary | ICD-10-CM | POA: Diagnosis not present

## 2023-09-18 ENCOUNTER — Other Ambulatory Visit: Payer: Self-pay | Admitting: Family

## 2023-09-18 DIAGNOSIS — E782 Mixed hyperlipidemia: Secondary | ICD-10-CM

## 2023-09-20 DIAGNOSIS — M4727 Other spondylosis with radiculopathy, lumbosacral region: Secondary | ICD-10-CM | POA: Diagnosis not present

## 2023-10-06 ENCOUNTER — Other Ambulatory Visit: Payer: Self-pay | Admitting: Family

## 2023-10-06 DIAGNOSIS — G43909 Migraine, unspecified, not intractable, without status migrainosus: Secondary | ICD-10-CM

## 2023-10-06 NOTE — Telephone Encounter (Signed)
Patient is requesting a refill of the following medications: Requested Prescriptions   Pending Prescriptions Disp Refills   butalbital-acetaminophen-caffeine (FIORICET) 50-325-40 MG tablet [Pharmacy Med Name: Butalbital-APAP-Caffeine 50-325-40 MG Oral Tablet] 30 tablet 0    Sig: TAKE 1 TABLET BY MOUTH EVERY 6 HOURS AS NEEDED FOR HEADACHE    Date of last refill: 08/15/2023  Refill amount: 30/0  Treatment agreement date: 03/30/2021

## 2023-10-24 DIAGNOSIS — M4727 Other spondylosis with radiculopathy, lumbosacral region: Secondary | ICD-10-CM | POA: Diagnosis not present

## 2023-10-24 DIAGNOSIS — Z79891 Long term (current) use of opiate analgesic: Secondary | ICD-10-CM | POA: Diagnosis not present

## 2023-10-25 ENCOUNTER — Other Ambulatory Visit: Payer: Self-pay | Admitting: Family

## 2023-10-26 NOTE — Telephone Encounter (Signed)
 Warning label unable to refill.

## 2023-10-27 NOTE — Telephone Encounter (Signed)
 Please schedule follow up routine appointment in April,2025

## 2023-10-30 DIAGNOSIS — M5416 Radiculopathy, lumbar region: Secondary | ICD-10-CM | POA: Diagnosis not present

## 2023-11-01 NOTE — Telephone Encounter (Signed)
 Noted.

## 2023-11-23 DIAGNOSIS — Z79891 Long term (current) use of opiate analgesic: Secondary | ICD-10-CM | POA: Diagnosis not present

## 2023-11-23 DIAGNOSIS — M4727 Other spondylosis with radiculopathy, lumbosacral region: Secondary | ICD-10-CM | POA: Diagnosis not present

## 2023-12-05 ENCOUNTER — Ambulatory Visit: Admitting: Family

## 2023-12-19 ENCOUNTER — Encounter: Payer: Self-pay | Admitting: Family

## 2023-12-19 ENCOUNTER — Ambulatory Visit: Admitting: Family

## 2023-12-19 VITALS — BP 124/72 | HR 60 | Temp 97.7°F | Resp 20 | Ht 65.0 in | Wt 144.4 lb

## 2023-12-19 DIAGNOSIS — B001 Herpesviral vesicular dermatitis: Secondary | ICD-10-CM | POA: Diagnosis not present

## 2023-12-19 DIAGNOSIS — K5000 Crohn's disease of small intestine without complications: Secondary | ICD-10-CM

## 2023-12-19 DIAGNOSIS — F909 Attention-deficit hyperactivity disorder, unspecified type: Secondary | ICD-10-CM

## 2023-12-19 DIAGNOSIS — E2839 Other primary ovarian failure: Secondary | ICD-10-CM

## 2023-12-19 DIAGNOSIS — E049 Nontoxic goiter, unspecified: Secondary | ICD-10-CM | POA: Insufficient documentation

## 2023-12-19 DIAGNOSIS — E782 Mixed hyperlipidemia: Secondary | ICD-10-CM

## 2023-12-19 DIAGNOSIS — G43909 Migraine, unspecified, not intractable, without status migrainosus: Secondary | ICD-10-CM | POA: Insufficient documentation

## 2023-12-19 DIAGNOSIS — M48061 Spinal stenosis, lumbar region without neurogenic claudication: Secondary | ICD-10-CM | POA: Insufficient documentation

## 2023-12-19 MED ORDER — ATORVASTATIN CALCIUM 20 MG PO TABS: 20.0000 mg | ORAL_TABLET | Freq: Every day | ORAL | 1 refills | Status: AC

## 2023-12-19 MED ORDER — VALACYCLOVIR HCL 1 G PO TABS: 1000.0000 mg | ORAL_TABLET | Freq: Two times a day (BID) | ORAL | 3 refills | Status: AC | PRN

## 2023-12-19 NOTE — Progress Notes (Signed)
 Provider: Christean Courts FNP-C   Tamyah Cutbirth, Elijio Guadeloupe, NP  Patient Care Team: Karna Abed, Elijio Guadeloupe, NP as PCP - General (Family Medicine) Willa Haring, MD as Consulting Physician (Anesthesiology) Aurther Blue, MD as Referring Physician (Student) Armbruster, Lendon Queen, MD as Consulting Physician (Gastroenterology)  Extended Emergency Contact Information Primary Emergency Contact: [facility] Address: [address]          [place], [place] [address] [place] Home Phone: [phone] Mobile Phone: [phone] Relation: Spouse  Code Status:  Full Code  Goals of care: Advanced Directive information    12/19/2023   11:39 AM  Advanced Directives  Does Patient Have a Medical Advance Directive? Yes  Type of Estate agent of [place];Living will  Does patient want to make changes to medical advance directive? No - Patient declined  Copy of Healthcare Power of Attorney in Chart? No - copy requested     Chief Complaint  Patient presents with   Follow-up      Discussed the use of AI scribe software for clinical note transcription with the patient, who gave verbal consent to proceed.  History of Present Illness   Hailey Galvan is a 68 year old female who presents for a six-month follow-up visit.  She has been on atorvastatin  but has been receiving only 10 mg doses instead of the prescribed 20 mg. She is unsure of its effectiveness. Her last blood work a year ago showed total cholesterol at 233 mg/dL, triglycerides at 884 mg/dL, and LDL at 166 mg/dL. She notes that her triglycerides increase with wine consumption.  She experiences ongoing lumbar spine issues, including tendonitis, bursitis, and arthritis. An MRI in November 2024 showed mild degenerative changes and a disc bulge at L4-L5 with mild right foramen stenosis. She receives steroid injections every three months, which provide temporary relief, but sometimes requires a heating pad and  brace for persistent pain. The pain does not radiate to her legs.  Her current medications include Wellbutrin  150 mg daily, Valtrex  for cold sores, and Celebrex 100 mg as needed. She also uses ammonium lactate  lotion for her feet and occasionally Desenex cough medication. She takes vitamin D  and B12 supplements.  She denies any current symptoms of Crohn's disease, which was previously diagnosed but attributed to a past hernia. No history of diabetes, though her father had it. No issues with anxiety or depression, and her blood pressure is typically stable.  She mentions a past embolism or blood vessel issue in her left eye, which resolved. She needs a vision check as her prescription has changed over time. She experiences allergies but manages them with azelastine  nasal spray and occasionally uses over-the-counter antihistamines like Claritin  or Xyzal.   Past Medical History:  Diagnosis Date   ADHD    Allergy    Crohn disease (HCC)    Depression .   Hiatal hernia    Ileitis    Past Surgical History:  Procedure Laterality Date   ABDOMINAL HYSTERECTOMY     BREAST EXCISIONAL BIOPSY Right    BREAST SURGERY     Tumor Removal   CESAREAN SECTION     COLONOSCOPY  last 03/30/2018   at [facility]=polyps   KNEE SURGERY     TONSILLECTOMY      Allergies  Allergen Reactions   Etodolac Other (See Comments)    Sleepiness, feeling "out of it" Sleepiness, feeling "out of it"    Tizanidine Hcl Other (See Comments)    Hallucinations.   Naproxen Other (See  Comments)    "flu like symptoms"   Prednisone  Other (See Comments)    Allergies as of 12/19/2023       Reactions   Etodolac Other (See Comments)   Sleepiness, feeling "out of it" Sleepiness, feeling "out of it"   Tizanidine Hcl Other (See Comments)   Hallucinations.   Naproxen Other (See Comments)   "flu like symptoms"   Prednisone  Other (See Comments)        Medication List        Accurate as of December 19, 2023 12:40 PM. If you have  any questions, ask your nurse or doctor.          ammonium lactate  12 % lotion Commonly known as: LAC-HYDRIN  Apply 1 application topically as needed for dry skin.   atorvastatin  20 MG tablet Commonly known as: LIPITOR TAKE 1 TABLET DAILY (NEED APPOINTMENT)   atorvastatin  10 MG tablet Commonly known as: LIPITOR TAKE 1 TABLET DAILY AT 6 P.M.   BUPROPION  HCL PO Take 150 mg by mouth daily.   butalbital -acetaminophen -caffeine  50-325-40 MG tablet Commonly known as: FIORICET TAKE 1 TABLET BY MOUTH EVERY 6 HOURS AS NEEDED FOR HEADACHE   celecoxib 100 MG capsule Commonly known as: CELEBREX Take 100 mg by mouth as needed.   chlorpheniramine-HYDROcodone  10-8 MG/5ML Commonly known as: TUSSIONEX TAKE 5 mls BY MOUTH EVERY TWELVE HOURS AS NEEDED FOR cough   valACYclovir  1000 MG tablet Commonly known as: VALTREX  Take 1 tablet (1,000 mg total) by mouth 2 (two) times daily as needed.        Review of Systems  Constitutional:  Negative for appetite change, chills, fatigue, fever and unexpected weight change.  HENT:  Negative for congestion, dental problem, ear discharge, ear pain, facial swelling, hearing loss, nosebleeds, postnasal drip, rhinorrhea, sinus pressure, sinus pain, sneezing, sore throat, tinnitus and trouble swallowing.   Eyes:  Negative for pain, discharge, redness, itching and visual disturbance.  Respiratory:  Negative for cough, chest tightness, shortness of breath and wheezing.   Cardiovascular:  Negative for chest pain, palpitations and leg swelling.  Gastrointestinal:  Negative for abdominal distention, abdominal pain, blood in stool, constipation, diarrhea, nausea and vomiting.  Endocrine: Negative for cold intolerance, heat intolerance, polydipsia, polyphagia and polyuria.  Genitourinary:  Negative for difficulty urinating, dysuria, flank pain, frequency and urgency.  Musculoskeletal:  Positive for back pain. Negative for arthralgias, gait problem, joint swelling,  myalgias, neck pain and neck stiffness.  Skin:  Negative for color change, pallor, rash and wound.  Neurological:  Negative for dizziness, syncope, speech difficulty, weakness, light-headedness, numbness and headaches.  Hematological:  Does not bruise/bleed easily.  Psychiatric/Behavioral:  Negative for agitation, behavioral problems, confusion, hallucinations, self-injury, sleep disturbance and suicidal ideas. The patient is not nervous/anxious.     Immunization History  Administered Date(s) Administered   Influenza, High Dose Seasonal PF 05/09/2023   Influenza,inj,Quad PF,6+ Mos 05/29/2018   Influenza-Unspecified 06/23/2011, 06/20/2012, 06/08/2015, 05/22/2016, 07/12/2019, 07/23/2020   PFIZER(Purple Top)SARS-COV-2 Vaccination 11/07/2019, 11/28/2019, 08/02/2020, 03/10/2021   Pneumococcal Polysaccharide-23 11/16/2016, 03/10/2021   Tdap 02/19/2006, 11/16/2016   Zoster Recombinant(Shingrix) 08/30/2017, 07/09/2019   Pertinent  Health Maintenance Due  Topic Date Due   DEXA SCAN  Never done   INFLUENZA VACCINE  03/22/2024   MAMMOGRAM  06/16/2024   Colonoscopy  08/18/2029      05/26/2022    8:36 AM 08/11/2022    9:22 AM 09/30/2022    2:36 PM 05/30/2023    3:42 PM 12/19/2023   11:39 AM  Fall  Risk  Falls in the past year? 0 1 0 0 0  Was there an injury with Fall? 0 1 0  0  Was there an injury with Fall? - Comments  Broke big toe     Fall Risk Category Calculator 0 2 0  0  Fall Risk Category (Retired) Low Moderate     (RETIRED) Patient Fall Risk Level Low fall risk Moderate fall risk     Patient at Risk for Falls Due to No Fall Risks History of fall(s) History of fall(s)  No Fall Risks  Fall risk Follow up  Falls evaluation completed Falls evaluation completed  Falls evaluation completed   Functional Status Survey:    Vitals:   12/19/23 1144  BP: 124/72  Pulse: 60  Resp: 20  Temp: 97.7 F (36.5 C)  SpO2: 97%  Weight: 144 lb 6.4 oz (65.5 kg)  Height: 5\' 5"  (1.651 m)   Body mass  index is 24.03 kg/m. Physical Exam  VITALS: BP- 124/72 MEASUREMENTS: Weight- 144.4. GENERAL: Alert, cooperative, well developed, no acute distress. HEENT: Normocephalic, normal oropharynx, moist mucous membranes, no redness in eyes, ears normal, nose normal. NECK: Neck normal. CHEST: Clear to auscultation bilaterally, no wheezes, rhonchi, or crackles. CARDIOVASCULAR: Normal heart rate and rhythm, S1 and S2 normal without murmurs. ABDOMEN: Soft, non-tender, non-distended, without organomegaly, normal bowel sounds. EXTREMITIES: No cyanosis or edema, extremities normal. NEUROLOGICAL: Cranial nerves grossly intact, moves all extremities without gross motor or sensory deficit. SKIN: No significant skin lesions.  SKIN: No rash,no lesion or erythema   PSYCHIATRY/BEHAVIORAL: Mood stable   Labs reviewed: No results for input(s): "NA", "K", "CL", "CO2", "GLUCOSE", "BUN", "CREATININE", "CALCIUM ", "MG", "PHOS" in the last 8760 hours. No results for input(s): "AST", "ALT", "ALKPHOS", "BILITOT", "PROT", "ALBUMIN" in the last 8760 hours. No results for input(s): "WBC", "NEUTROABS", "HGB", "HCT", "MCV", "PLT" in the last 8760 hours. Lab Results  Component Value Date   TSH 2.16 10/12/2022   Lab Results  Component Value Date   HGBA1C 5.5 04/24/2019   Lab Results  Component Value Date   CHOL 233 (H) 10/12/2022   HDL 84 10/12/2022   LDLCALC 117 (H) 10/12/2022   TRIG 205 (H) 10/12/2022   CHOLHDL 2.8 10/12/2022    Significant Diagnostic Results in last 30 days:  No results found.  Assessment/Plan  Wellness Visit Routine six-month follow-up visit. Blood pressure is well-controlled at 124/72 mmHg. Weight has decreased from 148 lbs to 144.4 lbs. No issues with anxiety or depression. No symptoms of Crohn's disease. No significant changes in vision, but needs vision check. No urinary issues or sleep disturbances. No new symptoms related to goiter. No significant skin lesions noted, but follow-up  with dermatologist is needed for full skin exam. - Schedule six-month follow-up appointment. - Order fasting lab work including lipid panel, thyroid  function tests, CBC, and comprehensive metabolic panel. - Schedule bone density scan at the breast center near [facility]. - Order ultrasound for thyroid  to monitor goiter. - Advise to continue current medications including atorvastatin , Wellbutrin , and Celebrex as needed. - Send Valtrex  prescription to [facility]. - Advise to maintain current exercise routine and healthy lifestyle. - Advise to follow up with dermatologist for full skin exam.  Hyperlipidemia Hyperlipidemia with previous total cholesterol of 233 mg/dL, triglycerides 161 mg/dL, and LDL 096 mg/dL. Currently on atorvastatin  20 mg. Reports receiving 10 mg tablets instead of 20 mg from [facility]. Alcohol intake affects triglyceride levels. - Order fasting lipid panel to assess current cholesterol  levels. - Advise to take atorvastatin  20 mg as prescribed. - Advise to avoid alcohol before lab work to get accurate triglyceride levels.  Tendonitis, bursitis, and arthritis Chronic pain due to tendonitis, bursitis, and arthritis. Receives steroid injections every three months for pain management. MRI in November 2024 showed mild degenerative changes in the lumbar spine, particularly at L4-L5 with mild right foraminal stenosis and disc bulge. Steroid injections provide temporary relief, lasting about a month. - Continue with current pain management plan including steroid injections every three months. - Use brace and heating pad as needed for pain relief.  Lumbar degenerative changes with stenosis Mild degenerative changes in the lumbar spine with mild right foraminal stenosis at L4-L5. No canal stenosis. Pain is managed with steroid injections, brace, and heating pad. No radicular symptoms reported. - Continue with current management plan including steroid injections, brace, and  heating pad as needed.  ADHD  well-managed with Wellbutrin  150 mg daily. No current issues with anxiety or depression reported. - Continue Wellbutrin  150 mg daily.  Goiter Goiter with previous ultrasound in 2021 showing mildly heterogeneous thyroid  with right lobe larger than left. No new symptoms reported. - Order thyroid  ultrasound to monitor goiter.  Crohn's disease No current symptoms of Crohn's disease. Previous diagnosis was questioned as symptoms were related to a hernia that was removed.  General Health Maintenance Due for mammogram in October 2025. Last flu shot was in fall 2024. Pneumonia vaccines are  up to date with next dose due in 2026. No COVID-19 vaccine since July 2022. Bone density scan is due. - Schedule mammogram for October 2025. - Advise to get flu shot in fall 2025. - Plan for next pneumonia vaccine in 2026. - Advise to get COVID-19 vaccine at pharmacy. - Schedule bone density scan at breast center near [facility].   Family/ staff Communication: Reviewed plan of care with patient verbalized understanding   Labs/tests ordered:  - CBC with Differential/Platelet - CMP with eGFR([facility]) - TSH - Lipid panel - Bone density  - thyroid  Ultrasound   Next Appointment : Return in about 6 months (around 06/19/2024) for medical mangement of chronic issues., Fasting labs in the morning.   Spent 30 minutes of Face to face and non-face to face with patient  >50% time spent counseling; reviewing medical record; tests; labs; documentation and developing future plan of care.   Estil Heman, NP

## 2023-12-20 ENCOUNTER — Other Ambulatory Visit

## 2023-12-21 DIAGNOSIS — Z79891 Long term (current) use of opiate analgesic: Secondary | ICD-10-CM | POA: Diagnosis not present

## 2023-12-21 DIAGNOSIS — M4727 Other spondylosis with radiculopathy, lumbosacral region: Secondary | ICD-10-CM | POA: Diagnosis not present

## 2023-12-26 ENCOUNTER — Ambulatory Visit
Admission: RE | Admit: 2023-12-26 | Discharge: 2023-12-26 | Disposition: A | Discharge status: Home / Self Care | Source: Ambulatory Visit | Attending: Family | Admitting: Family

## 2023-12-26 DIAGNOSIS — E049 Nontoxic goiter, unspecified: Secondary | ICD-10-CM | POA: Diagnosis not present

## 2024-01-02 ENCOUNTER — Ambulatory Visit: Payer: Self-pay | Admitting: Family

## 2024-01-09 DIAGNOSIS — D229 Melanocytic nevi, unspecified: Secondary | ICD-10-CM | POA: Diagnosis not present

## 2024-01-09 DIAGNOSIS — C44219 Basal cell carcinoma of skin of left ear and external auricular canal: Secondary | ICD-10-CM | POA: Diagnosis not present

## 2024-01-09 DIAGNOSIS — L821 Other seborrheic keratosis: Secondary | ICD-10-CM | POA: Diagnosis not present

## 2024-01-09 DIAGNOSIS — D485 Neoplasm of uncertain behavior of skin: Secondary | ICD-10-CM | POA: Diagnosis not present

## 2024-01-09 DIAGNOSIS — L578 Other skin changes due to chronic exposure to nonionizing radiation: Secondary | ICD-10-CM | POA: Diagnosis not present

## 2024-01-09 DIAGNOSIS — L814 Other melanin hyperpigmentation: Secondary | ICD-10-CM | POA: Diagnosis not present

## 2024-01-11 DIAGNOSIS — M4727 Other spondylosis with radiculopathy, lumbosacral region: Secondary | ICD-10-CM | POA: Diagnosis not present

## 2024-01-24 ENCOUNTER — Other Ambulatory Visit: Payer: Self-pay | Admitting: Family

## 2024-01-24 DIAGNOSIS — G43909 Migraine, unspecified, not intractable, without status migrainosus: Secondary | ICD-10-CM

## 2024-01-24 NOTE — Telephone Encounter (Signed)
 High risk warning

## 2024-02-21 ENCOUNTER — Ambulatory Visit: Admitting: Sports Medicine

## 2024-02-21 ENCOUNTER — Ambulatory Visit: Payer: Self-pay

## 2024-02-21 VITALS — BP 122/88 | HR 80 | Temp 97.9°F | Ht 65.0 in | Wt 142.2 lb

## 2024-02-21 DIAGNOSIS — E0789 Other specified disorders of thyroid: Secondary | ICD-10-CM | POA: Diagnosis not present

## 2024-02-21 MED ORDER — PREDNISONE 20 MG PO TABS: 40.0000 mg | ORAL_TABLET | Freq: Every day | ORAL | 0 refills | Status: DC

## 2024-02-21 NOTE — Progress Notes (Signed)
 Careteam: Patient Care Team: Ngetich, Roxan BROCKS, NP as PCP - General (Family Medicine) Julieanne Eck, MD as Consulting Physician (Anesthesiology) Cloria Setter, MD as Referring Physician (Student) Armbruster, Elspeth SQUIBB, MD as Consulting Physician (Gastroenterology)  PLACE OF SERVICE:  [facility]  Advanced Directive information    Allergies  Allergen Reactions   Etodolac Other (See Comments)    Sleepiness, feeling out of it Sleepiness, feeling out of it    Tizanidine Hcl Other (See Comments)    Hallucinations.   Naproxen Other (See Comments)    flu like symptoms    Chief Complaint  Patient presents with   Acute Visit    Patient has been having neck pain.  When patient takes a deep breath the pain gets worse. Pt stated it started yesterday afternoon , pt has taken migraine medication between yesterday and this morning. / Right side shoulder and arm is in pain as well also started.      Discussed the use of AI scribe software for clinical note transcription with the patient, who gave verbal consent to proceed.  History of Present Illness    Hailey Galvan is a 68 year old female presents with neck pain since yesterday  She began experiencing a strange sensation at the base of her neck yesterday afternoon, with pain radiating to her chest, right arm, and shoulder. The pain intensifies with deep breaths, particularly in the neck area, and has disrupted her sleep as she cannot lie on her usual side. No fever, nasal congestion, sore throat, or left-sided chest pain. No recent trauma or changes in energy levels, although she feels 'always tired.'  She has a history of esophagitis but states that this pain is different. She has been experiencing diarrhea for the past four days, with approximately two bowel movements per day, but denies blood in the stool, abdominal cramping, nausea, or vomiting. She has not been diagnosed with Crohn's disease but mentions occasional  diarrhea.  For pain relief, she took ibuprofen and Advil, which provided some relief. She also took a beta-blocker for a migraine that started yesterday. She has arthritis, bursitis, and tendonitis in her hips, causing constant pain, and wonders if the neck pain could be related.  She is scheduled to travel to [place]  tomorrow for a family event and is concerned about her symptoms. She regularly takes vitamin D  and vitamin B12 but did not take them today.  Review of Systems:  Review of Systems  Constitutional:  Negative for chills and fever.  HENT:  Negative for congestion and sore throat.   Eyes:  Negative for double vision.  Respiratory:  Negative for cough, sputum production and shortness of breath.   Cardiovascular:  Negative for chest pain, palpitations and leg swelling.  Gastrointestinal:  Positive for diarrhea. Negative for abdominal pain, heartburn and nausea.  Genitourinary:  Negative for dysuria, frequency and hematuria.  Musculoskeletal:  Positive for neck pain. Negative for falls and myalgias.  Neurological:  Negative for dizziness.   Negative unless indicated in HPI.   Past Medical History:  Diagnosis Date   ADHD    Allergy    Basal cell carcinoma (BCC) of antihelix of ear    Crohn disease (HCC)    Depression .   Hiatal hernia    Ileitis    Past Surgical History:  Procedure Laterality Date   ABDOMINAL HYSTERECTOMY     BREAST EXCISIONAL BIOPSY Right    BREAST SURGERY     Tumor Removal   CESAREAN SECTION  COLONOSCOPY  last 03/30/2018   at [facility]=polyps   KNEE SURGERY     TONSILLECTOMY     Social History:   reports that she has been smoking cigarettes. She has a 1.3 pack-year smoking history. She has never used smokeless tobacco. She reports current alcohol use of about 14.0 standard drinks of alcohol per week. She reports that she does not use drugs.  Family History  Problem Relation Age of Onset   Alcohol abuse Mother    Stroke Mother    Diabetes Father     Depression Paternal Grandmother    Colon cancer Neg Hx    Esophageal cancer Neg Hx    Rectal cancer Neg Hx    Stomach cancer Neg Hx    Colon polyps Neg Hx     Medications: Patient's Medications  New Prescriptions   No medications on file  Previous Medications   AMMONIUM LACTATE  (LAC-HYDRIN ) 12 % LOTION    Apply 1 application topically as needed for dry skin.   ATORVASTATIN  (LIPITOR) 20 MG TABLET    Take 1 tablet (20 mg total) by mouth daily.   BUPROPION  HCL PO    Take 150 mg by mouth daily.   BUTALBITAL -ACETAMINOPHEN -CAFFEINE  (FIORICET) 50-325-40 MG TABLET    TAKE 1 TABLET BY MOUTH EVERY 6 HOURS AS NEEDED FOR HEADACHE   CELECOXIB (CELEBREX) 100 MG CAPSULE    Take 100 mg by mouth as needed.   CHLORPHENIRAMINE-HYDROCODONE  (TUSSIONEX) 10-8 MG/5ML    TAKE 5 mls BY MOUTH EVERY TWELVE HOURS AS NEEDED FOR cough   VALACYCLOVIR  (VALTREX ) 1000 MG TABLET    Take 1 tablet (1,000 mg total) by mouth 2 (two) times daily as needed.  Modified Medications   No medications on file  Discontinued Medications   No medications on file    Physical Exam: Vitals:   02/21/24 1311  BP: 122/88  Pulse: 80  Temp: 97.9 F (36.6 C)  SpO2: 97%  Weight: 142 lb 3.2 oz (64.5 kg)  Height: 5' 5 (1.651 m)   Body mass index is 23.66 kg/m. BP Readings from Last 3 Encounters:  02/21/24 122/88  12/19/23 124/72  05/30/23 128/80   Wt Readings from Last 3 Encounters:  02/21/24 142 lb 3.2 oz (64.5 kg)  12/19/23 144 lb 6.4 oz (65.5 kg)  05/30/23 148 lb (67.1 kg)    Physical Exam Constitutional:      Appearance: Normal appearance.  HENT:     Head: Normocephalic and atraumatic.  Cardiovascular:     Rate and Rhythm: Normal rate and regular rhythm.  Pulmonary:     Effort: Pulmonary effort is normal. No respiratory distress.     Breath sounds: Normal breath sounds. No wheezing.  Abdominal:     General: Bowel sounds are normal. There is no distension.     Tenderness: There is no abdominal tenderness. There  is no guarding or rebound.     Comments:    Musculoskeletal:        General: No swelling.     Comments: Thyroid  tenderness No palpable mass No enlargement  Neurological:     Mental Status: She is alert. Mental status is at baseline.     Motor: No weakness.     Labs reviewed: Basic Metabolic Panel: No results for input(s): NA, K, CL, CO2, GLUCOSE, BUN, CREATININE, CALCIUM , MG, PHOS, TSH in the last 8760 hours. Liver Function Tests: No results for input(s): AST, ALT, ALKPHOS, BILITOT, PROT, ALBUMIN in the last 8760 hours. No results for input(s): LIPASE,  AMYLASE in the last 8760 hours. No results for input(s): AMMONIA in the last 8760 hours. CBC: No results for input(s): WBC, NEUTROABS, HGB, HCT, MCV, PLT in the last 8760 hours. Lipid Panel: No results for input(s): CHOL, HDL, LDLCALC, TRIG, CHOLHDL, LDLDIRECT in the last 8760 hours. TSH: No results for input(s): TSH in the last 8760 hours. A1C: Lab Results  Component Value Date   HGBA1C 5.5 04/24/2019    Assessment and Plan Assessment & Plan   1. Thyroid  pain (Primary)  Thyroid  tenderness No enlargement on palpation  Will order TSH, free T4, T3, esr, crp Will give prednisone  40 mg daily x 5 days Instructed patient to go to ED if her symptoms worsens - TSH - T3 - T4, Free - Sedimentation Rate - C-reactive Protein - predniSONE  (DELTASONE ) 20 MG tablet; Take 2 tablets (40 mg total) by mouth daily with breakfast.  Dispense: 10 tablet; Refill: 0    30 minTotal time spent for obtaining history,  performing a medically appropriate examination and evaluation, reviewing the tests,ordering  tests,  documenting clinical information in the electronic or other health record,   ,care coordination (not separately reported)

## 2024-02-21 NOTE — Telephone Encounter (Signed)
 FYI Only or Action Required?: Action required by provider: request for appointment.  Patient was last seen in primary care on 12/19/2023 by Ngetich, Roxan BROCKS, NP. Called Nurse Triage reporting Neck Pain. Symptoms began yesterday. Interventions attempted: Prescription medications: migraine medication. Symptoms are: unchanged.  Triage Disposition: See Physician Within 24 Hours  Patient/caregiver understands and will follow disposition?: YesCopied from CRM [id number]. Topic: Clinical - Red Word Triage >> Feb 21, 2024  9:53 AM Mercer PEDLAR wrote: Red Word that prompted transfer to Nurse Triage: Hurts when breathing, strange feeling in neck area and right shoulder Reason for Disposition  Tenderness or swelling of front of neck over windpipe  Answer Assessment - Initial Assessment Questions 1. ONSET: When did the pain begin?      Yesterday  2. LOCATION: Where does it hurt?      Trachea area  3. PATTERN Does the pain come and go, or has it been constant since it started?      Comes and goes 4. SEVERITY: How bad is the pain?  (Scale 1-10; or mild, moderate, severe)   - NO PAIN (0): no pain or only slight stiffness    - MILD (1-3): doesn't interfere with normal activities    - MODERATE (4-7): interferes with normal activities or awakens from sleep    - SEVERE (8-10):  excruciating pain, unable to do any normal activities      5 5. RADIATION: Does the pain go anywhere else, shoot into your arms?     Down into stomach  6. CORD SYMPTOMS: Any weakness or numbness of the arms or legs?     Denies  7. CAUSE: What do you think is causing the neck pain?     Not sure 8. NECK OVERUSE: Any recent activities that involved turning or twisting the neck?     Denies  9. OTHER SYMPTOMS: Do you have any other symptoms? (e.g., headache, fever, chest pain, difficulty breathing, neck swelling)     Hurts to take deep breath   Pt has taken migraine medication for pain. No relief. Pt denied any trouble  breathing/airway closing. Pt stated it hurts to touch trachea and when I take a deep breath the pain shoots down into stomach.  Protocols used: Neck Pain or Stiffness-A-AH

## 2024-02-21 NOTE — Telephone Encounter (Signed)
 Appointment scheduled for 02/21/2024 for evaluation.

## 2024-02-22 ENCOUNTER — Ambulatory Visit: Payer: Self-pay | Admitting: Sports Medicine

## 2024-02-22 LAB — T4, FREE: Free T4: 1 ng/dL (ref 0.8–1.8)

## 2024-02-22 LAB — C-REACTIVE PROTEIN: CRP: 13.5 mg/L — ABNORMAL HIGH (ref <8.0)

## 2024-02-22 LAB — TSH: TSH: 1.61 m[IU]/L (ref 0.40–4.50)

## 2024-02-22 LAB — T3: T3, Total: 82 ng/dL (ref 76–181)

## 2024-02-22 LAB — SEDIMENTATION RATE: Sed Rate: 25 mm/h (ref 0–30)

## 2024-03-13 DIAGNOSIS — C44219 Basal cell carcinoma of skin of left ear and external auricular canal: Secondary | ICD-10-CM | POA: Diagnosis not present

## 2024-03-18 DIAGNOSIS — F900 Attention-deficit hyperactivity disorder, predominantly inattentive type: Secondary | ICD-10-CM | POA: Diagnosis not present

## 2024-03-18 DIAGNOSIS — Z79899 Other long term (current) drug therapy: Secondary | ICD-10-CM | POA: Diagnosis not present

## 2024-03-19 DIAGNOSIS — Z79899 Other long term (current) drug therapy: Secondary | ICD-10-CM | POA: Diagnosis not present

## 2024-03-19 DIAGNOSIS — F902 Attention-deficit hyperactivity disorder, combined type: Secondary | ICD-10-CM | POA: Diagnosis not present

## 2024-04-11 DIAGNOSIS — E7800 Pure hypercholesterolemia, unspecified: Secondary | ICD-10-CM | POA: Diagnosis not present

## 2024-04-11 DIAGNOSIS — K5903 Drug induced constipation: Secondary | ICD-10-CM | POA: Diagnosis not present

## 2024-04-11 DIAGNOSIS — R0602 Shortness of breath: Secondary | ICD-10-CM | POA: Diagnosis not present

## 2024-04-11 DIAGNOSIS — Z79899 Other long term (current) drug therapy: Secondary | ICD-10-CM | POA: Diagnosis not present

## 2024-04-11 DIAGNOSIS — D539 Nutritional anemia, unspecified: Secondary | ICD-10-CM | POA: Diagnosis not present

## 2024-04-11 DIAGNOSIS — Z131 Encounter for screening for diabetes mellitus: Secondary | ICD-10-CM | POA: Diagnosis not present

## 2024-04-11 DIAGNOSIS — T402X5A Adverse effect of other opioids, initial encounter: Secondary | ICD-10-CM | POA: Diagnosis not present

## 2024-04-11 DIAGNOSIS — M129 Arthropathy, unspecified: Secondary | ICD-10-CM | POA: Diagnosis not present

## 2024-04-11 DIAGNOSIS — R5383 Other fatigue: Secondary | ICD-10-CM | POA: Diagnosis not present

## 2024-04-11 DIAGNOSIS — Z1159 Encounter for screening for other viral diseases: Secondary | ICD-10-CM | POA: Diagnosis not present

## 2024-04-11 DIAGNOSIS — M549 Dorsalgia, unspecified: Secondary | ICD-10-CM | POA: Diagnosis not present

## 2024-04-11 DIAGNOSIS — E559 Vitamin D deficiency, unspecified: Secondary | ICD-10-CM | POA: Diagnosis not present

## 2024-04-16 DIAGNOSIS — Z79899 Other long term (current) drug therapy: Secondary | ICD-10-CM | POA: Diagnosis not present

## 2024-04-23 DIAGNOSIS — E781 Pure hyperglyceridemia: Secondary | ICD-10-CM | POA: Diagnosis not present

## 2024-04-23 DIAGNOSIS — M48061 Spinal stenosis, lumbar region without neurogenic claudication: Secondary | ICD-10-CM | POA: Diagnosis not present

## 2024-04-23 DIAGNOSIS — Z79899 Other long term (current) drug therapy: Secondary | ICD-10-CM | POA: Diagnosis not present

## 2024-04-23 DIAGNOSIS — E7800 Pure hypercholesterolemia, unspecified: Secondary | ICD-10-CM | POA: Diagnosis not present

## 2024-04-23 DIAGNOSIS — E559 Vitamin D deficiency, unspecified: Secondary | ICD-10-CM | POA: Diagnosis not present

## 2024-04-29 DIAGNOSIS — Z79899 Other long term (current) drug therapy: Secondary | ICD-10-CM | POA: Diagnosis not present

## 2024-05-17 DIAGNOSIS — E7800 Pure hypercholesterolemia, unspecified: Secondary | ICD-10-CM | POA: Diagnosis not present

## 2024-05-17 DIAGNOSIS — E781 Pure hyperglyceridemia: Secondary | ICD-10-CM | POA: Diagnosis not present

## 2024-05-17 DIAGNOSIS — M48061 Spinal stenosis, lumbar region without neurogenic claudication: Secondary | ICD-10-CM | POA: Diagnosis not present

## 2024-05-17 DIAGNOSIS — E559 Vitamin D deficiency, unspecified: Secondary | ICD-10-CM | POA: Diagnosis not present

## 2024-05-17 DIAGNOSIS — Z79899 Other long term (current) drug therapy: Secondary | ICD-10-CM | POA: Diagnosis not present

## 2024-05-22 DIAGNOSIS — Z79899 Other long term (current) drug therapy: Secondary | ICD-10-CM | POA: Diagnosis not present

## 2024-06-11 DIAGNOSIS — M48061 Spinal stenosis, lumbar region without neurogenic claudication: Secondary | ICD-10-CM | POA: Diagnosis not present

## 2024-06-11 DIAGNOSIS — E559 Vitamin D deficiency, unspecified: Secondary | ICD-10-CM | POA: Diagnosis not present

## 2024-06-11 DIAGNOSIS — E781 Pure hyperglyceridemia: Secondary | ICD-10-CM | POA: Diagnosis not present

## 2024-06-11 DIAGNOSIS — E7800 Pure hypercholesterolemia, unspecified: Secondary | ICD-10-CM | POA: Diagnosis not present

## 2024-06-17 DIAGNOSIS — Z79899 Other long term (current) drug therapy: Secondary | ICD-10-CM | POA: Diagnosis not present

## 2024-06-19 ENCOUNTER — Ambulatory Visit: Admitting: Family

## 2024-07-15 DIAGNOSIS — E7800 Pure hypercholesterolemia, unspecified: Secondary | ICD-10-CM | POA: Diagnosis not present

## 2024-07-15 DIAGNOSIS — E781 Pure hyperglyceridemia: Secondary | ICD-10-CM | POA: Diagnosis not present

## 2024-07-15 DIAGNOSIS — M48061 Spinal stenosis, lumbar region without neurogenic claudication: Secondary | ICD-10-CM | POA: Diagnosis not present

## 2024-07-15 DIAGNOSIS — E559 Vitamin D deficiency, unspecified: Secondary | ICD-10-CM | POA: Diagnosis not present

## 2024-07-15 DIAGNOSIS — Z79899 Other long term (current) drug therapy: Secondary | ICD-10-CM | POA: Diagnosis not present

## 2024-07-19 DIAGNOSIS — Z79899 Other long term (current) drug therapy: Secondary | ICD-10-CM | POA: Diagnosis not present

## 2024-08-13 DIAGNOSIS — M48061 Spinal stenosis, lumbar region without neurogenic claudication: Secondary | ICD-10-CM | POA: Diagnosis not present

## 2024-08-13 DIAGNOSIS — Z79899 Other long term (current) drug therapy: Secondary | ICD-10-CM | POA: Diagnosis not present

## 2024-08-20 DIAGNOSIS — Z79899 Other long term (current) drug therapy: Secondary | ICD-10-CM | POA: Diagnosis not present

## 2024-09-13 ENCOUNTER — Other Ambulatory Visit

## 2024-09-26 ENCOUNTER — Ambulatory Visit: Admitting: Family

## 2024-09-26 ENCOUNTER — Telehealth: Payer: Self-pay

## 2024-09-26 VITALS — BP 124/68 | HR 74 | Temp 97.7°F | Resp 18 | Ht 65.0 in | Wt 146.0 lb

## 2024-09-26 DIAGNOSIS — Z1231 Encounter for screening mammogram for malignant neoplasm of breast: Secondary | ICD-10-CM

## 2024-09-26 DIAGNOSIS — B9689 Other specified bacterial agents as the cause of diseases classified elsewhere: Secondary | ICD-10-CM

## 2024-09-26 DIAGNOSIS — R051 Acute cough: Secondary | ICD-10-CM

## 2024-09-26 DIAGNOSIS — Z1152 Encounter for screening for COVID-19: Secondary | ICD-10-CM

## 2024-09-26 DIAGNOSIS — E2839 Other primary ovarian failure: Secondary | ICD-10-CM

## 2024-09-26 LAB — POCT INFLUENZA A/B
Influenza A, POC: NEGATIVE
Influenza B, POC: NEGATIVE

## 2024-09-26 LAB — POC COVID19 BINAXNOW: SARS Coronavirus 2 Ag: NEGATIVE

## 2024-09-26 MED ORDER — HYDROCOD POLI-CHLORPHE POLI ER 10-8 MG/5ML PO SUER: 5.0000 mL | Freq: Two times a day (BID) | ORAL | 0 refills | Status: AC | PRN

## 2024-09-26 MED ORDER — AZITHROMYCIN 250 MG PO TABS: ORAL_TABLET | ORAL | 0 refills | Status: AC

## 2024-09-26 NOTE — Telephone Encounter (Signed)
 Tillman with [facility] called to question the rx for Tussionex since it contains hydrocodone  and patient is also prescribed hydrocodone  7.5mg -325 mg three times daily prescribed by pain clinic specialist     Per Dinah:  she is taking twice daily that;s okay (message sent via secure chat)   Tillman aware and will notate for patient to not take both medications at the same time
